# Patient Record
Sex: Female | Born: 1950 | Race: White | Hispanic: No | Smoking: Never smoker
Health system: Southern US, Community
[De-identification: ages and names within clinical notes are randomized; demographics above are authoritative.]

## PROBLEM LIST (undated history)

## (undated) DIAGNOSIS — F419 Anxiety disorder, unspecified: Secondary | ICD-10-CM

## (undated) DIAGNOSIS — J189 Pneumonia, unspecified organism: Secondary | ICD-10-CM

## (undated) DIAGNOSIS — M797 Fibromyalgia: Secondary | ICD-10-CM

## (undated) DIAGNOSIS — F909 Attention-deficit hyperactivity disorder, unspecified type: Secondary | ICD-10-CM

## (undated) DIAGNOSIS — R112 Nausea with vomiting, unspecified: Secondary | ICD-10-CM

## (undated) DIAGNOSIS — M75100 Unspecified rotator cuff tear or rupture of unspecified shoulder, not specified as traumatic: Secondary | ICD-10-CM

## (undated) DIAGNOSIS — R519 Headache, unspecified: Secondary | ICD-10-CM

## (undated) DIAGNOSIS — M1990 Unspecified osteoarthritis, unspecified site: Secondary | ICD-10-CM

## (undated) DIAGNOSIS — R51 Headache: Secondary | ICD-10-CM

## (undated) DIAGNOSIS — Z9889 Other specified postprocedural states: Secondary | ICD-10-CM

## (undated) DIAGNOSIS — M545 Low back pain, unspecified: Secondary | ICD-10-CM

## (undated) DIAGNOSIS — G8929 Other chronic pain: Secondary | ICD-10-CM

## (undated) HISTORY — PX: APPENDECTOMY: SHX54

## (undated) HISTORY — PX: CYST EXCISION: SHX5701

## (undated) HISTORY — PX: TONSILLECTOMY: SUR1361

## (undated) HISTORY — PX: DILATION AND CURETTAGE OF UTERUS: SHX78

## (undated) HISTORY — PX: BACK SURGERY: SHX140

## (undated) HISTORY — PX: CARPAL TUNNEL RELEASE: SHX101

## (undated) HISTORY — PX: VAGINAL HYSTERECTOMY: SUR661

## (undated) HISTORY — PX: CYSTECTOMY: SUR359

## (undated) HISTORY — PX: TUBAL LIGATION: SHX77

## (undated) HISTORY — PX: SHOULDER OPEN ROTATOR CUFF REPAIR: SHX2407

---

## 2012-09-20 ENCOUNTER — Encounter (HOSPITAL_COMMUNITY): Payer: Self-pay | Admitting: Pharmacy Technician

## 2012-09-24 NOTE — Pre-Procedure Instructions (Signed)
Laura Solis  09/24/2012   Your procedure is scheduled on:  10/02/12  Report to [facility] at 530 AM.  Call this number if you have problems the morning of surgery: [phone]   Remember:   Do not eat food or drink liquids after midnight.   Take these medicines the morning of surgery with A SIP OF WATER: xanax,adderall,hydrocodone   Do not wear jewelry, make-up or nail polish.  Do not wear lotions, powders, or perfumes. You may wear deodorant.  Do not shave 48 hours prior to surgery. Men may shave face and neck.  Do not bring valuables to the hospital.  Contacts, dentures or bridgework may not be worn into surgery.  Leave suitcase in the car. After surgery it may be brought to your room.  For patients admitted to the hospital, checkout time is 11:00 AM the day of  discharge.   Patients discharged the day of surgery will not be allowed to drive  home.  Name and phone number of your driver: family  Special Instructions: Shower using CHG 2 nights before surgery and the night before surgery.  If you shower the day of surgery use CHG.  Use special wash - you have one bottle of CHG for all showers.  You should use approximately 1/3 of the bottle for each shower.   Please read over the following fact sheets that you were given: Pain Booklet, Total Joint Packet, MRSA Information and Surgical Site Infection Prevention

## 2012-09-25 ENCOUNTER — Encounter (HOSPITAL_COMMUNITY)
Admission: RE | Admit: 2012-09-25 | Discharge: 2012-09-25 | Disposition: A | Discharge status: Home / Self Care | Payer: PRIVATE HEALTH INSURANCE | Source: Ambulatory Visit | Attending: Orthopedic Surgery | Admitting: Orthopedic Surgery

## 2012-09-25 ENCOUNTER — Encounter (HOSPITAL_COMMUNITY)
Admission: RE | Admit: 2012-09-25 | Discharge: 2012-09-25 | Disposition: A | Discharge status: Home / Self Care | Payer: PRIVATE HEALTH INSURANCE | Source: Ambulatory Visit | Attending: Orthopaedic Surgery | Admitting: Orthopaedic Surgery

## 2012-09-25 ENCOUNTER — Encounter (HOSPITAL_COMMUNITY): Payer: Self-pay

## 2012-09-25 HISTORY — DX: Anxiety disorder, unspecified: F41.9

## 2012-09-25 HISTORY — DX: Fibromyalgia: M79.7

## 2012-09-25 HISTORY — DX: Attention-deficit hyperactivity disorder, unspecified type: F90.9

## 2012-09-25 HISTORY — DX: Other specified postprocedural states: R11.2

## 2012-09-25 HISTORY — DX: Other specified postprocedural states: Z98.890

## 2012-09-25 LAB — URINALYSIS, ROUTINE W REFLEX MICROSCOPIC
Glucose, UA: NEGATIVE mg/dL
Leukocytes, UA: NEGATIVE
Nitrite: NEGATIVE
Protein, ur: NEGATIVE mg/dL
Urobilinogen, UA: 0.2 mg/dL (ref 0.0–1.0)

## 2012-09-25 LAB — ABO/RH: ABO/RH(D): A POS

## 2012-09-25 LAB — COMPREHENSIVE METABOLIC PANEL
ALT: 24 U/L (ref 0–35)
AST: 25 U/L (ref 0–37)
Albumin: 3.8 g/dL (ref 3.5–5.2)
CO2: 30 mEq/L (ref 19–32)
Calcium: 9.7 mg/dL (ref 8.4–10.5)
Chloride: 99 mEq/L (ref 96–112)
GFR calc non Af Amer: >90 mL/min (ref >90)
Sodium: 139 mEq/L (ref 135–145)

## 2012-09-25 LAB — SURGICAL PCR SCREEN: MRSA, PCR: POSITIVE — AB

## 2012-09-25 LAB — CBC
MCH: 32.4 pg (ref 26.0–34.0)
Platelets: 262 10*3/uL (ref 150–400)
RBC: 4.51 MIL/uL (ref 3.87–5.11)
WBC: 6.7 10*3/uL (ref 4.0–10.5)

## 2012-09-25 LAB — TYPE AND SCREEN: ABO/RH(D): A POS

## 2012-09-25 NOTE — Progress Notes (Signed)
Pt sees dr Michel Santee  [phone]

## 2012-09-27 NOTE — H&P (Signed)
CHIEF COMPLAINT:  Painful right knee.    HISTORY OF PRESENT ILLNESS:  Laura Solis is a very pleasant 62 year old white female who is seen today for evaluation of her right knee.  History is that she has been having persistent right knee pain, which appears to be worsening.  In February of 2011, she did have a lateral meniscectomy, a 3 compartment chondroplasty performed by Dr. Priscille Solis.  She had rather significant OA at the time as well as the tear, lateral meniscus.  He did chondroplasty of all 3 joints.  Over time, though, she has been having increasing pain and discomfort in the right knee to the point now where she cannot straighten the knee out fully and she is also having a significant arthritic aching-type pain as well as sharp stabbing pains.  She is now having problems with activities of daily living as well as with sleep.  Activity worsens her symptoms.  Rest has been somewhat helpful.  She is taking hydrocodone 10/325 up to 3 a day as well as Xanax and Cymbalta.  She does have a history of fibromyalgia but this pain is certainly more debilitating in comparison to her fibromyalgia.  She has been tried with corticosteroid injections as well as viscosupplementation and is presently having less effect.  Seen today for evaluation.     PAST MEDICAL HISTORY:  In general, health is fair.  She also has 2 children and was hospitalized for that as well as pneumonia.   PAST SURGICAL HISTORY:  Includes hysterectomy, bilateral carpal tunnel releases, bilateral rotator cuff tear repair, excision of a growth posterior and inferior to her right ear, pilonidal cystectomy, tubal ligation and appendectomy.       CURRENT MEDICATIONS:  Include that of: 1.  Hydrocodone 10/350 one p.o. b.i.d. to t.i.d.   2.  Xanax 0.5 mg p.r.n.  3.  Cymbalta. 4.  Adderall 20 mg b.i.d.  5.  Pramipexole dihydrochloride 0.125.     ALLERGIES:  NONE KNOWN.    FAMILY HISTORY:  Positive for a mother who remains alive at age 11, who has had  high blood pressure, stroke x1, and ovarian cancer.  Father is alive at age 52 and does have gout and hypertension.  Brothers, none.  Sisters, she has one, age 73, and is fairly healthy.     SOCIAL HISTORY:  She is a 62 year old white, divorced, disabled female.  Denies use of tobacco or alcohol.     REVIEW OF SYSTEMS:  A 14-point review of systems is positive for upper partial.  She has had pneumonia and bronchitis about 3-4 years ago.  None recent.  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  Leg cramps are also noted.  She complains of chronic diarrhea and has been told to her by her medical physician this on the basis of her medications that she takes.  She does have internal hemorrhoids.  Her last kidney infection was 2 years ago.  She does have fibromyalgia and does get a fair amount of headaches as well as hot spells, depression, nervous exhaustion, nervous tension.  She also has a borderline study for sleep apnea.     PHYSICAL EXAMINATION:  A 62 year old white female, well developed, well nourished, obese, very pleasant, cooperative, in moderate distress secondary to right knee pain.  She is 5 feet, 5 inches.  Weighs 209 pounds.  BMI is 34.8.  Vital signs:  Temperature 98.8, pulse 89, respirations 18, blood pressure 131/76.  Head:  Normocephalic.  Eyes:  Pupils equal,  round and reactive to light and accomodation.  Extraocular movements intact.  Ears, nose, throat:  Benign.  Neck:  Supple.  No thyromegaly.  No bruits.  Chest:  Good expansion.  Lungs:  Clear.  Cardiac:  Regular rhythm and rate.  Normal S1, S2.  Distant heart sounds.  No murmurs were noted.  Pulses:  Trace to 1+, bilateral and symmetric in the lower extremities.  Abdomen:  Obese, soft, nontender.  No masses palpable.  Normal bowel sounds present.  Genitorectal/breast:  Not indicated for an evaluation orthopedically.  CNS:  Oriented x3.  Cranial nerves II-XII grossly intact.  Musculoskeletal:  She has range of motion today from about 5  degrees shy of full extension to about 110 degrees.  She does have some opening with varus and valgus stressing.  She does have normal sensation.  Trace to 1+ effusion.  Calf is supple, nontender.     RADIOGRAPHS:  Three-view knee reveals patellofemoral OA.  There is sclerosis of the medial femoral condyle and medial tibial plateau with periarticular spurrings more medial than lateral.  She has patellar spurring also.     CLINICAL IMPRESSION:   1.  End-stage OA of the right knee.  2.  Possible sleep apnea, borderline on study.  3.  Fibromyalgia. 4.  Chronic use of hydrocodone.    RECOMMENDATIONS:   1.  At this time, I have reviewed a form from Laura Solis in regard to Laura Solis where he feels that she is medically stable for total knee replacement.   2.  We will proceed with scheduling her for a right total knee arthroplasty.  Procedure risks and benefits were reviewed in detail and she is understanding.  Models were used to show the procedure.  All questions were answered in detail.  She will proceed with total knee replacement in the near future.    Laura Drone Aleda Grana [facility] [phone]  10/01/2012 4:04 PM

## 2012-10-01 MED ORDER — SODIUM CHLORIDE 0.9 % IV SOLN: INTRAVENOUS | Status: DC

## 2012-10-01 MED ORDER — CHLORHEXIDINE GLUCONATE 4 % EX LIQD: 60.0000 mL | Freq: Every day | CUTANEOUS | Status: DC

## 2012-10-01 MED ORDER — CHLORHEXIDINE GLUCONATE 4 % EX LIQD
60.0000 mL | Freq: Once | CUTANEOUS | Status: DC
Start: 2012-10-01 — End: 2012-10-02

## 2012-10-01 MED ORDER — CEFAZOLIN SODIUM-DEXTROSE 2-3 GM-% IV SOLR
2.0000 g | INTRAVENOUS | Status: AC
  Administered 2012-10-02: 2 g via INTRAVENOUS
  Filled 2012-10-01: qty 50

## 2012-10-02 ENCOUNTER — Inpatient Hospital Stay (HOSPITAL_COMMUNITY): Payer: PRIVATE HEALTH INSURANCE | Admitting: Anesthesiology

## 2012-10-02 ENCOUNTER — Inpatient Hospital Stay (HOSPITAL_COMMUNITY)
Admission: RE | Admit: 2012-10-02 | Discharge: 2012-10-04 | DRG: 470 | Disposition: A | Discharge status: Skilled Nursing Facility | Payer: PRIVATE HEALTH INSURANCE | Source: Ambulatory Visit | Attending: Orthopaedic Surgery | Admitting: Orthopaedic Surgery

## 2012-10-02 ENCOUNTER — Encounter (HOSPITAL_COMMUNITY): Payer: Self-pay | Admitting: Anesthesiology

## 2012-10-02 ENCOUNTER — Encounter (HOSPITAL_COMMUNITY)
Admission: RE | Disposition: A | Discharge status: Skilled Nursing Facility | Payer: Self-pay | Source: Ambulatory Visit | Attending: Orthopaedic Surgery

## 2012-10-02 DIAGNOSIS — E66811 Obesity, class 1: Secondary | ICD-10-CM | POA: Diagnosis present

## 2012-10-02 DIAGNOSIS — Z8249 Family history of ischemic heart disease and other diseases of the circulatory system: Secondary | ICD-10-CM

## 2012-10-02 DIAGNOSIS — IMO0001 Reserved for inherently not codable concepts without codable children: Secondary | ICD-10-CM | POA: Diagnosis present

## 2012-10-02 DIAGNOSIS — Z823 Family history of stroke: Secondary | ICD-10-CM

## 2012-10-02 DIAGNOSIS — F909 Attention-deficit hyperactivity disorder, unspecified type: Secondary | ICD-10-CM | POA: Diagnosis present

## 2012-10-02 DIAGNOSIS — Z01812 Encounter for preprocedural laboratory examination: Secondary | ICD-10-CM

## 2012-10-02 DIAGNOSIS — Z79899 Other long term (current) drug therapy: Secondary | ICD-10-CM

## 2012-10-02 DIAGNOSIS — M1710 Unilateral primary osteoarthritis, unspecified knee: Principal | ICD-10-CM | POA: Diagnosis present

## 2012-10-02 DIAGNOSIS — E669 Obesity, unspecified: Secondary | ICD-10-CM | POA: Diagnosis present

## 2012-10-02 DIAGNOSIS — F411 Generalized anxiety disorder: Secondary | ICD-10-CM | POA: Diagnosis present

## 2012-10-02 DIAGNOSIS — E876 Hypokalemia: Secondary | ICD-10-CM | POA: Diagnosis not present

## 2012-10-02 DIAGNOSIS — Z7901 Long term (current) use of anticoagulants: Secondary | ICD-10-CM

## 2012-10-02 DIAGNOSIS — M797 Fibromyalgia: Secondary | ICD-10-CM | POA: Diagnosis present

## 2012-10-02 DIAGNOSIS — M1711 Unilateral primary osteoarthritis, right knee: Secondary | ICD-10-CM | POA: Diagnosis present

## 2012-10-02 HISTORY — DX: Pneumonia, unspecified organism: J18.9

## 2012-10-02 HISTORY — PX: TOTAL KNEE ARTHROPLASTY: SHX125

## 2012-10-02 SURGERY — ARTHROPLASTY, KNEE, TOTAL
Anesthesia: General | Site: Knee | Laterality: Right | Wound class: Clean

## 2012-10-02 MED ORDER — OXYCODONE HCL 5 MG PO TABS: 5.0000 mg | ORAL_TABLET | Freq: Once | ORAL | Status: DC | PRN

## 2012-10-02 MED ORDER — LACTATED RINGERS IV SOLN
INTRAVENOUS | Status: DC | PRN
  Administered 2012-10-02 (×3): via INTRAVENOUS

## 2012-10-02 MED ORDER — ONDANSETRON HCL 4 MG/2ML IJ SOLN: 4.0000 mg | Freq: Four times a day (QID) | INTRAMUSCULAR | Status: DC | PRN

## 2012-10-02 MED ORDER — CHLORHEXIDINE GLUCONATE CLOTH 2 % EX PADS
6.0000 | MEDICATED_PAD | Freq: Every day | CUTANEOUS | Status: DC
  Administered 2012-10-03 – 2012-10-04 (×2): 6 via TOPICAL

## 2012-10-02 MED ORDER — ALPRAZOLAM 0.25 MG PO TABS: 0.2500 mg | ORAL_TABLET | Freq: Two times a day (BID) | ORAL | Status: DC | PRN

## 2012-10-02 MED ORDER — HYDROMORPHONE HCL PF 1 MG/ML IJ SOLN
INTRAMUSCULAR | Status: AC
  Filled 2012-10-02: qty 1

## 2012-10-02 MED ORDER — METHOCARBAMOL 500 MG PO TABS
500.0000 mg | ORAL_TABLET | Freq: Four times a day (QID) | ORAL | Status: DC | PRN
  Administered 2012-10-03: 500 mg via ORAL
  Filled 2012-10-02: qty 1

## 2012-10-02 MED ORDER — MENTHOL 3 MG MT LOZG: 1.0000 | LOZENGE | OROMUCOSAL | Status: DC | PRN

## 2012-10-02 MED ORDER — SODIUM CHLORIDE 0.9 % IR SOLN
Status: DC | PRN
  Administered 2012-10-02: 3000 mL
  Administered 2012-10-02: 1000 mL

## 2012-10-02 MED ORDER — BUPIVACAINE-EPINEPHRINE 0.25% -1:200000 IJ SOLN
INTRAMUSCULAR | Status: DC | PRN
  Administered 2012-10-02: 30 mL

## 2012-10-02 MED ORDER — ACETAMINOPHEN 10 MG/ML IV SOLN
1000.0000 mg | Freq: Four times a day (QID) | INTRAVENOUS | Status: AC
  Administered 2012-10-02 – 2012-10-03 (×4): 1000 mg via INTRAVENOUS
  Filled 2012-10-02 (×4): qty 100

## 2012-10-02 MED ORDER — ONDANSETRON HCL 4 MG PO TABS: 4.0000 mg | ORAL_TABLET | Freq: Four times a day (QID) | ORAL | Status: DC | PRN

## 2012-10-02 MED ORDER — METHOCARBAMOL 100 MG/ML IJ SOLN
500.0000 mg | Freq: Four times a day (QID) | INTRAMUSCULAR | Status: DC | PRN
  Filled 2012-10-02: qty 5

## 2012-10-02 MED ORDER — DEXAMETHASONE SODIUM PHOSPHATE 4 MG/ML IJ SOLN
INTRAMUSCULAR | Status: DC | PRN
  Administered 2012-10-02: 8 mg

## 2012-10-02 MED ORDER — RIVAROXABAN 10 MG PO TABS
10.0000 mg | ORAL_TABLET | ORAL | Status: DC
  Administered 2012-10-02 – 2012-10-03 (×2): 10 mg via ORAL
  Filled 2012-10-02 (×5): qty 1

## 2012-10-02 MED ORDER — ONDANSETRON HCL 4 MG/2ML IJ SOLN
INTRAMUSCULAR | Status: DC | PRN
  Administered 2012-10-02: 4 mg via INTRAVENOUS

## 2012-10-02 MED ORDER — MIDAZOLAM HCL 5 MG/5ML IJ SOLN
INTRAMUSCULAR | Status: DC | PRN
  Administered 2012-10-02: 2 mg via INTRAVENOUS

## 2012-10-02 MED ORDER — THROMBIN 20000 UNITS EX KIT
PACK | CUTANEOUS | Status: AC
  Filled 2012-10-02: qty 1

## 2012-10-02 MED ORDER — ACETAMINOPHEN 10 MG/ML IV SOLN
1000.0000 mg | Freq: Once | INTRAVENOUS | Status: AC
  Administered 2012-10-02: 1000 mg via INTRAVENOUS
  Filled 2012-10-02: qty 100

## 2012-10-02 MED ORDER — HYDROMORPHONE HCL PF 1 MG/ML IJ SOLN
0.5000 mg | INTRAMUSCULAR | Status: DC | PRN
  Administered 2012-10-02: 0.5 mg via INTRAVENOUS
  Filled 2012-10-02: qty 1

## 2012-10-02 MED ORDER — ALUM & MAG HYDROXIDE-SIMETH 200-200-20 MG/5ML PO SUSP: 30.0000 mL | ORAL | Status: DC | PRN

## 2012-10-02 MED ORDER — KETOROLAC TROMETHAMINE 30 MG/ML IJ SOLN
INTRAMUSCULAR | Status: AC
  Filled 2012-10-02: qty 1

## 2012-10-02 MED ORDER — OXYCODONE HCL 5 MG PO TABS
5.0000 mg | ORAL_TABLET | ORAL | Status: DC | PRN
  Administered 2012-10-03 – 2012-10-04 (×3): 10 mg via ORAL
  Filled 2012-10-02 (×3): qty 2

## 2012-10-02 MED ORDER — PROMETHAZINE HCL 25 MG/ML IJ SOLN: 6.2500 mg | INTRAMUSCULAR | Status: DC | PRN

## 2012-10-02 MED ORDER — DOCUSATE SODIUM 100 MG PO CAPS
100.0000 mg | ORAL_CAPSULE | Freq: Two times a day (BID) | ORAL | Status: DC
  Administered 2012-10-02 – 2012-10-04 (×5): 100 mg via ORAL
  Filled 2012-10-02 (×6): qty 1

## 2012-10-02 MED ORDER — CEFAZOLIN SODIUM-DEXTROSE 2-3 GM-% IV SOLR
2.0000 g | Freq: Four times a day (QID) | INTRAVENOUS | Status: AC
  Administered 2012-10-02 (×2): 2 g via INTRAVENOUS
  Filled 2012-10-02 (×2): qty 50

## 2012-10-02 MED ORDER — MUPIROCIN 2 % EX OINT
1.0000 "application " | TOPICAL_OINTMENT | Freq: Two times a day (BID) | CUTANEOUS | Status: DC
  Administered 2012-10-02 – 2012-10-04 (×4): 1 via NASAL
  Filled 2012-10-02: qty 22

## 2012-10-02 MED ORDER — KETOROLAC TROMETHAMINE 15 MG/ML IJ SOLN
15.0000 mg | Freq: Four times a day (QID) | INTRAMUSCULAR | Status: AC
  Administered 2012-10-02 (×2): 15 mg via INTRAVENOUS
  Filled 2012-10-02 (×3): qty 1

## 2012-10-02 MED ORDER — OXYCODONE HCL 5 MG/5ML PO SOLN
5.0000 mg | Freq: Once | ORAL | Status: DC | PRN
Start: 2012-10-02 — End: 2012-10-02

## 2012-10-02 MED ORDER — FENTANYL CITRATE 0.05 MG/ML IJ SOLN
INTRAMUSCULAR | Status: DC | PRN
  Administered 2012-10-02 (×3): 50 ug via INTRAVENOUS
  Administered 2012-10-02: 100 ug via INTRAVENOUS

## 2012-10-02 MED ORDER — PHENOL 1.4 % MT LIQD: 1.0000 | OROMUCOSAL | Status: DC | PRN

## 2012-10-02 MED ORDER — ACETAMINOPHEN 10 MG/ML IV SOLN
INTRAVENOUS | Status: AC
  Filled 2012-10-02: qty 100

## 2012-10-02 MED ORDER — SODIUM CHLORIDE 0.9 % IV SOLN
75.0000 mL/h | INTRAVENOUS | Status: DC
  Administered 2012-10-02: 75 mL/h via INTRAVENOUS

## 2012-10-02 MED ORDER — METOCLOPRAMIDE HCL 5 MG/ML IJ SOLN: 5.0000 mg | Freq: Three times a day (TID) | INTRAMUSCULAR | Status: DC | PRN

## 2012-10-02 MED ORDER — PRAMIPEXOLE DIHYDROCHLORIDE 0.25 MG PO TABS
0.2500 mg | ORAL_TABLET | Freq: Every day | ORAL | Status: DC
  Administered 2012-10-03: 0.25 mg via ORAL
  Filled 2012-10-02 (×3): qty 1

## 2012-10-02 MED ORDER — FLEET ENEMA 7-19 GM/118ML RE ENEM: 1.0000 | ENEMA | Freq: Once | RECTAL | Status: AC | PRN

## 2012-10-02 MED ORDER — MORPHINE SULFATE 10 MG/ML IJ SOLN
INTRAMUSCULAR | Status: DC | PRN
  Administered 2012-10-02 (×2): 5 mg via INTRAVENOUS

## 2012-10-02 MED ORDER — HYDROMORPHONE HCL PF 1 MG/ML IJ SOLN
0.2500 mg | INTRAMUSCULAR | Status: DC | PRN
  Administered 2012-10-02: 0.25 mg via INTRAVENOUS
  Administered 2012-10-02: 0.5 mg via INTRAVENOUS
  Administered 2012-10-02: 0.25 mg via INTRAVENOUS

## 2012-10-02 MED ORDER — THROMBIN 20000 UNITS EX KIT
PACK | CUTANEOUS | Status: DC | PRN
  Administered 2012-10-02: 20000 [IU] via TOPICAL

## 2012-10-02 MED ORDER — MAGNESIUM HYDROXIDE 400 MG/5ML PO SUSP: 30.0000 mL | Freq: Every day | ORAL | Status: DC | PRN

## 2012-10-02 MED ORDER — METOCLOPRAMIDE HCL 10 MG PO TABS: 5.0000 mg | ORAL_TABLET | Freq: Three times a day (TID) | ORAL | Status: DC | PRN

## 2012-10-02 MED ORDER — BUPIVACAINE-EPINEPHRINE PF 0.5-1:200000 % IJ SOLN
INTRAMUSCULAR | Status: DC | PRN
  Administered 2012-10-02: 150 mg

## 2012-10-02 MED ORDER — DULOXETINE HCL 60 MG PO CPEP
60.0000 mg | ORAL_CAPSULE | Freq: Every day | ORAL | Status: DC
  Administered 2012-10-02 – 2012-10-03 (×2): 60 mg via ORAL
  Filled 2012-10-02 (×4): qty 1

## 2012-10-02 MED ORDER — LABETALOL HCL 5 MG/ML IV SOLN
INTRAVENOUS | Status: DC | PRN
  Administered 2012-10-02: 5 mg via INTRAVENOUS

## 2012-10-02 MED ORDER — AMPHETAMINE-DEXTROAMPHETAMINE 10 MG PO TABS
10.0000 mg | ORAL_TABLET | Freq: Two times a day (BID) | ORAL | Status: DC
  Administered 2012-10-03: 10 mg via ORAL
  Administered 2012-10-04: 20 mg via ORAL
  Filled 2012-10-02: qty 2
  Filled 2012-10-02: qty 1

## 2012-10-02 MED ORDER — ARTIFICIAL TEARS OP OINT
TOPICAL_OINTMENT | OPHTHALMIC | Status: DC | PRN
  Administered 2012-10-02: 1 via OPHTHALMIC

## 2012-10-02 MED ORDER — BISACODYL 10 MG RE SUPP: 10.0000 mg | Freq: Every day | RECTAL | Status: DC | PRN

## 2012-10-02 MED ORDER — PROPOFOL 10 MG/ML IV BOLUS
INTRAVENOUS | Status: DC | PRN
  Administered 2012-10-02: 200 mg via INTRAVENOUS

## 2012-10-02 SURGICAL SUPPLY — 57 items
BANDAGE ESMARK 6X9 LF (GAUZE/BANDAGES/DRESSINGS) ×1 IMPLANT
BLADE SAGITTAL 25.0X1.19X90 (BLADE) ×2 IMPLANT
BNDG ESMARK 6X9 LF (GAUZE/BANDAGES/DRESSINGS) ×2
BOWL SMART MIX CTS (DISPOSABLE) ×2 IMPLANT
CEMENT HV SMART SET (Cement) ×4 IMPLANT
CLOTH BEACON ORANGE TIMEOUT ST (SAFETY) ×2 IMPLANT
COVER BACK TABLE 24X17X13 BIG (DRAPES) ×2 IMPLANT
COVER SURGICAL LIGHT HANDLE (MISCELLANEOUS) ×2 IMPLANT
CUFF TOURNIQUET SINGLE 34IN LL (TOURNIQUET CUFF) ×2 IMPLANT
CUFF TOURNIQUET SINGLE 44IN (TOURNIQUET CUFF) IMPLANT
DRAPE EXTREMITY T 121X128X90 (DRAPE) ×2 IMPLANT
DRAPE PROXIMA HALF (DRAPES) ×2 IMPLANT
DRSG ADAPTIC 3X8 NADH LF (GAUZE/BANDAGES/DRESSINGS) ×2 IMPLANT
DRSG PAD ABDOMINAL 8X10 ST (GAUZE/BANDAGES/DRESSINGS) ×4 IMPLANT
DURAPREP 26ML APPLICATOR (WOUND CARE) ×2 IMPLANT
ELECT CAUTERY BLADE 6.4 (BLADE) ×2 IMPLANT
ELECT REM PT RETURN 9FT ADLT (ELECTROSURGICAL) ×2
ELECTRODE REM PT RTRN 9FT ADLT (ELECTROSURGICAL) ×1 IMPLANT
EVACUATOR 1/8 PVC DRAIN (DRAIN) IMPLANT
FACESHIELD LNG OPTICON STERILE (SAFETY) ×4 IMPLANT
FLOSEAL 10ML (HEMOSTASIS) IMPLANT
GLOVE BIOGEL PI IND STRL 8 (GLOVE) ×1 IMPLANT
GLOVE BIOGEL PI IND STRL 8.5 (GLOVE) ×1 IMPLANT
GLOVE BIOGEL PI INDICATOR 8 (GLOVE) ×1
GLOVE BIOGEL PI INDICATOR 8.5 (GLOVE) ×1
GLOVE ECLIPSE 8.0 STRL XLNG CF (GLOVE) ×4 IMPLANT
GLOVE SURG ORTHO 8.5 STRL (GLOVE) ×2 IMPLANT
GOWN PREVENTION PLUS XXLARGE (GOWN DISPOSABLE) ×2 IMPLANT
GOWN STRL NON-REIN LRG LVL3 (GOWN DISPOSABLE) ×4 IMPLANT
HANDPIECE INTERPULSE COAX TIP (DISPOSABLE) ×1
KIT BASIN OR (CUSTOM PROCEDURE TRAY) ×2 IMPLANT
KIT ROOM TURNOVER OR (KITS) ×2 IMPLANT
MANIFOLD NEPTUNE II (INSTRUMENTS) ×2 IMPLANT
NEEDLE 22X1 1/2 (OR ONLY) (NEEDLE) ×2 IMPLANT
NS IRRIG 1000ML POUR BTL (IV SOLUTION) ×2 IMPLANT
PACK TOTAL JOINT (CUSTOM PROCEDURE TRAY) ×2 IMPLANT
PAD ARMBOARD 7.5X6 YLW CONV (MISCELLANEOUS) ×4 IMPLANT
PAD CAST 4YDX4 CTTN HI CHSV (CAST SUPPLIES) ×1 IMPLANT
PADDING CAST COTTON 4X4 STRL (CAST SUPPLIES) ×1
PADDING CAST COTTON 6X4 STRL (CAST SUPPLIES) ×2 IMPLANT
SET HNDPC FAN SPRY TIP SCT (DISPOSABLE) ×1 IMPLANT
SPONGE GAUZE 4X4 12PLY (GAUZE/BANDAGES/DRESSINGS) ×2 IMPLANT
STAPLER VISISTAT 35W (STAPLE) ×2 IMPLANT
SUCTION FRAZIER TIP 10 FR DISP (SUCTIONS) ×2 IMPLANT
SUT BONE WAX W31G (SUTURE) ×2 IMPLANT
SUT ETHIBOND NAB CT1 #1 30IN (SUTURE) ×8 IMPLANT
SUT MNCRL AB 3-0 PS2 18 (SUTURE) ×2 IMPLANT
SUT VIC AB 0 CT1 27 (SUTURE) ×1
SUT VIC AB 0 CT1 27XBRD ANBCTR (SUTURE) ×1 IMPLANT
SUT VIC AB 1 CT1 27 (SUTURE) ×1
SUT VIC AB 1 CT1 27XBRD ANBCTR (SUTURE) ×1 IMPLANT
SYR CONTROL 10ML LL (SYRINGE) IMPLANT
TOWEL OR 17X24 6PK STRL BLUE (TOWEL DISPOSABLE) ×2 IMPLANT
TOWEL OR 17X26 10 PK STRL BLUE (TOWEL DISPOSABLE) ×2 IMPLANT
TRAY FOLEY CATH 14FR (SET/KITS/TRAYS/PACK) ×2 IMPLANT
WATER STERILE IRR 1000ML POUR (IV SOLUTION) ×4 IMPLANT
WRAP KNEE MAXI GEL POST OP (GAUZE/BANDAGES/DRESSINGS) ×2 IMPLANT

## 2012-10-02 NOTE — Evaluation (Signed)
Physical Therapy Evaluation Patient Details Name: Laura Solis MRN: [id number] DOB: 23-Sep-1950 Today's Date: 10/02/2012 Time: 4098-1191 PT Time Calculation (min): 47 min  PT Assessment / Plan / Recommendation Clinical Impression  Pt is a 62 y/o female s/p R TKA. Pt lives with her 18 y/o granddaughter and has no other consistent caregiver.  Pt planning to d/c to short term SNF.   Acute PT agrees with d/c plan.  Acute PT to follow pt to progress functional mobility.     PT Assessment  Patient needs continued PT services    Follow Up Recommendations  SNF;Supervision/Assistance - 24 hour    Does the patient have the potential to tolerate intense rehabilitation      Barriers to Discharge Decreased caregiver support No caregiver most of the day.   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 and unable to provide any more than supervision.      Equipment Recommendations  Rolling walker with 5" wheels    Recommendations for Other Services     Frequency 7X/week    Precautions / Restrictions Precautions Precautions: Knee Precaution Comments: Educated pt on positioning R knee to promote extension.   Restrictions Weight Bearing Restrictions: Yes RLE Weight Bearing: Partial weight bearing RLE Partial Weight Bearing Percentage or Pounds: 50%   Pertinent Vitals/Pain 2/10 pain in R knee. Pt medicated prior to session. Pt resting comfortably at end of session.        Mobility  Bed Mobility Bed Mobility: Supine to Sit;Sitting - Scoot to Edge of Bed Supine to Sit: 4: Min assist;HOB flat;With rails Sitting - Scoot to Edge of Bed: 4: Min assist;With rail Details for Bed Mobility Assistance: assist to manage RLE.   Transfers Transfers: Sit to Stand;Stand to [employer] Transfers Sit to Stand: 4: Min assist;From bed;With upper extremity assist Stand to Sit: 4: Min assist;To chair/3-in-1;With upper extremity assist Stand Pivot Transfers: 3: Mod assist Details for Transfer Assistance: VCs for  hand placement to stand and sit, Assist to steady pt to stand and manual facilitation to control descent to chair.  Vc for movment sequencing for stand pivot transfer.  Assist to steady pt secondary to R knee buckle in R stance phase.   Ambulation/Gait Ambulation/Gait Assistance: 2: Max assist Ambulation Distance (Feet): 2 Feet Assistive device: Rolling walker Ambulation/Gait Assistance Details: Pt required manual facilitation to stedy pt during L sing phase of gait secondary to R LE weakness.  VC for pt to increase wb in UEs did not improve the amount of buckle in the R knee.   Gait Pattern: Step-to pattern;Decreased stride length;Decreased stance time - right;Decreased weight shift to right Stairs: No Wheelchair Mobility Wheelchair Mobility: No    Exercises Total Joint Exercises Ankle Circles/Pumps: Both;5 reps Quad Sets: Right;5 reps;Seated;AROM   PT Diagnosis: Difficulty walking;Generalized weakness;Acute pain  PT Problem List: Decreased range of motion;Decreased strength;Decreased activity tolerance;Decreased mobility;Decreased knowledge of use of DME;Decreased knowledge of precautions;Pain PT Treatment Interventions: Gait training;DME instruction;Therapeutic activities;Functional mobility training;Therapeutic exercise;Patient/family education   PT Goals Acute Rehab PT Goals PT Goal Formulation: With patient Time For Goal Achievement: 10/16/12 Potential to Achieve Goals: Good Pt will go Supine/Side to Sit: Independently PT Goal: Supine/Side to Sit - Progress: Goal set today Pt will go Sit to Supine/Side: Independently PT Goal: Sit to Supine/Side - Progress: Goal set today Pt will Transfer Bed to Chair/Chair to Bed: with modified independence PT Transfer Goal: Bed to Chair/Chair to Bed - Progress: Goal set today Pt will Ambulate: >150 feet;with modified independence;with  least restrictive assistive device PT Goal: Ambulate - Progress: Goal set today Pt will Perform Home Exercise  Program: Independently PT Goal: Perform Home Exercise Program - Progress: Goal set today  Visit Information  Last PT Received On: 10/02/12    Subjective Data  Subjective: Agree to PT eval Patient Stated Goal: Walk without pain.    Prior Functioning  Home Living Lives With: Other (Comment) Available Help at Discharge: Skilled Nursing Facility Type of Home: Mobile home Prior Function Level of Independence: Independent Able to Take Stairs?: Yes Driving: Yes Vocation: On disability Communication Communication: No difficulties    Cognition  Cognition Overall Cognitive Status: Appears within functional limits for tasks assessed/performed Arousal/Alertness: Awake/alert Orientation Level: Oriented X4 / Intact    Extremity/Trunk Assessment Right Upper Extremity Assessment RUE ROM/Strength/Tone: Deficits;WFL for tasks assessed RUE ROM/Strength/Tone Deficits: Pt repoting bilateral should OA and history of bilatera RTC repairs as well as carpel tunnel surgery bilaterally.   Left Upper Extremity Assessment LUE ROM/Strength/Tone: [facility] for tasks assessed Right Lower Extremity Assessment RLE ROM/Strength/Tone: Unable to fully assess;Due to pain;Deficits RLE ROM/Strength/Tone Deficits: Unable to accurately assess strength secondary to nerve block in RLE.   Left Lower Extremity Assessment LLE ROM/Strength/Tone: Within functional levels Trunk Assessment Trunk Assessment: Normal   Balance Balance Balance Assessed: Yes Static Sitting Balance Static Sitting - Balance Support: No upper extremity supported;Feet supported Static Sitting - Level of Assistance: 5: Stand by assistance Static Sitting - Comment/# of Minutes: Pt c/o feeling hazy but able to continue.    End of Session PT - End of Session Equipment Utilized During Treatment: Gait belt Activity Tolerance: Patient tolerated treatment well Patient left: in chair CPM Right Knee CPM Right Knee: Off Additional Comments:  CPM off at  1620  GP     Laura Solis 10/02/2012, 5:15 PM Laura Solis L. Laura Solis DPT [phone]

## 2012-10-02 NOTE — Progress Notes (Signed)
UR COMPLETED  

## 2012-10-02 NOTE — Anesthesia Procedure Notes (Signed)
Anesthesia Regional Block:  Femoral nerve block  Pre-Anesthetic Checklist: ,, timeout performed, Correct Patient, Correct Site, Correct Laterality, Correct Procedure, Correct Position, site marked, Risks and benefits discussed,  Surgical consent,  Pre-op evaluation,  At surgeon's request and post-op pain management  Laterality: Right  Prep: chloraprep       Needles:  Injection technique: Single-shot  Needle Type: Echogenic Stimulator Needle     Needle Length: 5cm 5 cm Needle Gauge: 22 and 22 G    Additional Needles:  Procedures: ultrasound guided (picture in chart) and nerve stimulator Femoral nerve block  Nerve Stimulator or Paresthesia:  Response: quadraceps contraction, 0.45 mA,   Additional Responses:   Narrative:  Start time: 10/02/2012 6:59 AM End time: 10/02/2012 7:09 AM Injection made incrementally with aspirations every 5 mL.  Performed by: Personally  Anesthesiologist: Halford Decamp, MD  Additional Notes: Functioning IV was confirmed and monitors were applied.  A 50mm 22ga Arrow echogenic stimulator needle was used. Sterile prep and drape,hand hygiene and sterile gloves were used. Ultrasound guidance: relevant anatomy identified, needle position confirmed, local anesthetic spread visualized around nerve(s)., vascular puncture avoided.  Image printed for medical record. Negative aspiration and negative test dose prior to incremental administration of local anesthetic. The patient tolerated the procedure well.    Femoral nerve block

## 2012-10-02 NOTE — Transfer of Care (Signed)
Immediate Anesthesia Transfer of Care Note  Patient: Laura Solis  Procedure(s) Performed: Procedure(s) with comments: TOTAL KNEE ARTHROPLASTY (Right) - Right Total Knee Arthroplasty  Patient Location: PACU  Anesthesia Type:General  Level of Consciousness: awake  Airway & Oxygen Therapy: Patient Spontanous Breathing and Patient connected to nasal cannula oxygen  Post-op Assessment: Report given to PACU RN and Post -op Vital signs reviewed and stable  Post vital signs: Reviewed and stable  Complications: No apparent anesthesia complications

## 2012-10-02 NOTE — Anesthesia Postprocedure Evaluation (Signed)
Anesthesia Post Note  Patient: Laura Solis  Procedure(s) Performed: Procedure(s) (LRB): TOTAL KNEE ARTHROPLASTY (Right)  Anesthesia type: general  Patient location: PACU  Post pain: Pain level controlled  Post assessment: Patient's Cardiovascular Status Stable  Last Vitals:  Filed Vitals:   10/02/12 1124  BP:   Pulse:   Temp: 36.7 C  Resp:     Post vital signs: Reviewed and stable  Level of consciousness: sedated  Complications: No apparent anesthesia complications

## 2012-10-02 NOTE — Op Note (Signed)
PATIENT ID:      Laura Solis  MRN:     [id number] DOB/AGE:    July 26, 1951 / 62 y.o.       OPERATIVE REPORT    DATE OF PROCEDURE:  10/02/2012       PREOPERATIVE DIAGNOSIS:   Right Knee Osteoarthrosis                                                       There is no weight on file to calculate BMI.     POSTOPERATIVE DIAGNOSIS:   Right Knee Osteoarthrosis                                                                     There is no weight on file to calculate BMI.     PROCEDURE:  Procedure(s): TOTAL KNEE ARTHROPLASTY right     SURGEON:  [name], MD    ASSISTANT:   [name], PA-C   (Present and scrubbed throughout the case, critical for assistance with exposure, retraction, instrumentation, and closure.)          ANESTHESIA: regional and general     DRAINS: (right knee) Hemovact drain(s) in the open with  Suction Open :      TOURNIQUET TIME:  Total Tourniquet Time Documented: Thigh (Right) - 74 minutes Total: Thigh (Right) - 74 minutes     COMPLICATIONS:  None   CONDITION:  stable  PROCEDURE IN DETAIL: [id number]  [name], [name] 10/02/2012, 9:37 AM

## 2012-10-02 NOTE — Progress Notes (Signed)
Orthopedic Tech Progress Note Patient Details:  Laura Solis Dec 11, 1950 [id number] CPM applied with appropriate settings. OHF applied to bed.  CPM Right Knee CPM Right Knee: On Right Knee Flexion (Degrees): 60 Right Knee Extension (Degrees): 0   Asia R Thompson 10/02/2012, 11:12 AM

## 2012-10-02 NOTE — Anesthesia Preprocedure Evaluation (Addendum)
Anesthesia Evaluation  Patient identified by MRN, date of birth, ID band Patient awake    Reviewed: Allergy & Precautions, H&P , NPO status , Patient's Chart, lab work & pertinent test results  History of Anesthesia Complications (+) PONV  Airway Mallampati: II TM Distance: >3 FB Neck ROM: Full    Dental  (+) Teeth Intact and Dental Advisory Given   Pulmonary neg pulmonary ROS,    Pulmonary exam normal       Cardiovascular negative cardio ROS      Neuro/Psych  Headaches, PSYCHIATRIC DISORDERS Anxiety    GI/Hepatic negative GI ROS, Neg liver ROS,   Endo/Other  negative endocrine ROS  Renal/GU negative Renal ROS     Musculoskeletal  (+) Fibromyalgia -  Abdominal   Peds  Hematology negative hematology ROS (+)   Anesthesia Other Findings   Reproductive/Obstetrics                          Anesthesia Physical Anesthesia Plan  ASA: II  Anesthesia Plan: General   Post-op Pain Management:    Induction: Intravenous  Airway Management Planned: LMA  Additional Equipment:   Intra-op Plan:   Post-operative Plan: Extubation in OR  Informed Consent: I have reviewed the patients History and Physical, chart, labs and discussed the procedure including the risks, benefits and alternatives for the proposed anesthesia with the patient or authorized representative who has indicated his/her understanding and acceptance.   Dental advisory given  Plan Discussed with: CRNA, Anesthesiologist and Surgeon  Anesthesia Plan Comments:        Anesthesia Quick Evaluation

## 2012-10-02 NOTE — H&P (Signed)
The recent History & Physical has been reviewed. I have personally examined the patient today. There is no interval change to the documented History & Physical. The patient would like to proceed with the procedure.  [name] W 10/02/2012,  7:28 AM

## 2012-10-03 ENCOUNTER — Encounter (HOSPITAL_COMMUNITY): Payer: Self-pay | Admitting: Orthopaedic Surgery

## 2012-10-03 DIAGNOSIS — M797 Fibromyalgia: Secondary | ICD-10-CM | POA: Diagnosis present

## 2012-10-03 DIAGNOSIS — E669 Obesity, unspecified: Secondary | ICD-10-CM | POA: Diagnosis present

## 2012-10-03 DIAGNOSIS — M1711 Unilateral primary osteoarthritis, right knee: Secondary | ICD-10-CM | POA: Diagnosis present

## 2012-10-03 LAB — BASIC METABOLIC PANEL
Chloride: 100 mEq/L (ref 96–112)
Creatinine, Ser: 0.66 mg/dL (ref 0.50–1.10)
GFR calc Af Amer: >90 mL/min (ref >90)
Potassium: 3.9 mEq/L (ref 3.5–5.1)
Sodium: 139 mEq/L (ref 135–145)

## 2012-10-03 LAB — CBC
Platelets: 264 10*3/uL (ref 150–400)
RBC: 4.06 MIL/uL (ref 3.87–5.11)
RDW: 13 % (ref 11.5–15.5)
WBC: 18.5 10*3/uL — ABNORMAL HIGH (ref 4.0–10.5)

## 2012-10-03 NOTE — Progress Notes (Signed)
Patient ID: Laura Solis, female   DOB: [birth year], 62 y.o.   MRN: [id number] PATIENT ID: Laura Solis        MRN:  [id number]          DOB/AGE: [birth year] / 63 y.o.    [name], MD   [name], PA-C [address] [place], [place]  [address]                             [phone]   PROGRESS NOTE  Subjective:  negative for Chest Pain  negative for Shortness of Breath  negative for Nausea/Vomiting   negative for Calf Pain    Tolerating Diet: yes         Patient reports pain as mild.     Good night with minimal pain--femoral nerve block still in effect  Objective: Vital signs in last 24 hours:   Patient Vitals for the past 24 hrs:  BP Temp Temp src Pulse Resp SpO2  10/03/12 0727 - - - - 18 -  10/03/12 0507 154/78 mmHg 97.9 F (36.6 C) Oral 72 18 96 %  10/03/12 0147 125/57 mmHg 98 F (36.7 C) - 88 18 100 %  10/03/12 0025 - - - - 18 93 %  10/02/12 2125 121/48 mmHg 98.1 F (36.7 C) - 86 18 98 %  10/02/12 2000 - - - - 18 96 %  10/02/12 1600 - - - - 18 -  10/02/12 1200 110/57 mmHg 98 F (36.7 C) Oral 97 16 97 %  10/02/12 1124 - 98.1 F (36.7 C) - - - -  10/02/12 1115 - - - 91 9 98 %  10/02/12 1109 134/65 mmHg - - 89 7 96 %  10/02/12 1108 - - - 87 9 98 %  10/02/12 1107 - - - 91 14 98 %  10/02/12 1100 - - - 91 9 94 %  10/02/12 1054 142/84 mmHg - - 100 22 98 %  10/02/12 1045 - - - 85 13 98 %  10/02/12 1039 128/73 mmHg - - 85 10 97 %  10/02/12 1038 - - - 85 9 97 %  10/02/12 1030 - - - 86 11 99 %  10/02/12 1024 159/90 mmHg - - 99 19 99 %  10/02/12 1015 - - - 83 9 99 %  10/02/12 1010 - - - 85 11 99 %  10/02/12 1009 163/80 mmHg - - 87 9 98 %  10/02/12 1008 - 98.2 F (36.8 C) - - - -      Intake/Output from previous day:   03/04 0701 - 03/05 0700 In: 2150 [I.V.:2150] Out: 2600 [Urine:1850; Drains:650]   Intake/Output this shift:       Intake/Output     03/04 0701 - 03/05 0700 03/05 0701 - 03/06 0700   I.V. 2150    Total Intake 2150     Urine 1850      Drains 650    Blood 100    Total Output 2600     Net -450          Stool Occurrence 1 x       LABORATORY DATA:  Recent Labs  10/03/12 0455  WBC 18.5*  HGB 12.8  HCT 38.2  PLT 264    Recent Labs  10/03/12 0455  NA 139  K 3.9  CL 100  CO2 26  BUN 13  CREATININE 0.66  GLUCOSE 137*  CALCIUM  9.7   Lab Results  Component Value Date   INR 0.98 09/25/2012    Recent Radiographic Studies :   Chest 2 View  09/25/2012  *RADIOLOGY REPORT*  Clinical Data: Preop for right knee arthroplasty  CHEST - 2 VIEW  Comparison: None.  Findings: Cardiomediastinal silhouette is unremarkable.  No acute infiltrate or pleural effusion.  Metallic anchors are seen bilateral proximal humerus.  Mild degenerative changes thoracic spine.  No pulmonary edema.  IMPRESSION: No active disease.  Degenerative changes thoracic spine.   Original Report Authenticated By: Natasha Mead, M.D.      Examination:  General appearance: alert, cooperative and no distress  Wound Exam: clean, dry, intact   Drainage:  None: wound tissue dry  Motor Exam: EHL, FHL, Anterior Tibial and Posterior Tibial Intact  Sensory Exam: Superficial Peroneal, Deep Peroneal and Tibial normal  Vascular Exam: Normal  Assessment:    1 Day Post-Op  Procedure(s) (LRB): TOTAL KNEE ARTHROPLASTY (Right)  ADDITIONAL DIAGNOSIS:  Principal Problem:   Osteoarthritis of right knee  Hypokalemia-pre op 3.3-now normal   Plan: Physical Therapy as ordered Partial Weight Bearing @ 50% (PWB)  DVT Prophylaxis:  Xarelto  DISCHARGE PLAN: Skilled Nursing Facility/Rehab- family has made arrangem,ents for [place] in [place]  DISCHARGE NEEDS: HHPT and Walker    PT today, D/C hemovac in am     [facility], [name] 10/03/2012 7:51 AM

## 2012-10-03 NOTE — Op Note (Signed)
NAMEMarland Kitchen  SHARY, LAMOS NO.:  [id number]  MEDICAL RECORD NO.:  [id number]  LOCATION:  5N31C                        FACILITY:  [facility]  PHYSICIAN:  Claude Manges. [name], M.D.DATE OF BIRTH:  1951/04/08  DATE OF PROCEDURE:  10/02/2012 DATE OF DISCHARGE:                              OPERATIVE REPORT   PREOPERATIVE DIAGNOSIS:  End-stage osteoarthritis, right knee.  POSTOPERATIVE DIAGNOSIS:  End-stage osteoarthritis, right knee.  PROCEDURE:  Right total knee replacement.  SURGEON:  Claude Manges. Cleophas Dunker, MD  ASSISTANT:  Oris Drone. Petrarca, PA-C, who was present throughout the operative procedure to ensure its timely completion.  ANESTHESIA:  Femoral nerve block and general.  COMPLICATIONS:  None.  COMPONENTS:  DePuy LCS standard femoral component with a #3 rotating keeled tibial tray, a 10-mm polyethylene bridging bearing, a 3 PEG metal back rotating patella.  All components were secured with polymethyl methacrylate.  DESCRIPTION OF PROCEDURE:  Ms. Venning was met in the holding area, identified the right knee as appropriate operative site and then marked leg.  The anesthesia performed of right femoral nerve block.  Ms. Wandler was then transported to room #7 and placed under general anesthesia without difficulty.  The nursing staff inserted a Foley catheter.  The urine was clear.  Tourniquet was applied to the right thigh.  The leg was then prepped with Betadine scrub and then DuraPrep from the tourniquet to the tips of the toes, sterile draping was performed.  With the extremity still elevated, the Esmarch was exsanguinated with a proximal tourniquet at 350 mmHg.  A midline longitudinal incision was made centered about the patella. Via sharp dissection, incision was carried down to subcutaneous tissue. First layer of capsule was incised in midline and medial parapatellar incision was made with the Bovie.  A medial parapatellar incision through the deep capsule  was made with the Bovie.  The joint was entered.  There was a minimal clear-yellow joint effusion.  The patella was everted 180 degrees laterally.  The knee flexed to 90 degrees.  There was a moderate amount of beefy-red synovitis, large osteophytes along the medial and lateral femoral condyle, nearly complete absence of articular cartilage particularly in the lateral compartment.  There was an increased valgus with weightbearing, but I could correct the knee to neutral.  Osteophytes were removed.  I then measured a standard femoral component.  Retractors were then placed around the tibia.  First cut was made transversely in the proximal tibia with a 7-degree angle of declination using the external tibial guide.  At every bony cut, we checked our alignment with the alignment jig and thought we had anatomic position. Subsequent cuts were then made on the femur using the standard femoral jig.  Flexion and extension gaps were symmetrical at 10 mm.  MCL and LCL remained intact throughout the operative procedure.  Laminar spreaders were inserted in the medial and lateral compartment to remove medial and lateral menisci, ACL, and PCL.  Osteophytes were removed from the posterior femoral condyle using a 3/4-inch curved osteotome.  There was a Baker cyst identified in the posteromedial aspect of the knee.  I debrided as much as I could visualize.  There were no  loose bodies.  Distal femoral valgus cut was made with 4 degrees of valgus.  Finishing guide was then applied to obtain the tapering cut into center holes.  Retractors were then placed around the tibia, was advanced anteriorly. We measured a #3 rotating tibial tray.  The cutting jig was then pinned in place.  A center hole was made followed by the keeled cut from the tibial jig in place.  A 10-mm bridging bearing was inserted followed by the standard femoral component.  Through full range of motion, we had excellent alignment of the  components.  We had full extension and flexion well over 120 degrees.  There was no opening with the varus or valgus stress.  Patella was prepared by removing 10 mm of bone leaving 13 mm of patella thickness.  Three trial holes were then made.  The trial patella was inserted and through a full range of motion remained stable.  Trial components were removed.  The joint was copiously irrigated with saline solution.  The final components were impacted with polymethyl methacrylate.  Initially, inserted the #3 rotating tibial tray, followed by the 10-mm bridging bearing, and then this standard femoral component.  With the knee impacted, extraneous methacrylate was removed from its periphery. The patella was applied with methacrylate and a patellar clamp.  While waiting for the methacrylate to mature, we again irrigated the joint, injected the deep capsule with 0.25% Marcaine with epinephrine.  About 16 minutes methacrylate had matured.  Tourniquet was released at 74 minutes.  The joint was again irrigated with saline solution, any gross bleeders were Bovie coagulated.  We then sprayed the joint with thrombin and inserted a medium-size Hemovac.  The deep capsule was closed with interrupted #1 Ethibond superficial capsule with running 0 Vicryl, subcu with 3-0 Monocryl.  Skin closed with skin clips.  Sterile bulky dressing was applied followed by the patient's support stocking.  The patient tolerated the procedure well without complications.     Claude Manges. Cleophas Dunker, M.D.     PWW/MEDQ  D:  10/02/2012  T:  10/03/2012  Job:  [id number]

## 2012-10-03 NOTE — Progress Notes (Signed)
Physical Therapy Treatment Patient Details Name: Laura Solis MRN: [id number] DOB: [birth year] Today's Date: 10/03/2012 Time: 7829-5621 PT Time Calculation (min): 38 min  PT Assessment / Plan / Recommendation Comments on Treatment Session  Pt continues to have knee extensor weakness.  Unable to SLR without KI to maintain knee extension.  Gait much improved with KI will work with pt on maintaining PWB as she appears to be placing full wt on RLE>.      Follow Up Recommendations  SNF;Supervision/Assistance - 24 hour     Does the patient have the potential to tolerate intense rehabilitation     Barriers to Discharge        Equipment Recommendations  Rolling walker with 5" wheels    Recommendations for Other Services    Frequency 7X/week   Plan Discharge plan remains appropriate;Frequency remains appropriate    Precautions / Restrictions Precautions Precautions: Knee Precaution Comments: Pt able to recall education for positioning of knee Restrictions Weight Bearing Restrictions: Yes RLE Weight Bearing: Partial weight bearing RLE Partial Weight Bearing Percentage or Pounds: 50%   Pertinent Vitals/Pain No c/o pain at rest.  3/10 pain with activity.  Pt continues to c/o numbness in R knee.     Mobility  Bed Mobility Bed Mobility: Supine to Sit;Sit to Supine Supine to Sit: 6: Modified independent (Device/Increase time);HOB flat Sitting - Scoot to Edge of Bed: 6: Modified independent (Device/Increase time) Sit to Supine: 6: Modified independent (Device/Increase time);HOB flat Details for Bed Mobility Assistance: Pt able to manage RLE with KI on.   Transfers Transfers: Sit to Stand;Stand to [employer] Transfers Sit to Stand: 4: Min guard Stand to Sit: 4: Min guard Details for Transfer Assistance: Vcs for hand placement.   Ambulation/Gait Ambulation/Gait Assistance: 4: Min guard Ambulation Distance (Feet): 70 Feet (x2) Assistive device: Rolling walker Ambulation/Gait  Assistance Details: VCs to maintain safe distance from walker.  Gait Pattern: Step-to pattern;Decreased stride length Stairs: Yes Stairs Assistance: 4: Min assist Stairs Assistance Details (indicate cue type and reason): Assist to steady pt secondary to RLE weakness.   Stair Management Technique: One rail Right;Step to pattern;With cane Number of Stairs: 5 Wheelchair Mobility Wheelchair Mobility: No    Exercises Total Joint Exercises Ankle Circles/Pumps: Both;5 reps Quad Sets: Right;Seated;AROM;10 reps Short Arc Quad: Right;10 reps;Supine;AAROM Heel Slides: Right;10 reps;Supine Straight Leg Raises: Right;10 reps;AROM;AAROM (with KI on ) Goniometric ROM: 0-90 degrees   PT Diagnosis:    PT Problem List:   PT Treatment Interventions:     PT Goals Acute Rehab PT Goals PT Goal Formulation: With patient Time For Goal Achievement: 10/16/12 Potential to Achieve Goals: Good Pt will go Supine/Side to Sit: Independently PT Goal: Supine/Side to Sit - Progress: Progressing toward goal Pt will go Sit to Supine/Side: Independently PT Goal: Sit to Supine/Side - Progress: Progressing toward goal Pt will Transfer Bed to Chair/Chair to Bed: with modified independence PT Transfer Goal: Bed to Chair/Chair to Bed - Progress: Progressing toward goal Pt will Ambulate: >150 feet;with modified independence;with least restrictive assistive device PT Goal: Ambulate - Progress: Progressing toward goal Pt will Perform Home Exercise Program: Independently PT Goal: Perform Home Exercise Program - Progress: Progressing toward goal  Visit Information  Last PT Received On: 10/03/12 Assistance Needed: +1    Subjective Data  Subjective: The brace helps a lot.  Patient Stated Goal: Walk without pain.    Cognition  Cognition Overall Cognitive Status: Appears within functional limits for tasks assessed/performed Arousal/Alertness: Awake/alert Orientation Level: Oriented  X4 / Intact Behavior During  Session: [facility] for tasks performed    Balance  Balance Balance Assessed: No  End of Session PT - End of Session Equipment Utilized During Treatment: Gait belt Activity Tolerance: Patient tolerated treatment well Patient left: in chair;with call bell/phone within reach Nurse Communication: Mobility status   GP     GREEN,PETER 10/03/2012, 3:41 PM Peter L. Green DPT [phone]

## 2012-10-03 NOTE — Progress Notes (Signed)
Physical Therapy Treatment Patient Details Name: Laura Solis MRN: [id number] DOB: 1951/03/02 Today's Date: 10/03/2012 Time: 1191-4782 PT Time Calculation (min): 26 min  PT Assessment / Plan / Recommendation Comments on Treatment Session  Pt continues to have difficulty maintaining knee extension with PWB on RLE.  Pt unable to actively extend R knee with bed mobilty.  PT will try pt with a KI this afternoon to progress independence with mobilty.      Follow Up Recommendations  SNF;Supervision/Assistance - 24 hour     Does the patient have the potential to tolerate intense rehabilitation     Barriers to Discharge        Equipment Recommendations  Rolling walker with 5" wheels    Recommendations for Other Services    Frequency 7X/week   Plan Discharge plan remains appropriate;Frequency remains appropriate    Precautions / Restrictions Precautions Precautions: Knee Precaution Comments: Pt able to recall education for positioning of knee Restrictions Weight Bearing Restrictions: Yes RLE Weight Bearing: Partial weight bearing RLE Partial Weight Bearing Percentage or Pounds: 50%   Pertinent Vitals/Pain Pt reporting no pain in R knee.      Mobility  Bed Mobility Bed Mobility: Supine to Sit;Sitting - Scoot to Edge of Bed Supine to Sit: 5: Supervision Sitting - Scoot to Edge of Bed: 5: Supervision Details for Bed Mobility Assistance: Pt able to manage RLE with use of UEs.   Transfers Transfers: Sit to Stand;Stand to [employer] Transfers Sit to Stand: 4: Min guard;From chair/3-in-1;With upper extremity assist Stand to Sit: 4: Min guard;To chair/3-in-1;With upper extremity assist Stand Pivot Transfers: 4: Min assist Details for Transfer Assistance: VCs for hand placement and safe technique.  Assist to steady pt secondary to R knee buckling.   Ambulation/Gait Ambulation/Gait Assistance: 3: Mod assist;4: Min guard Ambulation Distance (Feet): 40 Feet Assistive device: Rolling  walker Ambulation/Gait Assistance Details: Mod assist initially secondary to significant buckling in R knee.  Pt reports that she has more control when taking a smaller step.  Ambulated next 35 feet with pt taking smaller steps and gait was much improved.   Knee buckling still present but pt demonstrating greater control.   Gait Pattern: Step-to pattern;Decreased stride length;Decreased stance time - right;Decreased weight shift to right Stairs: No Wheelchair Mobility Wheelchair Mobility: No    Exercises Total Joint Exercises Ankle Circles/Pumps: Both;5 reps Quad Sets: Right;5 reps;Seated;AROM Heel Slides: Right;10 reps;Supine   PT Diagnosis:    PT Problem List:   PT Treatment Interventions:     PT Goals Acute Rehab PT Goals PT Goal Formulation: With patient Time For Goal Achievement: 10/16/12 Potential to Achieve Goals: Good Pt will go Supine/Side to Sit: Independently PT Goal: Supine/Side to Sit - Progress: Progressing toward goal Pt will go Sit to Supine/Side: Independently PT Goal: Sit to Supine/Side - Progress: Progressing toward goal Pt will Transfer Bed to Chair/Chair to Bed: with modified independence PT Transfer Goal: Bed to Chair/Chair to Bed - Progress: Progressing toward goal Pt will Ambulate: >150 feet;with modified independence;with least restrictive assistive device PT Goal: Ambulate - Progress: Progressing toward goal Pt will Perform Home Exercise Program: Independently PT Goal: Perform Home Exercise Program - Progress: Progressing toward goal  Visit Information  Last PT Received On: 10/03/12 Assistance Needed: +1    Subjective Data  Subjective: I have more control of that leg today.   Patient Stated Goal: Walk without pain.    Cognition  Cognition Overall Cognitive Status: Appears within functional limits for tasks assessed/performed  Arousal/Alertness: Awake/alert Orientation Level: Oriented X4 / Intact    Balance  Balance Balance Assessed: No  End of  Session PT - End of Session Equipment Utilized During Treatment: Gait belt Activity Tolerance: Patient tolerated treatment well Patient left: in chair;with call bell/phone within reach Nurse Communication: Mobility status CPM Right Knee CPM Right Knee: Off   GP     GREEN,PETER 10/03/2012, 10:33 AM Theron Arista L. Green DPT [phone]

## 2012-10-03 NOTE — Progress Notes (Signed)
CARE MANAGEMENT NOTE 10/03/2012  Patient:  Laura Solis, Laura Solis   Account Number:  [id number]  Date Initiated:  10/03/2012  Documentation initiated by:  Vance Peper  Subjective/Objective Assessment:   62 yr old female s/p right total knee arthroplasty.     Action/Plan:   Patient preoperatively registered with [facility] SNF in [place] for shortterm rehab.Social worker is aware.   Anticipated DC Date:  10/05/2012   Anticipated DC Plan:  SKILLED NURSING FACILITY  In-house referral  Clinical Social Worker      DC Planning Services  CM consult      Choice offered to / List presented to:             Status of service:  Completed, signed off Medicare Important Message given?   (If response is "NO", the following Medicare IM given date fields will be blank) Date Medicare IM given:   Date Additional Medicare IM given:    Discharge Disposition:  SKILLED NURSING FACILITY  Per UR Regulation:    If discussed at Long Length of Stay Meetings, dates discussed:    Comments:

## 2012-10-03 NOTE — Evaluation (Signed)
Occupational Therapy Evaluation Patient Details Name: DELILIAH SPRANGER MRN: [id number] DOB: 04-21-1951 Today's Date: 10/03/2012 Time: 1478-2956 OT Time Calculation (min): 27 min  OT Assessment / Plan / Recommendation Clinical Impression  Pleasant 62 yr old female admitted for right TKA.  Pt currently at a min assist to min guard level for selfcare tasks. 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  Feel she will need short term SNF rehab to reach modified independent level.    OT Assessment  Patient needs continued OT Services    Follow Up Recommendations  SNF    Barriers to Discharge Decreased caregiver support    Equipment Recommendations  3 in 1 bedside comode       Frequency  Min 2X/week    Precautions / Restrictions Precautions Precautions: Knee Precaution Comments: Pt able to recall education for positioning of knee Restrictions Weight Bearing Restrictions: Yes RLE Weight Bearing: Partial weight bearing RLE Partial Weight Bearing Percentage or Pounds: 50%   Pertinent Vitals/Pain Pain 1/10 in the right knee during session    ADL  Eating/Feeding: Simulated;Independent Where Assessed - Eating/Feeding: Chair Grooming: Performed;Min guard Where Assessed - Grooming: Supported standing Upper Body Bathing: Simulated;Set up Where Assessed - Upper Body Bathing: Unsupported sitting Lower Body Bathing: Simulated;Min guard Where Assessed - Lower Body Bathing: Supported sit to stand Upper Body Dressing: Supervision/safety Where Assessed - Upper Body Dressing: Unsupported sitting Lower Body Dressing: Simulated;Minimal assistance Where Assessed - Lower Body Dressing: Supported sit to stand Toilet Transfer: Performed;Min guard Statistician Method:  (ambulate with RW) Acupuncturist: Comfort height toilet;Grab bars Toileting - Architect and Hygiene: Performed;Min guard Where Assessed  - Engineer, mining and Hygiene: Sit to stand from 3-in-1 or toilet Tub/Shower Transfer Method: Not assessed Equipment Used: Knee Immobilizer Transfers/Ambulation Related to ADLs: Pt able to ambulate with min guard assist using the RW. ADL Comments: Pt with good flexibility in the knee already.  Able to donn her sock on the right foot without much difficulty.    OT Diagnosis: Generalized weakness;Acute pain  OT Problem List: Decreased strength;Decreased activity tolerance;Impaired balance (sitting and/or standing);Decreased knowledge of use of DME or AE;Pain OT Treatment Interventions: Self-care/ADL training;DME and/or AE instruction;Therapeutic activities;Patient/family education;Balance training   OT Goals Acute Rehab OT Goals OT Goal Formulation: With patient Time For Goal Achievement: 10/10/12 Potential to Achieve Goals: Good ADL Goals Pt Will Perform Grooming: with modified independence;Standing at sink;Supported ADL Goal: Grooming - Progress: Goal set today Pt Will Perform Lower Body Bathing: with modified independence;Sit to stand from bed ADL Goal: Lower Body Bathing - Progress: Goal set today Pt Will Perform Lower Body Dressing: with modified independence;Sit to stand from bed ADL Goal: Lower Body Dressing - Progress: Goal set today Pt Will Transfer to Toilet: with modified independence;with DME;3-in-1 ADL Goal: Toilet Transfer - Progress: Goal set today Pt Will Perform Toileting - Clothing Manipulation: with modified independence ADL Goal: Toileting - Clothing Manipulation - Progress: Goal set today Pt Will Perform Toileting - Hygiene: with modified independence;Sit to stand from 3-in-1/toilet ADL Goal: Toileting - Hygiene - Progress: Goal set today  Visit Information  Last OT Received On: 10/03/12 Assistance Needed: +1    Subjective Data  Subjective: I haven't tried wearing the knee brace yet. Patient Stated Goal: Did not state during this session.    Prior Functioning     Home Living Lives With: Other (Comment) Available Help at Discharge: Skilled  Nursing Facility Type of Home: Mobile home Prior Function Level of Independence: Independent Able to Take Stairs?: Yes Driving: Yes Vocation: On disability Communication Communication: No difficulties         Vision/Perception Vision - History Baseline Vision: Wears glasses all the time Patient Visual Report: No change from baseline Vision - Assessment Eye Alignment: Within Functional Limits Vision Assessment: Vision not tested Perception Perception: Within Functional Limits Praxis Praxis: Intact   Cognition  Cognition Overall Cognitive Status: Appears within functional limits for tasks assessed/performed Arousal/Alertness: Awake/alert Orientation Level: Oriented X4 / Intact Behavior During Session: [facility] for tasks performed    Extremity/Trunk Assessment Right Upper Extremity Assessment RUE ROM/Strength/Tone: Deficits RUE ROM/Strength/Tone Deficits: Pt with limited shoudler external rotation accompanied with shoulder flexion secondary to history of bilateral rotator cuff repair. RUE Sensation: WFL - Light Touch RUE Coordination: WFL - gross/fine motor Left Upper Extremity Assessment LUE ROM/Strength/Tone: Deficits LUE ROM/Strength/Tone Deficits: Pt with limited shoudler external rotation accompanied with shoulder flexion secondary to history of bilateral rotator cuff repair. LUE Sensation: WFL - Light Touch LUE Coordination: WFL - gross/fine motor Trunk Assessment Trunk Assessment: Normal     Mobility Bed Mobility Bed Mobility: Supine to Sit Supine to Sit: HOB flat;6: Modified independent (Device/Increase time) Sitting - Scoot to Edge of Bed: 6: Modified independent (Device/Increase time) Sit to Supine: HOB flat;6: Modified independent (Device/Increase time) Details for Bed Mobility Assistance: Pt able to manage RLE with KI on.   Transfers Transfers: Sit to  Stand Sit to Stand: 4: Min guard;With upper extremity assist;From toilet Stand to Sit: 4: Min guard;Without upper extremity assist;To toilet Details for Transfer Assistance: Vcs for hand placement.       Exercise Total Joint Exercises Ankle Circles/Pumps: Both;5 reps Quad Sets: Right;Seated;AROM;10 reps Short Arc Quad: Right;10 reps;Supine;AAROM Heel Slides: Right;10 reps;Supine Straight Leg Raises: Right;10 reps;AROM;AAROM (with KI on ) Goniometric ROM: 0-90 degrees   Balance Balance Balance Assessed: Yes Dynamic Standing Balance Dynamic Standing - Balance Support: Right upper extremity supported;Left upper extremity supported Dynamic Standing - Level of Assistance: 5: Stand by assistance   End of Session OT - End of Session Equipment Utilized During Treatment: Right knee immobilizer Activity Tolerance: Patient tolerated treatment well Patient left: in chair;with call bell/phone within reach Nurse Communication: Mobility status     MCGUIRE,JAMES OTR/L Pager number [phone] 10/03/2012, 4:50 PM

## 2012-10-03 NOTE — Clinical Social Work Psychosocial (Signed)
Clinical Social Work Department  BRIEF PSYCHOSOCIAL ASSESSMENT  Patient: Laura Solis  Account Number: [id number] Admit date: 10/02/12 Clinical Social Worker Sabino Niemann, MSW Date/Time: 10/03/2012 1:00 PM Referred by: Physician Date Referred: 10/03/2012 Referred for   SNF Placement   Other Referral:  Interview type: Patient  Other interview type: PSYCHOSOCIAL DATA  Living Status: Alone Admitted from facility:  Level of care:  Primary support name: Smith,Melissa Primary support relationship to patient: daughter Degree of support available:  Fair  CURRENT CONCERNS  Current Concerns   Post-Acute Placement   Other Concerns:  SOCIAL WORK ASSESSMENT / PLAN  CSW met with pt re: PT recommendation for SNF.   Pt lives alone in [place]  CSW explained placement process and answered questions.   Pt reported that she pre-registered with [facility] in [place]   CSW completed FL2 and sent clinicals to [facility]    Assessment/plan status: Information/Referral to [facility]  Other assessment/ plan:  Information/referral to community resources:  SNF   PTAR   PATIENT'S/FAMILY'S RESPONSE TO PLAN OF CARE:  Pt  reports she is agreeable to [facility] SN[facility] in order to increase strength and independence with mobility prior to return home  Pt verbalized understanding of placement process and appreciation for CSW assist.   Sabino Niemann, MSW [phone]

## 2012-10-03 NOTE — Progress Notes (Signed)
Orthopedic Tech Progress Note Patient Details:  Laura Solis 09/29/1950 [id number]  Ortho Devices Type of Ortho Device: Knee Immobilizer Ortho Device/Splint Interventions: Ordered   Shawnie Pons 10/03/2012, 10:25 AM

## 2012-10-03 NOTE — Clinical Social Work Placement (Signed)
Clinical Social Work Department  CLINICAL SOCIAL WORK PLACEMENT NOTE  10/03/2012  Patient: Laura Solis Account Number: [id number]  Admit date: 10/01/2012  Clinical Social Worker: Sabino Niemann MSW Date/time: 10/03/2012 11:30 AM  Clinical Social Work is seeking post-discharge placement for this patient at the following level of care: SKILLED NURSING (*CSW will update this form in Epic as items are completed)  10/03/2012 Patient/family provided with [facility] Department of Clinical Social Work's list of facilities offering this level of care within the geographic area requested by the patient (or if unable, by the patient's family).  10/03/2012 Patient/family informed of their freedom to choose among providers that offer the needed level of care, that participate in Medicare, Medicaid or managed care program needed by the patient, have an available bed and are willing to accept the patient.  10/03/2012 Patient/family informed of [facility]' ownership interest in [facility], as well as of the fact that they are under no obligation to receive care at this facility.  PASARR submitted to EDS on 10/03/2012  PASARR number received from EDS on 10/03/2012  FL2 transmitted to all facilities in geographic area requested by pt/family on 10/03/2012  FL2 transmitted to all facilities within larger geographic area on  Patient informed that his/her managed care company has contracts with or will negotiate with certain facilities, including the following:  Patient/family informed of bed offers received:  Patient chooses bed at [facility] in [place]  Physician recommends and patient chooses bed at  Patient to be transferred to on  Patient to be transferred to facility by  The following physician request were entered in Epic:  Additional Comments:  Sabino Niemann, MSW,  [phone]

## 2012-10-04 LAB — CBC
HCT: 31.6 % — ABNORMAL LOW (ref 36.0–46.0)
Hemoglobin: 10.5 g/dL — ABNORMAL LOW (ref 12.0–15.0)
RBC: 3.36 MIL/uL — ABNORMAL LOW (ref 3.87–5.11)
RDW: 13.4 % (ref 11.5–15.5)
WBC: 10.7 10*3/uL — ABNORMAL HIGH (ref 4.0–10.5)

## 2012-10-04 LAB — BASIC METABOLIC PANEL
BUN: 11 mg/dL (ref 6–23)
CO2: 26 mEq/L (ref 19–32)
Chloride: 103 mEq/L (ref 96–112)
GFR calc Af Amer: >90 mL/min (ref >90)
Potassium: 4.4 mEq/L (ref 3.5–5.1)

## 2012-10-04 MED ORDER — METHOCARBAMOL 500 MG PO TABS: 500.0000 mg | ORAL_TABLET | Freq: Four times a day (QID) | ORAL | Status: DC | PRN

## 2012-10-04 MED ORDER — OXYCODONE HCL 5 MG PO TABS: 5.0000 mg | ORAL_TABLET | ORAL | Status: DC | PRN

## 2012-10-04 MED ORDER — RIVAROXABAN 10 MG PO TABS: 10.0000 mg | ORAL_TABLET | ORAL | Status: DC

## 2012-10-04 MED ORDER — MUPIROCIN 2 % EX OINT: 1.0000 "application " | TOPICAL_OINTMENT | Freq: Two times a day (BID) | CUTANEOUS | Status: DC

## 2012-10-04 NOTE — Progress Notes (Signed)
Physical Therapy Treatment Patient Details Name: Laura Solis MRN: [id number] DOB: 02/22/1951 Today's Date: 10/04/2012 Time: 7829-5621 PT Time Calculation (min): 28 min  PT Assessment / Plan / Recommendation Comments on Treatment Session  Pt s/p R TKA progressing well today able to complete SLR and walk without KI without any buckling. Pt encouraged to continue gait with staff as well as HEP. Heel roll end of session. Pt successfully maintiaining PWB throughout.     Follow Up Recommendations        Does the patient have the potential to tolerate intense rehabilitation     Barriers to Discharge        Equipment Recommendations       Recommendations for Other Services    Frequency     Plan Discharge plan remains appropriate;Frequency remains appropriate    Precautions / Restrictions Precautions Precautions: Knee Restrictions Weight Bearing Restrictions: Yes RLE Weight Bearing: Partial weight bearing RLE Partial Weight Bearing Percentage or Pounds: 50   Pertinent Vitals/Pain 3/10 pain Right knee with activity    Mobility  Bed Mobility Bed Mobility: Supine to Sit;Sitting - Scoot to Edge of Bed Supine to Sit: 6: Modified independent (Device/Increase time);HOB flat Sitting - Scoot to Edge of Bed: 6: Modified independent (Device/Increase time) Transfers Sit to Stand: 5: Supervision;From bed Stand to Sit: 5: Supervision;To chair/3-in-1 Details for Transfer Assistance: cueing for hand placement only Ambulation/Gait Ambulation/Gait Assistance: 5: Supervision Ambulation Distance (Feet): 210 Feet Assistive device: Rolling walker Ambulation/Gait Assistance Details: cueing for sequence to maintain PWB and to look up  Gait Pattern: Step-through pattern;Decreased stance time - right;Decreased stride length Gait velocity: decreased Stairs: Yes Stairs Assistance: 4: Min assist Stairs Assistance Details (indicate cue type and reason): cueing for sequence. Attempted with RW backward  with pt performing well and keeping weight off RLE. With attempt at sideways with one rail pt unable to successfully maintain PWB and discussed all 3 options for stair ambulation  Stair Management Technique: One rail Right;Sideways;Backwards;With walker Number of Stairs: 2    Exercises Total Joint Exercises Heel Slides: AAROM;Right;20 reps;Seated Straight Leg Raises: AROM;Right;20 reps;Seated Long Arc Quad: AROM;Right;20 reps;Seated   PT Diagnosis:    PT Problem List:   PT Treatment Interventions:     PT Goals Acute Rehab PT Goals PT Goal: Supine/Side to Sit - Progress: Progressing toward goal PT Goal: Ambulate - Progress: Progressing toward goal PT Goal: Perform Home Exercise Program - Progress: Progressing toward goal  Visit Information  Last PT Received On: 10/04/12 Assistance Needed: +1    Subjective Data  Subjective: I feel like it is much better today   Cognition  Cognition Overall Cognitive Status: Appears within functional limits for tasks assessed/performed Arousal/Alertness: Awake/alert Orientation Level: Appears intact for tasks assessed Behavior During Session: [facility] for tasks performed    Balance     End of Session PT - End of Session Equipment Utilized During Treatment: Gait belt Activity Tolerance: Patient tolerated treatment well Patient left: in chair;with call bell/phone within reach (with heel roll) CPM Right Knee CPM Right Knee: Off   GP     Toney Sang Beth 10/04/2012, 11:56 AM Delaney Meigs, PT [phone]

## 2012-10-04 NOTE — Discharge Summary (Signed)
[name], MD   [name], PA-C [address] [place], [place]  [address]                             [phone]  PATIENT ID: Laura Solis        MRN:  [id number]          DOB/AGE: Oct 08, 1950 / 62 y.o.    DISCHARGE SUMMARY  ADMISSION DATE:    10/02/2012 DISCHARGE DATE:   10/04/2012   ADMISSION DIAGNOSIS: Right Knee Osteoarthrosis    DISCHARGE DIAGNOSIS:  Right Knee Osteoarthrosis    ADDITIONAL DIAGNOSIS: Principal Problem:   Osteoarthritis of right knee Active Problems:   Fibromyalgia syndrome   Obesity (BMI 30.0-34.9)  Past Medical History  Diagnosis Date  . PONV (postoperative nausea and vomiting)   . Anxiety   . ADHD (attention deficit hyperactivity disorder)   . Headache   . Fibromyalgia   . Arthritis   . Pneumonia     PROCEDURE: Procedure(s): TOTAL KNEE ARTHROPLASTY Righton 10/02/2012  CONSULTS: none     HISTORY: Christmas is a very pleasant 62 year old white female who is seen today for evaluation of her right knee. History is that she has been having persistent right knee pain, which appears to be worsening. In February of 2011, she did have a lateral meniscectomy, a 3 compartment chondroplasty performed by Dr. Priscille Kluver. She had rather significant OA at the time as well as the tear, lateral meniscus. He did chondroplasty of all 3 joints. Over time, though, she has been having increasing pain and discomfort in the right knee to the point now where she cannot straighten the knee out fully and she is also having a significant arthritic aching-type pain as well as sharp stabbing pains. She is now having problems with activities of daily living as well as with sleep. Activity worsens her symptoms. Rest has been somewhat helpful. She is taking hydrocodone 10/325 up to 3 a day as well as Xanax and Cymbalta. She does have a history of fibromyalgia but this pain is certainly more debilitating in comparison to her fibromyalgia. She has been tried with corticosteroid  injections as well as viscosupplementation and is presently having less effect   HOSPITAL COURSE:  JALEYA PEBLEY is a 62 y.o. admitted on 10/02/2012 and found to have a diagnosis of Right Knee Osteoarthrosis.  After appropriate laboratory studies were obtained  they were taken to the operating room on 10/02/2012 and underwent  Procedure(s): TOTAL KNEE ARTHROPLASTY Right.   They were given perioperative antibiotics:  Anti-infectives   Start     Dose/Rate Route Frequency Ordered Stop   10/02/12 1330  ceFAZolin (ANCEF) IVPB 2 g/50 mL premix     2 g 100 mL/hr over 30 Minutes Intravenous Every 6 hours 10/02/12 1203 10/02/12 2000   10/02/12 0600  ceFAZolin (ANCEF) IVPB 2 g/50 mL premix     2 g 100 mL/hr over 30 Minutes Intravenous On call to O.R. 10/01/12 1419 10/02/12 0740    .  Tolerated the procedure well.  Placed with a foley intraoperatively.  Given Ofirmev at induction and for 24 hours.  Toradol was given post op. CPM 0-60 degrees.  POD #1, allowed out of bed to a chair.  PT for ambulation and exercise program.  Foley D/C'd in morning.  IV saline locked.  O2 discontionued.  POD #2, continued PT and ambulation.   Hemovac pulled. Has done remarkably with her  PT.  Pain well controlled . The remainder of the hospital course was dedicated to ambulation and strengthening.   The patient was discharged on 2 Days Post-Op in  Stable condition.  Blood products given:none  DIAGNOSTIC STUDIES: Recent vital signs: Patient Vitals for the past 24 hrs:  BP Temp Temp src Pulse Resp SpO2 Height Weight  10/04/12 0800 - - - - 15 100 % - -  10/04/12 0603 120/58 mmHg 98.3 F (36.8 C) Oral 80 20 100 % - -  10/04/12 0400 - - - - 16 - - -  10/04/12 0000 - - - - 18 100 % - -  10/03/12 2134 - - - - - - 5\' 6"  (1.676 m) 92.987 kg (205 lb)  10/03/12 2116 114/54 mmHg 98.5 F (36.9 C) - 98 18 100 % - -  10/03/12 2000 - - - - 18 99 % - -  10/03/12 1600 - - - - 18 - - -       Recent laboratory studies:  Recent  Labs  10/03/12 0455 10/04/12 0405  WBC 18.5* 10.7*  HGB 12.8 10.5*  HCT 38.2 31.6*  PLT 264 165    Recent Labs  10/03/12 0455 10/04/12 0405  NA 139 138  K 3.9 4.4  CL 100 103  CO2 26 26  BUN 13 11  CREATININE 0.66 0.55  GLUCOSE 137* 93  CALCIUM 9.7 8.5   Lab Results  Component Value Date   INR 0.98 09/25/2012     Recent Radiographic Studies :   Chest 2 View  09/25/2012  *RADIOLOGY REPORT*  Clinical Data: Preop for right knee arthroplasty  CHEST - 2 VIEW  Comparison: None.  Findings: Cardiomediastinal silhouette is unremarkable.  No acute infiltrate or pleural effusion.  Metallic anchors are seen bilateral proximal humerus.  Mild degenerative changes thoracic spine.  No pulmonary edema.  IMPRESSION: No active disease.  Degenerative changes thoracic spine.   Original Report Authenticated By: Natasha Mead, M.D.     DISCHARGE INSTRUCTIONS: Discharge Orders   Future Orders Complete By Expires     CPM  As directed     Comments:      Continuous passive motion machine (CPM):      Use the CPM from 0 to 60 for 6-8 hours per day.      You may increase by 5-10 per day.  You may break it up into 2 or 3 sessions per day.      Use CPM for 3-4 weeks or until you are told to stop.    Call MD / Call 911  As directed     Comments:      If you experience chest pain or shortness of breath, CALL 911 and be transported to the hospital emergency room.  If you develope a fever above 101 F, pus (white drainage) or increased drainage or redness at the wound, or calf pain, call your surgeon's office.    Change dressing  As directed     Comments:      Change dressing on Sunday, then change the dressing daily with sterile 4 x 4 inch gauze dressing and apply TED hose.  You may clean the incision with alcohol prior to redressing.    Constipation Prevention  As directed     Comments:      Drink plenty of fluids.  Prune juice and/or coffee may be helpful.  You may use a stool softener, such as Colace  (over the counter) 100 mg twice  a day.  Use MiraLax (over the counter) for constipation as needed but this may take several days to work.  Mag Citrate --OR-- Milk of Magnesia may also be used but follow directions on the label.    Diet general  As directed     Do not put a pillow under the knee. Place it under the heel.  As directed     Driving restrictions  As directed     Comments:      No driving for 6 weeks    Increase activity slowly as tolerated  As directed     Lifting restrictions  As directed     Comments:      No lifting for 6 weeks    Partial weight bearing  As directed     Comments:      50 % WEIGHT BEARING AS TAUGHT IN PHYSICAL THERAPY    Patient may shower  As directed     Comments:      You may shower over the brown dressing.  Once the dressing is removed you may shower without a dressing once there is no drainage.  Do not wash over the wound.  If drainage remains, cover wound with plastic wrap and then shower.    TED hose  As directed     Comments:      Use stockings (TED hose) for 2 weeks on operative leg(s).  You may remove them at night for sleeping.       DISCHARGE MEDICATIONS:     Medication List    STOP taking these medications       HYDROcodone-acetaminophen 10-325 MG per tablet  Commonly known as:  NORCO     meloxicam 15 MG tablet  Commonly known as:  MOBIC      TAKE these medications       ALPRAZolam 0.5 MG tablet  Commonly known as:  XANAX  Take 0.25-0.5 mg by mouth 2 (two) times daily as needed for sleep or anxiety.     amphetamine-dextroamphetamine 10 MG tablet  Commonly known as:  ADDERALL  Take 10 mg by mouth 2 (two) times daily.     DULoxetine 60 MG capsule  Commonly known as:  CYMBALTA  Take 60 mg by mouth at bedtime.     methocarbamol 500 MG tablet  Commonly known as:  ROBAXIN  Take 1 tablet (500 mg total) by mouth every 6 (six) hours as needed.     mupirocin ointment 2 %  Commonly known as:  BACTROBAN  Apply 1 application  topically 2 (two) times daily.     oxyCODONE 5 MG immediate release tablet  Commonly known as:  Oxy IR/ROXICODONE  Take 1-2 tablets (5-10 mg total) by mouth every 4 (four) hours as needed for pain.     pramipexole 0.125 MG tablet  Commonly known as:  MIRAPEX  Take 0.25 mg by mouth daily at 12 noon.     rivaroxaban 10 MG Tabs tablet  Commonly known as:  XARELTO  Take 1 tablet (10 mg total) by mouth daily.        FOLLOW UP VISIT:       Follow-up Information   Follow up with Valeria Batman, MD On 10/17/2012.   Contact information:   [address] [place] [address] [phone]       DISPOSITION:   Duwaine Maxin, [place]  CONDITION:  Stable   Dimitri Shakespeare 10/04/2012, 3:27 PM

## 2012-10-04 NOTE — Progress Notes (Signed)
Patient ID: Laura Solis, female   DOB: 1951/01/14, 62 y.o.   MRN: [id number] PATIENT ID: Laura Solis        MRN:  [id number]          DOB/AGE: 09/10/1950 / 62 y.o.    Laura Campbell, MD   [name], PA-C [address] [place], [place]  [address]                             [phone]   PROGRESS NOTE  Subjective:  negative for Chest Pain  negative for Shortness of Breath  negative for Nausea/Vomiting   negative for Calf Pain    Tolerating Diet: yes         Patient reports pain as 4 on 0-10 scale.     Doing well, anxious for D/C to [facility] rehab  Objective: Vital signs in last 24 hours:   Patient Vitals for the past 24 hrs:  BP Temp Temp src Pulse Resp SpO2 Height Weight  10/04/12 0800 - - - - 15 100 % - -  10/04/12 0603 120/58 mmHg 98.3 F (36.8 C) Oral 80 20 100 % - -  10/04/12 0400 - - - - 16 - - -  10/04/12 0000 - - - - 18 100 % - -  10/03/12 2134 - - - - - - 5\' 6"  (1.676 m) 92.987 kg (205 lb)  10/03/12 2116 114/54 mmHg 98.5 F (36.9 C) - 98 18 100 % - -  10/03/12 2000 - - - - 18 99 % - -  10/03/12 1600 - - - - 18 - - -      Intake/Output from previous day:   03/05 0701 - 03/06 0700 In: 1320 [P.O.:1320] Out: 50 [Drains:50]   Intake/Output this shift:   03/06 0701 - 03/06 1900 In: -  Out: 25 [Drains:25]   Intake/Output     03/05 0701 - 03/06 0700 03/06 0701 - 03/07 0700   P.O. 1320    I.V. (mL/kg)     Total Intake(mL/kg) 1320 (14.2)    Urine (mL/kg/hr)     Drains 50 (0) 25 (0)   Blood     Total Output 50 25   Net +1270 -25        Urine Occurrence 2 x    Stool Occurrence 1 x       LABORATORY DATA:  Recent Labs  10/03/12 0455 10/04/12 0405  WBC 18.5* 10.7*  HGB 12.8 10.5*  HCT 38.2 31.6*  PLT 264 165    Recent Labs  10/03/12 0455 10/04/12 0405  NA 139 138  K 3.9 4.4  CL 100 103  CO2 26 26  BUN 13 11  CREATININE 0.66 0.55  GLUCOSE 137* 93  CALCIUM 9.7 8.5   Lab Results  Component Value Date   INR 0.98 09/25/2012     Recent Radiographic Studies :   Chest 2 View  09/25/2012  *RADIOLOGY REPORT*  Clinical Data: Preop for right knee arthroplasty  CHEST - 2 VIEW  Comparison: None.  Findings: Cardiomediastinal silhouette is unremarkable.  No acute infiltrate or pleural effusion.  Metallic anchors are seen bilateral proximal humerus.  Mild degenerative changes thoracic spine.  No pulmonary edema.  IMPRESSION: No active disease.  Degenerative changes thoracic spine.   Original Report Authenticated By: Natasha Mead, M.D.      Examination:  General appearance: alert, cooperative and no distress  Wound Exam: clean, dry,  intact   Drainage:  None: wound tissue dry  Motor Exam: EHL, FHL, Anterior Tibial and Posterior Tibial Intact  Sensory Exam: Superficial Peroneal, Deep Peroneal and Tibial normal  Vascular Exam: Normal  Assessment:    2 Days Post-Op  Procedure(s) (LRB): TOTAL KNEE ARTHROPLASTY (Right)  ADDITIONAL DIAGNOSIS:  Principal Problem:   Osteoarthritis of right knee Active Problems:   Fibromyalgia syndrome   Obesity (BMI 30.0-34.9)  Hypokalemia-resolved   Plan: Physical Therapy as ordered Partial Weight Bearing @ 50% (PWB)  DVT Prophylaxis:  Xarelto  DISCHARGE PLAN: Skilled Nursing Facility/Rehab  DISCHARGE NEEDS: has equipment at home  Plan to D/C to rehab facility in [place] this afternoon-will F/U in [place] in 2 weeks      [name], [name] 10/04/2012 3:22 PM

## 2012-10-04 NOTE — Progress Notes (Signed)
Pt discharged to SNF with her daughter as transport. She has been provided with discharge instructions and follow up information.

## 2014-08-01 HISTORY — PX: EYE SURGERY: SHX253

## 2016-11-02 ENCOUNTER — Ambulatory Visit (INDEPENDENT_AMBULATORY_CARE_PROVIDER_SITE_OTHER): Payer: Medicare Other | Admitting: Orthopaedic Surgery

## 2016-11-02 ENCOUNTER — Encounter (INDEPENDENT_AMBULATORY_CARE_PROVIDER_SITE_OTHER): Payer: Self-pay | Admitting: Orthopaedic Surgery

## 2016-11-02 ENCOUNTER — Ambulatory Visit (INDEPENDENT_AMBULATORY_CARE_PROVIDER_SITE_OTHER): Payer: Medicare Other

## 2016-11-02 VITALS — BP 152/73 | HR 85 | Resp 14 | Ht 65.5 in | Wt 210.0 lb

## 2016-11-02 DIAGNOSIS — M25551 Pain in right hip: Secondary | ICD-10-CM

## 2016-11-02 DIAGNOSIS — M545 Low back pain: Secondary | ICD-10-CM

## 2016-11-02 DIAGNOSIS — M79604 Pain in right leg: Secondary | ICD-10-CM

## 2016-11-02 NOTE — Progress Notes (Signed)
Office Visit Note   Patient: Laura Solis           Date of Birth: 06/02/1951           MRN: [id number] Visit Date: 11/02/2016              Requested by: Joaquin Courts, DO [address] [place], [place] [address] PCP: Joaquin Courts, DO   Assessment & Plan: Visit Diagnoses: Chronic low back pain with prior surgical recommendation per Dr. Krista Blue in [place]. Clinical history is consistent with neurologic claudication. Prior studies not available for review   Plan: MRI scan lumbar spine   Follow-Up Instructions: No Follow-up on file.   Orders:  No orders of the defined types were placed in this encounter.  No orders of the defined types were placed in this encounter.     Procedures: No procedures performed   Clinical Data: No additional findings.   Subjective: No chief complaint on file.   Laura Solis is a 66 year old female that presents with chronic right hip pain and radiates to knee then foot. She relates she has fibromyalgia, numbness and tingling in both feet mostly at night. Denies fever, bowel issues or groin pain. Her mother also has the same fibromyalgia where she could not walk. Pt is not diabetic Pt had a Right total knee replacement in 2014  Laura Solis is experiencing pain that seems to originate in her low back radiating to her right hip and as far distally as the right proximal tibia laterally. She's having more back than leg pain. Oftentimes she has a sensation of her right leg "giving out. She has been evaluated in the past and has had a series of injections for a" period of years" in [place]. Her physician had suggested surgery but she was hesitant to consider that surgical option. Presently she is not taking any medicines or any active treatment for her back pain.  Review of Systems   Objective: Vital Signs: There were no vitals taken for this visit.  Physical Exam  Ortho Exam straight leg raise is negative  bilaterally. Reflexes appear to be symmetrical. No swelling distally. Sensory exam intact. Mild percussible tenderness of lower lumbar spine. This range of motion of both hips.  Specialty Comments:  No specialty comments available.  Imaging: No results found.   PMFS History: Patient Active Problem List   Diagnosis Date Noted  . Osteoarthritis of right knee 10/03/2012  . Fibromyalgia syndrome 10/03/2012  . Obesity (BMI 30.0-34.9) 10/03/2012   Past Medical History:  Diagnosis Date  . ADHD (attention deficit hyperactivity disorder)   . Anxiety   . Arthritis   . Fibromyalgia   . Headache   . Pneumonia   . PONV (postoperative nausea and vomiting)     No family history on file.  Past Surgical History:  Procedure Laterality Date  . ABDOMINAL HYSTERECTOMY    . CARPAL TUNNEL RELEASE     bil  . CYST EXCISION  behine ear,coxxyx  . DILATION AND CURETTAGE OF UTERUS    . ROTATOR CUFF REPAIR    . TOTAL KNEE ARTHROPLASTY Right 10/02/2012   Dr Cleophas Dunker  . TOTAL KNEE ARTHROPLASTY Right 10/02/2012   Procedure: TOTAL KNEE ARTHROPLASTY;  Surgeon: Valeria Batman, MD;  [facility];  Service: Orthopedics;  Laterality: Right;  Right Total Knee Arthroplasty   Social History   Occupational History  . Not on file.   Social History Main Topics  . Smoking  status: Never Smoker  . Smokeless tobacco: Never Used  . Alcohol use No  . Drug use: No  . Sexual activity: Not on file

## 2016-11-09 ENCOUNTER — Ambulatory Visit (INDEPENDENT_AMBULATORY_CARE_PROVIDER_SITE_OTHER): Payer: Medicare Other | Admitting: Orthopaedic Surgery

## 2016-11-09 ENCOUNTER — Encounter (INDEPENDENT_AMBULATORY_CARE_PROVIDER_SITE_OTHER): Payer: Self-pay | Admitting: Orthopaedic Surgery

## 2016-11-09 DIAGNOSIS — M5441 Lumbago with sciatica, right side: Secondary | ICD-10-CM

## 2016-11-09 DIAGNOSIS — G8929 Other chronic pain: Secondary | ICD-10-CM

## 2016-11-09 NOTE — Progress Notes (Signed)
   Office Visit Note   Patient: Laura Solis           Date of Birth: [birth year]           MRN: [id number] Visit Date: 11/09/2016              Requested by: Laura Courts, DO [address] [place], [place] [address] PCP: Laura Courts, DO   Assessment & Plan: Visit Diagnoses:  1. Chronic bilateral low back pain with right-sided sciatica   MRI with severe facet arthritis L3-4 and L4-5  Plan: refer to Dr Ophelia Charter for surgical evaluation.Long discussion over 30 minutes regarding pathology, treatment options including possible surgery Laura Solis would like to the surgical option.  Follow-Up Instructions: Return refer to Dr Ophelia Charter.   Orders:  No orders of the defined types were placed in this encounter.  No orders of the defined types were placed in this encounter.     Procedures: No procedures performed   Clinical Data: No additional findings. 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 Bilateral foraminal stenosis at L3 left greater than right. No neural impingement or spinal stenosis at L4-5. Auto fusion at L5-S1  Subjective: Chief Complaint  Patient presents with  . Right Hip - Results  Chronic history of LBP with occasional right LE and thigh discomfort. Has been evaluated by MD in [place] with prior injections. Pain always returns to point of compromise. Lives by herself and tired of chronic pain. Denies bowel or bladder problems  HPI  Review of Systems   Objective: Vital Signs: There were no vitals taken for this visit.  Physical Exam  Ortho Exam straight leg raise negative bilaterally. Reflexes are symmetrical. Motor exam intact. Painless range of motion of both hips and both knees. Prior pelvic films without evidence of obvious arthritis of either hip. Mild percussible tenderness of lower lumbar spine. Cross leg test negative.  Specialty Comments:    No specialty comments available.  Imaging: No results found.   PMFS History: Patient Active Problem List   Diagnosis Date Noted  . Osteoarthritis of right knee 10/03/2012  . Fibromyalgia syndrome 10/03/2012  . Obesity (BMI 30.0-34.9) 10/03/2012   Past Medical History:  Diagnosis Date  . ADHD (attention deficit hyperactivity disorder)   . Anxiety   . Arthritis   . Fibromyalgia   . Headache(784.0)   . Pneumonia   . PONV (postoperative nausea and vomiting)     History reviewed. No pertinent family history.  Past Surgical History:  Procedure Laterality Date  . ABDOMINAL HYSTERECTOMY    . CARPAL TUNNEL RELEASE     bil  . CYST EXCISION  behine ear,coxxyx  . DILATION AND CURETTAGE OF UTERUS    . ROTATOR CUFF REPAIR    . TOTAL KNEE ARTHROPLASTY Right 10/02/2012   Dr Cleophas Dunker  . TOTAL KNEE ARTHROPLASTY Right 10/02/2012   Procedure: TOTAL KNEE ARTHROPLASTY;  Surgeon: Valeria Batman, MD;  Location: [facility] OR;  Service: Orthopedics;  Laterality: Right;  Right Total Knee Arthroplasty   Social History   Occupational History  . Not on file.   Social History Main Topics  . Smoking status: Never Smoker  . Smokeless tobacco: Never Used  . Alcohol use No  . Drug use: No  . Sexual activity: Not on file

## 2016-11-10 ENCOUNTER — Encounter (INDEPENDENT_AMBULATORY_CARE_PROVIDER_SITE_OTHER): Payer: Self-pay | Admitting: Orthopaedic Surgery

## 2016-11-10 ENCOUNTER — Ambulatory Visit (INDEPENDENT_AMBULATORY_CARE_PROVIDER_SITE_OTHER): Payer: Medicare Other | Admitting: Orthopaedic Surgery

## 2016-11-10 ENCOUNTER — Ambulatory Visit (INDEPENDENT_AMBULATORY_CARE_PROVIDER_SITE_OTHER): Payer: Medicare Other

## 2016-11-10 VITALS — Ht 65.5 in | Wt 210.0 lb

## 2016-11-10 DIAGNOSIS — M5441 Lumbago with sciatica, right side: Secondary | ICD-10-CM

## 2016-11-11 NOTE — Progress Notes (Signed)
Office Visit Note   Patient: Laura Solis           Date of Birth: [birth year]           MRN: [id number] Visit Date: 11/10/2016              Requested by: Joaquin Courts, DO [address] [place], [place] [address] PCP: Joaquin Courts, DO   Assessment & Plan: Visit Diagnoses:  1. Acute bilateral low back pain with right-sided sciatica   2.     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 L4-5 level is unstable. 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 3. Autofusion L5-S1  Plan: Patient is autofusion at L5-S1. Plan would be to level decompression and fusion at L3-4 and L4-5 with T left procedure and pedicle instrumentation. We discussed use of Cell Saver to help with intraoperative blood loss. Risks of pseudoarthrosis, malposition of pedicle screws, reoperation, potential for backout of the cages, problems with the fixation was softer bone that can occur with aging. Risks of dural tear and repair was discussed. Risks of infection was discussed. She has had a MRSA infection in the past. Appropriate antibiotics would be needed as well as a preoperative PCR. Postoperative brace use of walker was discussed. She understands this would take a few months for this to heal solidly. She would need to have someone arranged to be at home with her if she left the hospital after 2 days. Questions were elicited and answered she understands and requests we proceed.  Follow-Up Instructions: No Follow-up on file.   Orders:  Orders Placed This Encounter  Procedures  . XR Lumbar Spine 2-3 Views   No orders of the defined types were placed in this encounter.     Procedures: No procedures performed   Clinical Data: No additional findings.   Subjective: Chief Complaint  Patient presents with  . Lower Back - Pain    HPI patient's had greater than 1 year history of back pain leg pain with  leg weakness worse on the right than left with numbness in her thighs with standing and attempts at ambulating. She can walk only 100 feet has to stop sitdown 445 minutes and then repeat. She does well if she leans over grocery cart and can go to the whole store. Without the grocery cart she cannot make it. She's had an MRI scan that shows two-level anterolisthesis at L3-4 and L4-5 with severe foraminal stenosis at L3-4 level and 6 mm shifting at L4-5. This scan was done on 11/08/2016 at [facility] in [place]. She denies bowel or bladder symptoms. No fever or chills. No associated neck pain no numbness or tingling in her hands she's had previous carpal tunnel releases. She is concerned with her leg weakness with attempted ambulation she will fall when she was by herself. She gets complete relief with sitting or supine position.  Review of Systems 14 review of systems include previous right total knee arthroplasty 2014 by Dr. Cleophas Dunker doing well. Previous hysterectomy, bilateral carpal tunnel releases. Bilateral rotator cuff repairs. Pilonidal cyst removal, tubal ligation, appendectomy in the distant past. Excision of a growth posterior to the right ear. She pneumonia with bronchitis 6 years ago. Problems with internal hemorrhoids. Remote kidney infection 4 years ago. History of headaches mild depression and borderline sleep apnea.   Objective: Vital Signs: Ht 5' 5.5" (1.664 m)   Wt 210 lb (95.3 kg)  BMI 34.41 kg/m   Physical Exam  Constitutional: She is oriented to person, place, and time. She appears well-developed.  HENT:  Head: Normocephalic.  Right Ear: External ear normal.  Left Ear: External ear normal.  Eyes: Pupils are equal, round, and reactive to light.  Neck: No tracheal deviation present. No thyromegaly present.  Cardiovascular: Normal rate.   Pulmonary/Chest: Effort normal.  Abdominal: Soft.  Musculoskeletal:  Well-healed right total knee arthroplasty without effusion.  Flexion full extension good quad strength. She has sciatic notch tenderness more on the right than left negative straight leg raising 90. Anterior tib EHL is intact.  Neurological: She is alert and oriented to person, place, and time.  Skin: Skin is warm and dry.  Psychiatric: She has a normal mood and affect. Her behavior is normal.    Ortho Exam intact knee and ankle jerk. No pain with hip range of motion. Contralateral negative straight leg raising. Tenderness with palpation and percussion over the lumbosacral junction tenderness L4 and L5 spinous processes.  Specialty Comments:  No specialty comments available.  Imaging: MRI scan 11/08/2016 read by Dr. Francene Boyers shows severe facet arthritis L3-4 L4-5 with spondylolisthesis at both levels. Bilateral foraminal stenosis at L3-4, left greater than right. However in either orbit both L3 nerve roots could be affected. No neural impingement or spinal stenosis at L4-5. There is autofusion at L5-S1. Small disc protrusion T12-L1 without compression. Tiny disc at L1-2 on the left and L2-3 on the right without impingement at either level. The L3-4 level is 2.5 mm of anterolisthesis and there is 6 mm anterolisthesis of L4-5. Scan was done at [facility] in [place].   PMFS History: Patient Active Problem List   Diagnosis Date Noted  . Osteoarthritis of right knee 10/03/2012  . Fibromyalgia syndrome 10/03/2012  . Obesity (BMI 30.0-34.9) 10/03/2012   Past Medical History:  Diagnosis Date  . ADHD (attention deficit hyperactivity disorder)   . Anxiety   . Arthritis   . Fibromyalgia   . Headache(784.0)   . Pneumonia   . PONV (postoperative nausea and vomiting)     No family history on file.  Past Surgical History:  Procedure Laterality Date  . ABDOMINAL HYSTERECTOMY    . CARPAL TUNNEL RELEASE     bil  . CYST EXCISION  behine ear,coxxyx  . DILATION AND CURETTAGE OF UTERUS    . ROTATOR CUFF REPAIR    . TOTAL KNEE ARTHROPLASTY Right  10/02/2012   Dr Cleophas Dunker  . TOTAL KNEE ARTHROPLASTY Right 10/02/2012   Procedure: TOTAL KNEE ARTHROPLASTY;  Surgeon: Valeria Batman, MD;  Location: [facility] OR;  Service: Orthopedics;  Laterality: Right;  Right Total Knee Arthroplasty   Social History   Occupational History  . Not on file.   Social History Main Topics  . Smoking status: Never Smoker  . Smokeless tobacco: Never Used  . Alcohol use No  . Drug use: No  . Sexual activity: Not on file

## 2016-11-14 DIAGNOSIS — M1990 Unspecified osteoarthritis, unspecified site: Secondary | ICD-10-CM | POA: Insufficient documentation

## 2016-11-14 DIAGNOSIS — F419 Anxiety disorder, unspecified: Secondary | ICD-10-CM | POA: Insufficient documentation

## 2016-11-14 DIAGNOSIS — F418 Other specified anxiety disorders: Secondary | ICD-10-CM | POA: Insufficient documentation

## 2016-11-14 DIAGNOSIS — G2581 Restless legs syndrome: Secondary | ICD-10-CM | POA: Insufficient documentation

## 2016-11-14 DIAGNOSIS — F909 Attention-deficit hyperactivity disorder, unspecified type: Secondary | ICD-10-CM | POA: Insufficient documentation

## 2016-12-09 ENCOUNTER — Other Ambulatory Visit (INDEPENDENT_AMBULATORY_CARE_PROVIDER_SITE_OTHER): Payer: Self-pay | Admitting: Orthopaedic Surgery

## 2016-12-09 DIAGNOSIS — M4316 Spondylolisthesis, lumbar region: Secondary | ICD-10-CM

## 2017-01-02 ENCOUNTER — Ambulatory Visit (HOSPITAL_COMMUNITY)
Admission: RE | Admit: 2017-01-02 | Discharge: 2017-01-02 | Disposition: A | Discharge status: Home / Self Care | Payer: Medicare Other | Source: Ambulatory Visit | Attending: Orthopaedic Surgery | Admitting: Orthopaedic Surgery

## 2017-01-02 ENCOUNTER — Encounter (HOSPITAL_COMMUNITY)
Admission: RE | Admit: 2017-01-02 | Discharge: 2017-01-02 | Disposition: A | Discharge status: Home / Self Care | Payer: Medicare Other | Source: Ambulatory Visit | Attending: Orthopaedic Surgery | Admitting: Orthopaedic Surgery

## 2017-01-02 ENCOUNTER — Encounter (HOSPITAL_COMMUNITY): Payer: Self-pay

## 2017-01-02 DIAGNOSIS — M48061 Spinal stenosis, lumbar region without neurogenic claudication: Secondary | ICD-10-CM

## 2017-01-02 DIAGNOSIS — Z0183 Encounter for blood typing: Secondary | ICD-10-CM | POA: Diagnosis not present

## 2017-01-02 DIAGNOSIS — Z01812 Encounter for preprocedural laboratory examination: Secondary | ICD-10-CM | POA: Insufficient documentation

## 2017-01-02 DIAGNOSIS — Z01818 Encounter for other preprocedural examination: Secondary | ICD-10-CM | POA: Insufficient documentation

## 2017-01-02 DIAGNOSIS — M4316 Spondylolisthesis, lumbar region: Secondary | ICD-10-CM | POA: Insufficient documentation

## 2017-01-02 LAB — COMPREHENSIVE METABOLIC PANEL
ALK PHOS: 102 U/L (ref 38–126)
ALT: 27 U/L (ref 14–54)
ANION GAP: 9 (ref 5–15)
AST: 29 U/L (ref 15–41)
Albumin: 3.5 g/dL (ref 3.5–5.0)
BILIRUBIN TOTAL: 0.4 mg/dL (ref 0.3–1.2)
BUN: 10 mg/dL (ref 6–20)
CALCIUM: 9.2 mg/dL (ref 8.9–10.3)
CO2: 26 mmol/L (ref 22–32)
Chloride: 105 mmol/L (ref 101–111)
Creatinine, Ser: 0.64 mg/dL (ref 0.44–1.00)
GLUCOSE: 133 mg/dL — AB (ref 65–99)
Potassium: 3.2 mmol/L — ABNORMAL LOW (ref 3.5–5.1)
Sodium: 140 mmol/L (ref 135–145)
TOTAL PROTEIN: 6.7 g/dL (ref 6.5–8.1)

## 2017-01-02 LAB — SURGICAL PCR SCREEN
MRSA, PCR: NEGATIVE
STAPHYLOCOCCUS AUREUS: POSITIVE — AB

## 2017-01-02 LAB — URINALYSIS, ROUTINE W REFLEX MICROSCOPIC
Bilirubin Urine: NEGATIVE
GLUCOSE, UA: 150 mg/dL — AB
Hgb urine dipstick: NEGATIVE
KETONES UR: NEGATIVE mg/dL
LEUKOCYTES UA: NEGATIVE
NITRITE: NEGATIVE
PROTEIN: NEGATIVE mg/dL
Specific Gravity, Urine: 1.024 (ref 1.005–1.030)
pH: 6 (ref 5.0–8.0)

## 2017-01-02 LAB — TYPE AND SCREEN
ABO/RH(D): A POS
Antibody Screen: NEGATIVE

## 2017-01-02 LAB — CBC
HEMATOCRIT: 41.8 % (ref 36.0–46.0)
HEMOGLOBIN: 13.4 g/dL (ref 12.0–15.0)
MCH: 31.1 pg (ref 26.0–34.0)
MCHC: 32.1 g/dL (ref 30.0–36.0)
MCV: 97 fL (ref 78.0–100.0)
Platelets: 238 10*3/uL (ref 150–400)
RBC: 4.31 MIL/uL (ref 3.87–5.11)
RDW: 13.5 % (ref 11.5–15.5)
WBC: 6.2 10*3/uL (ref 4.0–10.5)

## 2017-01-02 NOTE — Pre-Procedure Instructions (Addendum)
Laura Solis  01/02/2017      [id number] [place] [address] [place] [id number] Phone: [phone] Fax: [phone]    Your procedure is scheduled on 01/09/17  [facility] Admitting at 1030 A.M.  Call this number if you have problems the morning of surgery:  [phone]   Remember:  Do not eat food or drink liquids after midnight.  Take these medicines the morning of surgery with A SIP OF WATER     Xanax if needed  STOP all herbel meds, nsaids (aleve,naproxen,advil,ibuprofen)  prior to surgery meloxicam, naproxen, ibuprofen pm, all vitamins/supplements, aspirin   Do not wear jewelry, make-up or nail polish.  Do not wear lotions, powders, or perfumes, or deoderant.  Do not shave 48 hours prior to surgery.  Men may shave face and neck.  Do not bring valuables to the hospital.  [facility] is not responsible for any belongings or valuables.  Contacts, dentures or bridgework may not be worn into surgery.  Leave your suitcase in the car.  After surgery it may be brought to your room.  For patients admitted to the hospital, discharge time will be determined by your treatment team.  Patients discharged the day of surgery will not be allowed to drive home.   Special instructions:   Special Instructions: [facility] - Preparing for Surgery  Before surgery, you can play an important role.  Because skin is not sterile, your skin needs to be as free of germs as possible.  You can reduce the number of germs on you skin by washing with CHG (chlorahexidine gluconate) soap before surgery.  CHG is an antiseptic cleaner which kills germs and bonds with the skin to continue killing germs even after washing.  Please DO NOT use if you have an allergy to CHG or antibacterial soaps.  If your skin becomes reddened/irritated stop using the CHG and inform your nurse when you arrive at Short Stay.  Do not shave (including legs and  underarms) for at least 48 hours prior to the first CHG shower.  You may shave your face.  Please follow these instructions carefully:   1.  Shower with CHG Soap the night before surgery and the morning of Surgery.  2.  If you choose to wash your hair, wash your hair first as usual with your normal shampoo.  3.  After you shampoo, rinse your hair and body thoroughly to remove the Shampoo.  4.  Use CHG as you would any other liquid soap.  You can apply chg directly  to the skin and wash gently with scrungie or a clean washcloth.  5.  Apply the CHG Soap to your body ONLY FROM THE NECK DOWN.  Do not use on open wounds or open sores.  Avoid contact with your eyes ears, mouth and genitals (private parts).  Wash genitals (private parts)       with your normal soap.  6.  Wash thoroughly, paying special attention to the area where your surgery will be performed.  7.  Thoroughly rinse your body with warm water from the neck down.  8.  DO NOT shower/wash with your normal soap after using and rinsing off the CHG Soap.  9.  Pat yourself dry with a clean towel.            10.  Wear clean pajamas.            11.  Place clean sheets on your bed the night of your first shower and do not sleep with pets.  Day of Surgery  Do not apply any lotions/deodorants the morning of surgery.  Please wear clean clothes to the hospital/surgery center.  Please read over the fact sheets that you were given.

## 2017-01-06 MED ORDER — VANCOMYCIN HCL 10 G IV SOLR
1500.0000 mg | INTRAVENOUS | Status: AC
  Administered 2017-01-09: 1500 mg via INTRAVENOUS
  Filled 2017-01-06 (×2): qty 1500

## 2017-01-09 ENCOUNTER — Inpatient Hospital Stay (HOSPITAL_COMMUNITY): Payer: Medicare Other | Admitting: Emergency Medicine

## 2017-01-09 ENCOUNTER — Inpatient Hospital Stay (HOSPITAL_COMMUNITY): Payer: Medicare Other

## 2017-01-09 ENCOUNTER — Inpatient Hospital Stay (HOSPITAL_COMMUNITY)
Admission: RE | Disposition: A | Discharge status: Home Health | Payer: Self-pay | Source: Ambulatory Visit | Attending: Orthopaedic Surgery

## 2017-01-09 ENCOUNTER — Inpatient Hospital Stay (HOSPITAL_COMMUNITY): Payer: Medicare Other | Admitting: Anesthesiology

## 2017-01-09 ENCOUNTER — Encounter (HOSPITAL_COMMUNITY): Payer: Self-pay | Admitting: General Practice

## 2017-01-09 ENCOUNTER — Inpatient Hospital Stay (HOSPITAL_COMMUNITY)
Admission: RE | Admit: 2017-01-09 | Discharge: 2017-01-16 | DRG: 455 | Disposition: A | Discharge status: Home Health | Payer: Medicare Other | Source: Ambulatory Visit | Attending: Orthopaedic Surgery | Admitting: Orthopaedic Surgery

## 2017-01-09 DIAGNOSIS — F909 Attention-deficit hyperactivity disorder, unspecified type: Secondary | ICD-10-CM | POA: Diagnosis present

## 2017-01-09 DIAGNOSIS — Z419 Encounter for procedure for purposes other than remedying health state, unspecified: Secondary | ICD-10-CM

## 2017-01-09 DIAGNOSIS — Z96651 Presence of right artificial knee joint: Secondary | ICD-10-CM | POA: Diagnosis present

## 2017-01-09 DIAGNOSIS — Z888 Allergy status to other drugs, medicaments and biological substances status: Secondary | ICD-10-CM | POA: Diagnosis not present

## 2017-01-09 DIAGNOSIS — Z981 Arthrodesis status: Secondary | ICD-10-CM

## 2017-01-09 DIAGNOSIS — M797 Fibromyalgia: Secondary | ICD-10-CM | POA: Diagnosis present

## 2017-01-09 DIAGNOSIS — M48061 Spinal stenosis, lumbar region without neurogenic claudication: Secondary | ICD-10-CM | POA: Diagnosis present

## 2017-01-09 DIAGNOSIS — F419 Anxiety disorder, unspecified: Secondary | ICD-10-CM | POA: Diagnosis present

## 2017-01-09 DIAGNOSIS — M2578 Osteophyte, vertebrae: Secondary | ICD-10-CM | POA: Diagnosis present

## 2017-01-09 DIAGNOSIS — G8918 Other acute postprocedural pain: Secondary | ICD-10-CM

## 2017-01-09 DIAGNOSIS — Y839 Surgical procedure, unspecified as the cause of abnormal reaction of the patient, or of later complication, without mention of misadventure at the time of the procedure: Secondary | ICD-10-CM | POA: Diagnosis not present

## 2017-01-09 DIAGNOSIS — N9989 Other postprocedural complications and disorders of genitourinary system: Secondary | ICD-10-CM | POA: Diagnosis not present

## 2017-01-09 DIAGNOSIS — Z90710 Acquired absence of both cervix and uterus: Secondary | ICD-10-CM | POA: Diagnosis not present

## 2017-01-09 DIAGNOSIS — M48062 Spinal stenosis, lumbar region with neurogenic claudication: Secondary | ICD-10-CM | POA: Diagnosis not present

## 2017-01-09 DIAGNOSIS — R338 Other retention of urine: Secondary | ICD-10-CM

## 2017-01-09 DIAGNOSIS — M4316 Spondylolisthesis, lumbar region: Secondary | ICD-10-CM

## 2017-01-09 DIAGNOSIS — M545 Low back pain: Secondary | ICD-10-CM | POA: Diagnosis present

## 2017-01-09 HISTORY — DX: Headache, unspecified: R51.9

## 2017-01-09 HISTORY — DX: Other chronic pain: G89.29

## 2017-01-09 HISTORY — DX: Unspecified osteoarthritis, unspecified site: M19.90

## 2017-01-09 HISTORY — DX: Low back pain: M54.5

## 2017-01-09 HISTORY — DX: Low back pain, unspecified: M54.50

## 2017-01-09 HISTORY — PX: TRANSFORAMINAL LUMBAR INTERBODY FUSION (TLIF) WITH PEDICLE SCREW FIXATION 2 LEVEL: SHX6142

## 2017-01-09 HISTORY — DX: Headache: R51

## 2017-01-09 SURGERY — POSTERIOR LUMBAR FUSION 2 LEVEL
Anesthesia: General | Site: Spine Lumbar | Laterality: Left

## 2017-01-09 MED ORDER — METHOCARBAMOL 500 MG PO TABS
500.0000 mg | ORAL_TABLET | Freq: Four times a day (QID) | ORAL | Status: DC | PRN
  Administered 2017-01-10 – 2017-01-16 (×13): 500 mg via ORAL
  Filled 2017-01-09 (×15): qty 1

## 2017-01-09 MED ORDER — PRAMIPEXOLE DIHYDROCHLORIDE 0.125 MG PO TABS
0.2500 mg | ORAL_TABLET | Freq: Every day | ORAL | Status: DC
  Administered 2017-01-09 – 2017-01-15 (×7): 0.25 mg via ORAL
  Filled 2017-01-09 (×7): qty 2

## 2017-01-09 MED ORDER — PROPOFOL 10 MG/ML IV BOLUS
INTRAVENOUS | Status: AC
  Filled 2017-01-09: qty 20

## 2017-01-09 MED ORDER — HYDROMORPHONE HCL 1 MG/ML IJ SOLN: 0.5000 mg | INTRAMUSCULAR | Status: DC | PRN

## 2017-01-09 MED ORDER — KETOROLAC TROMETHAMINE 30 MG/ML IJ SOLN
INTRAMUSCULAR | Status: AC
  Administered 2017-01-09: 30 mg via INTRAVENOUS
  Filled 2017-01-09: qty 1

## 2017-01-09 MED ORDER — FENTANYL CITRATE (PF) 250 MCG/5ML IJ SOLN
INTRAMUSCULAR | Status: AC
  Filled 2017-01-09: qty 5

## 2017-01-09 MED ORDER — POLYETHYLENE GLYCOL 3350 17 G PO PACK
17.0000 g | PACK | Freq: Every day | ORAL | Status: DC | PRN
  Administered 2017-01-12: 17 g via ORAL
  Filled 2017-01-09: qty 1

## 2017-01-09 MED ORDER — SODIUM CHLORIDE 0.9% FLUSH
3.0000 mL | Freq: Two times a day (BID) | INTRAVENOUS | Status: DC
  Administered 2017-01-09 – 2017-01-15 (×7): 3 mL via INTRAVENOUS

## 2017-01-09 MED ORDER — PHENYLEPHRINE HCL 10 MG/ML IJ SOLN
INTRAMUSCULAR | Status: DC | PRN
  Administered 2017-01-09 (×8): 80 ug via INTRAVENOUS

## 2017-01-09 MED ORDER — HYDROMORPHONE HCL 1 MG/ML IJ SOLN
0.2500 mg | INTRAMUSCULAR | Status: DC | PRN
  Administered 2017-01-09: 0.25 mg via INTRAVENOUS

## 2017-01-09 MED ORDER — ACETAMINOPHEN 325 MG PO TABS
650.0000 mg | ORAL_TABLET | ORAL | Status: DC | PRN
  Administered 2017-01-13 – 2017-01-14 (×3): 650 mg via ORAL
  Filled 2017-01-09 (×5): qty 2

## 2017-01-09 MED ORDER — ONDANSETRON HCL 4 MG/2ML IJ SOLN
INTRAMUSCULAR | Status: AC
  Filled 2017-01-09: qty 2

## 2017-01-09 MED ORDER — ONDANSETRON HCL 4 MG PO TABS: 4.0000 mg | ORAL_TABLET | Freq: Four times a day (QID) | ORAL | Status: DC | PRN

## 2017-01-09 MED ORDER — ACETAMINOPHEN 650 MG RE SUPP: 650.0000 mg | RECTAL | Status: DC | PRN

## 2017-01-09 MED ORDER — CHLORHEXIDINE GLUCONATE 4 % EX LIQD: 60.0000 mL | Freq: Once | CUTANEOUS | Status: DC

## 2017-01-09 MED ORDER — ONDANSETRON HCL 4 MG/2ML IJ SOLN
INTRAMUSCULAR | Status: DC | PRN
  Administered 2017-01-09: 4 mg via INTRAVENOUS

## 2017-01-09 MED ORDER — PHENOL 1.4 % MT LIQD: 1.0000 | OROMUCOSAL | Status: DC | PRN

## 2017-01-09 MED ORDER — LIDOCAINE HCL (CARDIAC) 20 MG/ML IV SOLN
INTRAVENOUS | Status: DC | PRN
  Administered 2017-01-09: 60 mg via INTRAVENOUS

## 2017-01-09 MED ORDER — HEMOSTATIC AGENTS (NO CHARGE) OPTIME
TOPICAL | Status: DC | PRN
  Administered 2017-01-09: 1 via TOPICAL

## 2017-01-09 MED ORDER — SUGAMMADEX SODIUM 200 MG/2ML IV SOLN
INTRAVENOUS | Status: DC | PRN
  Administered 2017-01-09: 200 mg via INTRAVENOUS

## 2017-01-09 MED ORDER — HYDROMORPHONE HCL 1 MG/ML IJ SOLN
INTRAMUSCULAR | Status: AC
  Administered 2017-01-09: 0.25 mg via INTRAVENOUS
  Filled 2017-01-09: qty 0.5

## 2017-01-09 MED ORDER — KETOROLAC TROMETHAMINE 30 MG/ML IJ SOLN
30.0000 mg | Freq: Once | INTRAMUSCULAR | Status: AC
  Administered 2017-01-09: 30 mg via INTRAVENOUS

## 2017-01-09 MED ORDER — MIDAZOLAM HCL 5 MG/5ML IJ SOLN
INTRAMUSCULAR | Status: DC | PRN
  Administered 2017-01-09 (×2): 2 mg via INTRAVENOUS

## 2017-01-09 MED ORDER — PROPOFOL 10 MG/ML IV BOLUS
INTRAVENOUS | Status: DC | PRN
  Administered 2017-01-09: 130 mg via INTRAVENOUS

## 2017-01-09 MED ORDER — LACTATED RINGERS IV SOLN
INTRAVENOUS | Status: DC
  Administered 2017-01-09: 11:00:00 via INTRAVENOUS

## 2017-01-09 MED ORDER — MIDAZOLAM HCL 2 MG/2ML IJ SOLN
INTRAMUSCULAR | Status: AC
  Filled 2017-01-09: qty 2

## 2017-01-09 MED ORDER — FLUOXETINE HCL 20 MG PO CAPS
20.0000 mg | ORAL_CAPSULE | Freq: Every day | ORAL | Status: DC
  Administered 2017-01-09 – 2017-01-15 (×7): 20 mg via ORAL
  Filled 2017-01-09 (×7): qty 1

## 2017-01-09 MED ORDER — THROMBIN 20000 UNITS EX SOLR
CUTANEOUS | Status: AC
  Filled 2017-01-09: qty 20000

## 2017-01-09 MED ORDER — SODIUM CHLORIDE 0.9% FLUSH
3.0000 mL | INTRAVENOUS | Status: DC | PRN
  Administered 2017-01-14: 3 mL via INTRAVENOUS
  Filled 2017-01-09: qty 3

## 2017-01-09 MED ORDER — FENTANYL CITRATE (PF) 100 MCG/2ML IJ SOLN
INTRAMUSCULAR | Status: DC | PRN
  Administered 2017-01-09 (×2): 50 ug via INTRAVENOUS
  Administered 2017-01-09: 100 ug via INTRAVENOUS
  Administered 2017-01-09 (×6): 50 ug via INTRAVENOUS

## 2017-01-09 MED ORDER — DOCUSATE SODIUM 100 MG PO CAPS
100.0000 mg | ORAL_CAPSULE | Freq: Two times a day (BID) | ORAL | Status: DC
  Administered 2017-01-09 – 2017-01-16 (×14): 100 mg via ORAL
  Filled 2017-01-09 (×15): qty 1

## 2017-01-09 MED ORDER — ROCURONIUM BROMIDE 100 MG/10ML IV SOLN
INTRAVENOUS | Status: DC | PRN
  Administered 2017-01-09: 10 mg via INTRAVENOUS
  Administered 2017-01-09: 50 mg via INTRAVENOUS
  Administered 2017-01-09: 20 mg via INTRAVENOUS

## 2017-01-09 MED ORDER — OXYCODONE HCL 5 MG PO TABS
5.0000 mg | ORAL_TABLET | ORAL | Status: DC | PRN
  Administered 2017-01-09 – 2017-01-12 (×8): 10 mg via ORAL
  Filled 2017-01-09 (×8): qty 2

## 2017-01-09 MED ORDER — SCOPOLAMINE 1 MG/3DAYS TD PT72
MEDICATED_PATCH | TRANSDERMAL | Status: AC
  Filled 2017-01-09: qty 1

## 2017-01-09 MED ORDER — METHOCARBAMOL 1000 MG/10ML IJ SOLN
500.0000 mg | Freq: Four times a day (QID) | INTRAVENOUS | Status: DC | PRN
  Filled 2017-01-09: qty 5

## 2017-01-09 MED ORDER — SODIUM CHLORIDE 0.9 % IV SOLN: INTRAVENOUS | Status: DC

## 2017-01-09 MED ORDER — CEFAZOLIN SODIUM-DEXTROSE 1-4 GM/50ML-% IV SOLN
1.0000 g | Freq: Three times a day (TID) | INTRAVENOUS | Status: AC
  Administered 2017-01-09 – 2017-01-10 (×2): 1 g via INTRAVENOUS
  Filled 2017-01-09 (×2): qty 50

## 2017-01-09 MED ORDER — MENTHOL 3 MG MT LOZG: 1.0000 | LOZENGE | OROMUCOSAL | Status: DC | PRN

## 2017-01-09 MED ORDER — LACTATED RINGERS IV SOLN
INTRAVENOUS | Status: DC | PRN
  Administered 2017-01-09 (×4): via INTRAVENOUS

## 2017-01-09 MED ORDER — SODIUM CHLORIDE 0.9 % IV SOLN: 250.0000 mL | INTRAVENOUS | Status: DC

## 2017-01-09 MED ORDER — ALPRAZOLAM 0.5 MG PO TABS
0.5000 mg | ORAL_TABLET | Freq: Every day | ORAL | Status: DC
  Administered 2017-01-09 – 2017-01-15 (×7): 0.5 mg via ORAL
  Filled 2017-01-09 (×7): qty 1

## 2017-01-09 MED ORDER — BUPIVACAINE-EPINEPHRINE (PF) 0.25% -1:200000 IJ SOLN
INTRAMUSCULAR | Status: AC
  Filled 2017-01-09: qty 30

## 2017-01-09 MED ORDER — BUPIVACAINE HCL (PF) 0.25 % IJ SOLN
INTRAMUSCULAR | Status: DC | PRN
  Administered 2017-01-09: 30 mL

## 2017-01-09 MED ORDER — ACETAMINOPHEN 10 MG/ML IV SOLN
1000.0000 mg | Freq: Once | INTRAVENOUS | Status: AC
  Administered 2017-01-09: 1000 mg via INTRAVENOUS

## 2017-01-09 MED ORDER — PHENYLEPHRINE HCL 10 MG/ML IJ SOLN
INTRAVENOUS | Status: DC | PRN
  Administered 2017-01-09: 50 ug/min via INTRAVENOUS

## 2017-01-09 MED ORDER — ACETAMINOPHEN 10 MG/ML IV SOLN
INTRAVENOUS | Status: AC
  Administered 2017-01-09: 1000 mg via INTRAVENOUS
  Filled 2017-01-09: qty 100

## 2017-01-09 MED ORDER — ONDANSETRON HCL 4 MG/2ML IJ SOLN: 4.0000 mg | Freq: Four times a day (QID) | INTRAMUSCULAR | Status: DC | PRN

## 2017-01-09 MED ORDER — SCOPOLAMINE 1 MG/3DAYS TD PT72
1.0000 | MEDICATED_PATCH | TRANSDERMAL | Status: DC
  Administered 2017-01-09: 1.5 mg via TRANSDERMAL
  Filled 2017-01-09: qty 1

## 2017-01-09 MED ORDER — AMPHETAMINE-DEXTROAMPHETAMINE 10 MG PO TABS
10.0000 mg | ORAL_TABLET | Freq: Two times a day (BID) | ORAL | Status: DC
  Administered 2017-01-10 – 2017-01-16 (×7): 10 mg via ORAL
  Filled 2017-01-09 (×11): qty 1

## 2017-01-09 MED ORDER — 0.9 % SODIUM CHLORIDE (POUR BTL) OPTIME
TOPICAL | Status: DC | PRN
  Administered 2017-01-09: 1000 mL

## 2017-01-09 SURGICAL SUPPLY — 82 items
BANDAGE ACE 6X5 VEL STRL LF (GAUZE/BANDAGES/DRESSINGS) IMPLANT
BLADE CLIPPER SURG (BLADE) IMPLANT
BLADE SURG 10 STRL SS (BLADE) ×3 IMPLANT
BUR RND DIAMOND ELITE 4.0 (BURR) ×2 IMPLANT
BUR RND DIAMOND ELITE 4.0MM (BURR) ×1
BUR ROUND FLUTED 4 SOFT TCH (BURR) IMPLANT
BUR ROUND FLUTED 4MM SOFT TCH (BURR)
CAP SPINAL LOCKING TI (Cap) ×18 IMPLANT
CONNECTOR EXPEDIUM SFX SZ A6 (Orthopedic Implant) ×3 IMPLANT
CORDS BIPOLAR (ELECTRODE) IMPLANT
COVER BACK TABLE 80X110 HD (DRAPES) ×3 IMPLANT
COVER MAYO STAND STRL (DRAPES) ×3 IMPLANT
COVER SURGICAL LIGHT HANDLE (MISCELLANEOUS) ×3 IMPLANT
DERMABOND ADVANCED (GAUZE/BANDAGES/DRESSINGS) ×2
DERMABOND ADVANCED .7 DNX12 (GAUZE/BANDAGES/DRESSINGS) ×1 IMPLANT
DRAPE C-ARM 42X72 X-RAY (DRAPES) ×3 IMPLANT
DRAPE HALF SHEET 40X57 (DRAPES) ×6 IMPLANT
DRAPE INCISE IOBAN 66X45 STRL (DRAPES) ×3 IMPLANT
DRAPE MICROSCOPE LEICA (MISCELLANEOUS) ×3 IMPLANT
DRAPE ORTHO SPLIT 77X108 STRL (DRAPES)
DRAPE SURG 17X23 STRL (DRAPES) ×12 IMPLANT
DRAPE SURG ORHT 6 SPLT 77X108 (DRAPES) IMPLANT
DRSG EMULSION OIL 3X3 NADH (GAUZE/BANDAGES/DRESSINGS) IMPLANT
DRSG MEPILEX BORDER 4X4 (GAUZE/BANDAGES/DRESSINGS) IMPLANT
DRSG MEPILEX BORDER 4X8 (GAUZE/BANDAGES/DRESSINGS) ×3 IMPLANT
DRSG PAD ABDOMINAL 8X10 ST (GAUZE/BANDAGES/DRESSINGS) IMPLANT
DURAPREP 26ML APPLICATOR (WOUND CARE) ×3 IMPLANT
ELECT BLADE 4.0 EZ CLEAN MEGAD (MISCELLANEOUS) ×3
ELECT CAUTERY BLADE 6.4 (BLADE) ×3 IMPLANT
ELECT REM PT RETURN 9FT ADLT (ELECTROSURGICAL) ×3
ELECTRODE BLDE 4.0 EZ CLN MEGD (MISCELLANEOUS) ×1 IMPLANT
ELECTRODE REM PT RTRN 9FT ADLT (ELECTROSURGICAL) ×1 IMPLANT
EVACUATOR 1/8 PVC DRAIN (DRAIN) IMPLANT
GLOVE BIOGEL PI IND STRL 8 (GLOVE) ×2 IMPLANT
GLOVE BIOGEL PI INDICATOR 8 (GLOVE) ×4
GLOVE ORTHO TXT STRL SZ7.5 (GLOVE) ×6 IMPLANT
GOWN STRL REUS W/ TWL LRG LVL3 (GOWN DISPOSABLE) ×1 IMPLANT
GOWN STRL REUS W/ TWL XL LVL3 (GOWN DISPOSABLE) ×1 IMPLANT
GOWN STRL REUS W/TWL 2XL LVL3 (GOWN DISPOSABLE) ×3 IMPLANT
GOWN STRL REUS W/TWL LRG LVL3 (GOWN DISPOSABLE) ×2
GOWN STRL REUS W/TWL XL LVL3 (GOWN DISPOSABLE) ×2
HEMOSTAT SURGICEL 2X14 (HEMOSTASIS) IMPLANT
KIT BASIN OR (CUSTOM PROCEDURE TRAY) ×3 IMPLANT
KIT ROOM TURNOVER OR (KITS) ×3 IMPLANT
MANIFOLD NEPTUNE II (INSTRUMENTS) ×3 IMPLANT
MATRIX TAP 5MM ×3 IMPLANT
NDL SUT .5 MAYO 1.404X.05X (NEEDLE) IMPLANT
NEEDLE HYPO 25GX1X1/2 BEV (NEEDLE) ×3 IMPLANT
NEEDLE MAYO TAPER (NEEDLE)
NEEDLE SPNL 18GX3.5 QUINCKE PK (NEEDLE) ×6 IMPLANT
NS IRRIG 1000ML POUR BTL (IV SOLUTION) ×3 IMPLANT
PACK LAMINECTOMY ORTHO (CUSTOM PROCEDURE TRAY) ×3 IMPLANT
PAD ARMBOARD 7.5X6 YLW CONV (MISCELLANEOUS) ×6 IMPLANT
PATTIES SURGICAL .5 X.5 (GAUZE/BANDAGES/DRESSINGS) ×3 IMPLANT
PATTIES SURGICAL .5 X1 (DISPOSABLE) ×3 IMPLANT
PATTIES SURGICAL .75X.75 (GAUZE/BANDAGES/DRESSINGS) ×3 IMPLANT
ROD DEGEN MATRIX 60MM LUMBAR (Rod) ×6 IMPLANT
SCREW MATRIX MIS 6.0X40MM (Screw) ×18 IMPLANT
SPACER CONCORDE PRO 9X13X27MM (Spacer) ×3 IMPLANT
SPACER T-PAL PRO TI 10X8X28MM (Spacer) ×3 IMPLANT
SPACER TPAL 10MMX28MMX8MM (Spacer) IMPLANT
SPOGE SURGIFLO 8M (HEMOSTASIS) ×2
SPONGE LAP 18X18 X RAY DECT (DISPOSABLE) ×3 IMPLANT
SPONGE LAP 4X18 X RAY DECT (DISPOSABLE) ×3 IMPLANT
SPONGE SURGIFLO 8M (HEMOSTASIS) ×1 IMPLANT
SPONGE SURGIFOAM ABS GEL 100 (HEMOSTASIS) IMPLANT
STAPLER VISISTAT 35W (STAPLE) IMPLANT
SUT BONE WAX W31G (SUTURE) ×3 IMPLANT
SUT VIC AB 1 CTX 27 (SUTURE) ×6 IMPLANT
SUT VIC AB 2-0 CT1 27 (SUTURE) ×4
SUT VIC AB 2-0 CT1 TAPERPNT 27 (SUTURE) ×2 IMPLANT
SUT VIC AB 3-0 CT1 27 (SUTURE) ×4
SUT VIC AB 3-0 CT1 TAPERPNT 27 (SUTURE) ×2 IMPLANT
SUT VICRYL 0 TIES 12 18 (SUTURE) IMPLANT
SUT VICRYL 4-0 PS2 18IN ABS (SUTURE) ×3 IMPLANT
SUT VICRYL AB 2 0 TIES (SUTURE) IMPLANT
SYR CONTROL 10ML LL (SYRINGE) ×3 IMPLANT
TOWEL OR 17X24 6PK STRL BLUE (TOWEL DISPOSABLE) ×3 IMPLANT
TOWEL OR 17X26 10 PK STRL BLUE (TOWEL DISPOSABLE) ×3 IMPLANT
TRAY FOLEY W/METER SILVER 16FR (SET/KITS/TRAYS/PACK) ×3 IMPLANT
WATER STERILE IRR 1000ML POUR (IV SOLUTION) ×3 IMPLANT
YANKAUER SUCT BULB TIP NO VENT (SUCTIONS) ×3 IMPLANT

## 2017-01-09 NOTE — Op Note (Signed)
Preop diagnosis: L3-4 L4-5 spinal stenosis with L4-5 degenerative and anterolisthesis with lateral recess and foraminal stenosis.  Postop diagnosis same  Procedure : L3-4, L4-5  TLIF  fusion with interbody cages, local bone, pedicle instrumentation with cross-link, bilateral intertransverse process fusion.  microscope assisted lumbar decompression 2 level.  Surgeon: Annell GreeningMark Jelani Vreeland M.D.  Assistant: Zonia KiefJames Owens PA-C medically necessary and present for entire essential procedure with decompression cage positioning pedicle instrumentation.  Anesthesia general oral tracheal plus the Marcaine local.  Implants:Depuy Synthes matrix system with 6 x 40 screws at L3-L4 and L5 bilaterally. T PAl   Proti  928 mm cage at L4-5. Bullet Proti 13 x 27 lordotic cage at L3-4. 60 mm rods 2. Cross-link SFX A-6 size .   Procedure after induction general anesthesia after informed consent standard prepping draping was performed with patient on spine frame after Foley catheter been placed. Yellow foam pads are placed underneath the ulnar nerve rolled underneath the shoulders careful positioning. Prepping with DuraPrep preoperative vancomycin was given due to patient's multiple allergies. There squared with towels sterile skin marker at the midline crosshatching and Betadine Steri-Drape laminectomy sheets. 2 spinal needles were placed checked under the draped C-arm lateral adjusted and then after timeout procedure incision was made. Midline dissection subperiosteal dissection with Coker clamps placed on the air for planned the decompression and then the directly marking the bone. She was performed with the complete laminectomy at L4 and L3. There was the hypermobility of facets consistent with the instability on flexion extension x-rays at L4-5 with more severe stenosis at the L3-4 level. Thick chunks of ligament was removed patient had epidural steroid injections in the past. Facets removed on the left side L4-5 and L3-4 once the  disc was identified and the draped C-arm confirmed the appropriate level. Discectomy was performed and L4-5 level had the exposure once decompression was finished and movable of the disc. The ring curettes angled curettes straight curettes straight down might pituitary grasper used to upper. The endplate. Sequential progression from May 7 21829 trials showed a 9 gave a tight fit. Patient's on bone had been meticulously cleaned off of soft tissue cut into small pieces great not sized pieces and this was some meticulous sleep packed in the disc space with the Grieco protecting the dura. Some surgical foot been used for hemostasis as well as the small patties and at this point there was minimal blood loss approximately 100 mL. TheT-Pal Proti 9 x 28 mm banana cage was impacted checked under fluoroscopy just as it was almost even with the endplate and then advancing kicked over in good position. Identical procedure was repeated at the L3-4 level. We are using operative microscope careful protection of the dura and as the cage was being pushed and it caught one of the strings of the patties that was in the lateral gutter and pulled portion of the padding and with the cage as it was being impacted checking intermittently with fluoroscopy. T distracted be used to remove it in order make sure we had all padding is out and these were checked and correct. Cage was attempted to be replaced  after 7 trial was gently placed in confirm the path and is a cages being advance it violated the superior endplate of L4 and the angled. This is confirmed with the C-arm. Cage was removed additional bone was placed. Due to the angulation of the cage there is some difficulty removing it was removed with careful protection of the dura and  the then additional bone graft well which was available from the prior large facets that she had present that a been removed and extra bone was packed into place for the implant was positioned and then the  bullet 13 x 27 cage which was the 3 mm larger was inserted and care was taken to make sure ran up against these the endplate of L3. This was a countersunk checked under C-arm and then pedicle screws were placed with standard the positioning. The punch was used with all checking under C-arm followed by joystick which was handed advanced checking with pedicle feeler checking under C-arm with the the placed down at 40 mm. Tapping against feeling the small pedicle feeler feel along the medial aspect and inferior aspect of the pedicle to make sure there is no violation and then after tapping decortication of the the transverse process and then placement of the 6 x 40 mm screws. This was repeated on all sides. On the right side L4 and L5 cage looks slightly lateral in the pedicle but it was not medial and not interfering the pedicle. There was good stability and all screws had been palpated at the tip of more inside of bone at the tip of the screw. Due to the superior endplate violation that occurred at L4 cross-link was added tightened down securely and clicked. Prior to placing the cross-link the T left side was tightened first on the left side compressing gently making sure not to over compress due to the endplate failure at L4. Opposite right side was then tightened down securely final spot pictures taken with the pickups placed on the right side of the patient. Copious irrigation the gutters were checked to make sure there were no bone chips and then the remaining bone Chester PEG placed at on the transverse processes right and left for lateral gutter fusion for additional stability. Patient tolerated procedure well. Standard layer closure with #1 Vicryl in the deep fascia 2-0 Vicryl subtendinous tissue 3-0 Vicryl subarticular closure Rayette Mogg infiltration Dermabond and the skin and then a flex dressing. Patient just arrived in her care room and the neurologic exam will be completed a sinus patient is more awake.

## 2017-01-09 NOTE — Anesthesia Preprocedure Evaluation (Addendum)
Anesthesia Evaluation  Patient identified by MRN, date of birth, ID band Patient awake    Reviewed: Allergy & Precautions, H&P , NPO status , Patient's Chart, lab work & pertinent test results  History of Anesthesia Complications (+) PONV  Airway Mallampati: III  TM Distance: >3 FB Neck ROM: Full    Dental no notable dental hx. (+) Teeth Intact, Dental Advisory Given   Pulmonary neg pulmonary ROS,    Pulmonary exam normal breath sounds clear to auscultation       Cardiovascular negative cardio ROS   Rhythm:Regular Rate:Normal     Neuro/Psych Anxiety negative neurological ROS  negative psych ROS   GI/Hepatic negative GI ROS, Neg liver ROS,   Endo/Other  negative endocrine ROS  Renal/GU negative Renal ROS  negative genitourinary   Musculoskeletal  (+) Arthritis , Osteoarthritis,  Fibromyalgia -  Abdominal   Peds  Hematology negative hematology ROS (+)   Anesthesia Other Findings   Reproductive/Obstetrics negative OB ROS                            Anesthesia Physical Anesthesia Plan  ASA: II  Anesthesia Plan: General   Post-op Pain Management:    Induction: Intravenous  PONV Risk Score and Plan: 4 or greater and Ondansetron, Dexamethasone, Propofol, Midazolam, Scopolamine patch - Pre-op and Diphenhydramine  Airway Management Planned: Oral ETT  Additional Equipment:   Intra-op Plan:   Post-operative Plan: Extubation in OR  Informed Consent: I have reviewed the patients History and Physical, chart, labs and discussed the procedure including the risks, benefits and alternatives for the proposed anesthesia with the patient or authorized representative who has indicated his/her understanding and acceptance.   Dental advisory given  Plan Discussed with: CRNA  Anesthesia Plan Comments:        Anesthesia Quick Evaluation

## 2017-01-09 NOTE — Transfer of Care (Signed)
Immediate Anesthesia Transfer of Care Note  Patient: Laura Solis  Procedure(s) Performed: Procedure(s): Left L3-4, L4-5 Transforaminal Lumbar Interbody Fusion, Pedicle Instrumentation (Left)  Patient Location: PACU  Anesthesia Type:General  Level of Consciousness: awake  Airway & Oxygen Therapy: Patient Spontanous Breathing  Post-op Assessment: Report given to RN and Post -op Vital signs reviewed and stable  Post vital signs: Reviewed and stable  Last Vitals:  Vitals:   01/09/17 1036  BP: 127/66  Pulse: 85  Resp: 18  Temp: 36.8 C    Last Pain:  Vitals:   01/09/17 1042  TempSrc:   PainSc: 7       Patients Stated Pain Goal: 3 (01/09/17 1042)  Complications: No apparent anesthesia complications

## 2017-01-09 NOTE — Anesthesia Postprocedure Evaluation (Signed)
Anesthesia Post Note  Patient: Ulice BoldJoan M Idleman  Procedure(s) Performed: Procedure(s) (LRB): Left L3-4, L4-5 Transforaminal Lumbar Interbody Fusion, Pedicle Instrumentation (Left)     Patient location during evaluation: PACU Anesthesia Type: General Level of consciousness: awake, awake and alert, oriented and sedated Pain management: pain level controlled Vital Signs Assessment: post-procedure vital signs reviewed and stable Respiratory status: spontaneous breathing, nonlabored ventilation, respiratory function stable and patient connected to nasal cannula oxygen Cardiovascular status: blood pressure returned to baseline Anesthetic complications: no    Last Vitals:  Vitals:   01/09/17 1930 01/09/17 1932  BP:    Pulse: 92 94  Resp: 17 20  Temp:      Last Pain:  Vitals:   01/09/17 1042  TempSrc:   PainSc: 7                  Adhvik Canady COKER

## 2017-01-09 NOTE — Brief Op Note (Signed)
01/09/2017  6:46 PM  PATIENT:  Laura Solis  66 y.o. female  PRE-OPERATIVE DIAGNOSIS:  L3-4, L4-5 Degenerative Spondylolisthesis, Foraminal Stenosis  POST-OPERATIVE DIAGNOSIS:  Lumbar three-four, Lumbar four-five degenerative spondylolisthesis, foraminal stenosis   PROCEDURE:  Procedure(s): Left L3-4, L4-5 Transforaminal Lumbar Interbody Fusion, Pedicle Instrumentation (Left) cages , lateral fusion. With crosslink  SURGEON:  Surgeon(s) and Role:    * Eldred MangesYates, Mark C, MD - Primary  PHYSICIAN ASSISTANT: Zonia KiefJames Owens PA-C  ASSISTANTS: none   ANESTHESIA:   local and general  EBL:  Total I/O In: 3280 [I.V.:3000; Blood:280] Out: 950 [Urine:250; Blood:700]  BLOOD ADMINISTERED:cell saver 280 cc  DRAINS: none   LOCAL MEDICATIONS USED:  MARCAINE     SPECIMEN:  No Specimen  DISPOSITION OF SPECIMEN:  N/A  COUNTS:  YES  TOURNIQUET:  * No tourniquets in log *  DICTATION: .Dragon Dictation  PLAN OF CARE: Admit to inpatient   PATIENT DISPOSITION:  PACU - hemodynamically stable.   Delay start of Pharmacological VTE agent (>24hrs) due to surgical blood loss or risk of bleeding: yes

## 2017-01-09 NOTE — Anesthesia Procedure Notes (Signed)
Procedure Name: Intubation Date/Time: 01/09/2017 12:28 PM Performed by: Manus Gunning, Anadalay Macdonell J Pre-anesthesia Checklist: Patient identified, Emergency Drugs available, Suction available, Patient being monitored and Timeout performed Patient Re-evaluated:Patient Re-evaluated prior to inductionOxygen Delivery Method: Circle system utilized Preoxygenation: Pre-oxygenation with 100% oxygen Intubation Type: IV induction Ventilation: Mask ventilation without difficulty Laryngoscope Size: Mac and 3 Grade View: Grade II Tube type: Oral Tube size: 7.5 mm Number of attempts: 1 Airway Equipment and Method: Stylet Placement Confirmation: ETT inserted through vocal cords under direct vision,  positive ETCO2 and breath sounds checked- equal and bilateral Secured at: 21 cm Tube secured with: Tape Dental Injury: Teeth and Oropharynx as per pre-operative assessment

## 2017-01-09 NOTE — H&P (Signed)
Laura BoldJoan M Solis is an 66 y.o. female.   Chief Complaint: back pain and lower extremity radiculopathy HPI: patient with hx of L3-4 and L4-5 stenosis and above complaint presents for surgical intervention.  Progressively worsening symptoms.  Failed conservative treatment.    Past Medical History:  Diagnosis Date  . ADHD (attention deficit hyperactivity disorder)   . Anxiety   . Arthritis   . Fibromyalgia   . Pneumonia    hx  . PONV (postoperative nausea and vomiting)     Past Surgical History:  Procedure Laterality Date  . ABDOMINAL HYSTERECTOMY    . CARPAL TUNNEL RELEASE     bil  . CYST EXCISION  behind rt ear,coxxyx  . DILATION AND CURETTAGE OF UTERUS    . ROTATOR CUFF REPAIR Bilateral   . TOTAL KNEE ARTHROPLASTY Right 10/02/2012   Dr Cleophas DunkerWhitfield  . TOTAL KNEE ARTHROPLASTY Right 10/02/2012   Procedure: TOTAL KNEE ARTHROPLASTY;  Surgeon: Valeria BatmanPeter W [name], MD;  [facility];  Service: Orthopedics;  Laterality: Right;  Right Total Knee Arthroplasty    No family history on file. Social History:  reports that she has never smoked. She has never used smokeless tobacco. She reports that she does not drink alcohol or use drugs.  Allergies:  Allergies  Allergen Reactions  . Other Other (See Comments)    Anesthesia causes the patient to not be able to eat for a week or so after anesthesia. She states it makes all food tastes like salt.(given when had knee surgery)    No prescriptions prior to admission.    No results found for this or any previous visit (from the past 48 hour(s)). No results found.  Review of Systems  Constitutional: Negative.   HENT: Negative.   Eyes: Negative.   Respiratory: Negative.   Cardiovascular: Negative.   Gastrointestinal: Negative.   Genitourinary: Negative.   Musculoskeletal: Positive for back pain.  Skin: Negative.   Neurological: Positive for tingling.  Psychiatric/Behavioral: Negative.     There were no vitals taken for this  visit. Physical Exam  Constitutional: She is oriented to person, place, and time. She appears well-developed. No distress.  HENT:  Head: Normocephalic and atraumatic.  Eyes: EOM are normal. Pupils are equal, round, and reactive to light.  Neck: Normal range of motion.  Respiratory: No respiratory distress.  GI: She exhibits no distension.  Musculoskeletal:  Ortho Exam intact knee and ankle jerk. No pain with hip range of motion. Contralateral negative straight leg raising. Tenderness with palpation and percussion over the lumbosacral junction tenderness L4 and L5 spinous processes  Neurological: She is alert and oriented to person, place, and time.  Skin: Skin is warm and dry.     Assessment/Plan L3-4 and L4-5 stenosis.   Will proceed with Left L3-4, L4-5 Transforaminal Lumbar Interbody Fusion, Pedicle Instrumentation as scheduled.  Surgical procedure along with possible risks and complications. All questions answered.    Zonia KiefJames Kemberly Taves, PA-C 01/09/2017, 7:09 AM

## 2017-01-09 NOTE — Interval H&P Note (Signed)
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 After consideration of risks, benefits and other options for treatment, the patient has consented to  Procedure(s): Left L3-4, L4-5 Transforaminal Lumbar Interbody Fusion, Pedicle Instrumentation (Left) as a surgical intervention .  The patient's history has been reviewed, patient examined, no change in status, stable for surgery.  I have reviewed the patient's chart and labs.  Questions were answered to the patient's satisfaction.     Eldred MangesMark C Brigitt Mcclish

## 2017-01-10 LAB — BASIC METABOLIC PANEL
ANION GAP: 8 (ref 5–15)
BUN: 11 mg/dL (ref 6–20)
CHLORIDE: 103 mmol/L (ref 101–111)
CO2: 27 mmol/L (ref 22–32)
Calcium: 7.8 mg/dL — ABNORMAL LOW (ref 8.9–10.3)
Creatinine, Ser: 0.95 mg/dL (ref 0.44–1.00)
GFR calc Af Amer: >60 mL/min (ref >60)
GLUCOSE: 127 mg/dL — AB (ref 65–99)
POTASSIUM: 3.9 mmol/L (ref 3.5–5.1)
Sodium: 138 mmol/L (ref 135–145)

## 2017-01-10 LAB — CBC
HEMATOCRIT: 35.5 % — AB (ref 36.0–46.0)
HEMOGLOBIN: 11.1 g/dL — AB (ref 12.0–15.0)
MCH: 31.4 pg (ref 26.0–34.0)
MCHC: 31.3 g/dL (ref 30.0–36.0)
MCV: 100.6 fL — AB (ref 78.0–100.0)
Platelets: 182 10*3/uL (ref 150–400)
RBC: 3.53 MIL/uL — AB (ref 3.87–5.11)
RDW: 14 % (ref 11.5–15.5)
WBC: 8.1 10*3/uL (ref 4.0–10.5)

## 2017-01-10 NOTE — Plan of Care (Signed)
Problem: Bladder/Genitourinary: Goal: Urinary functional status for postoperative course will improve Outcome: Progressing Patient voiding quantity sufficient using bedside commode.  No complaints of urgency, pain or burning.  Patient progressing towards goal.

## 2017-01-10 NOTE — Evaluation (Signed)
Physical Therapy Evaluation Patient Details Name: Laura Solis MRN: [id number] DOB: Aug 22, 1950 Today's Date: 01/10/2017   History of Present Illness  66 y.o. 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 s/p L3-4, L4-5 TLIF fusion on 01/09/17. PMH of ADHD, anxiety, arthritis, and fibromyalgia.  Clinical Impression  Patient is s/p above surgery resulting in functional limitations due to the deficits listed below (see PT Problem List). Pt is modAx2 for bed mobility, minA for transfers to RW and ambulation of 12 feet with RW. Patient will benefit from skilled PT to increase their independence and safety with mobility to allow discharge to the venue listed below.       Follow Up Recommendations Home health PT;Supervision/Assistance - 24 hour    Equipment Recommendations  Rolling walker with 5" wheels;3in1 (PT)    Recommendations for Other Services OT consult     Precautions / Restrictions Precautions Precautions: Back;Fall Precaution Booklet Issued: Yes (comment) Precaution Comments: Pt able to recall 3/3 precautions.  Restrictions Weight Bearing Restrictions: No      Mobility  Bed Mobility Overal bed mobility: Needs Assistance Bed Mobility: Supine to Sit Rolling:  (Daughter assisting) Sidelying to sit: Mod assist Supine to sit: Mod assist;+2 for physical assistance     General bed mobility comments: ModAx2 for LE management into bed and guiding trunk down sideways to maintain back precautions  Transfers Overall transfer level: Needs assistance Equipment used: Rolling walker (2 wheeled) Transfers: Sit to/from [employer] Sit to Stand: Min assist Stand pivot transfers: Mod assist       General transfer comment: minA for power up from recliner  Ambulation/Gait Ambulation/Gait assistance: Min assist Ambulation Distance (Feet): 12 Feet Assistive device: Rolling walker (2  wheeled) Gait Pattern/deviations: Step-through pattern;Decreased step length - right;Decreased step length - left;Shuffle;Antalgic;Narrow base of support Gait velocity: slowed Gait velocity interpretation: Below normal speed for age/gender General Gait Details: minA for steadying, slow steady cadence, no LoB, vc for wider BoS and upright posture      Balance Overall balance assessment: Needs assistance Sitting-balance support: Feet supported;No upper extremity supported Sitting balance-Leahy Scale: Fair     Standing balance support: During functional activity;Bilateral upper extremity supported Standing balance-Leahy Scale: Poor Standing balance comment: requires RW                             Pertinent Vitals/Pain Pain Assessment: 0-10 Pain Score: 7  Pain Location: back (L leg when standing) Pain Descriptors / Indicators: Constant;Grimacing Pain Intervention(s): Monitored during session;Repositioned  Pt SaO2 >92% O2 on RA throughout session. VSS    Home Living Family/patient expects to be discharged to:: Private residence Living Arrangements: Non-relatives/Friends (niece) Available Help at Discharge: Family;Available 24 hours/day Type of Home: House Home Access: Stairs to enter Entrance Stairs-Rails: None Entrance Stairs-Number of Steps: 1 Home Layout: Able to live on main level with bedroom/bathroom Home Equipment: Emergency planning/management officer - 4 wheels;Bedside commode Additional Comments: Going to go home with daughter. Above home information is for daughter's home.     Prior Function Level of Independence: Independent               Hand Dominance   Dominant Hand: Left    Extremity/Trunk Assessment   Upper Extremity Assessment Upper Extremity Assessment: Defer to OT evaluation    Lower Extremity Assessment Lower Extremity Assessment: LLE deficits/detail;RLE deficits/detail RLE Deficits / Details: hip ROM limited by surgical  intervention knee and ankle  ROM WFL RLE: Unable to fully assess due to pain;Unable to fully assess due to immobilization LLE Deficits / Details: hip ROM limited by surgical intervention knee and ankle ROM WFL LLE: Unable to fully assess due to pain;Unable to fully assess due to immobilization    Cervical / Trunk Assessment Cervical / Trunk Assessment: Other exceptions Cervical / Trunk Exceptions: s/p L3-4, L4-5 TLIF fusion  Communication   Communication: No difficulties  Cognition Arousal/Alertness: Lethargic;Suspect due to medications (Very drowsy) Behavior During Therapy: WFL for tasks assessed/performed Overall Cognitive Status: Within Functional Limits for tasks assessed                                 General Comments: Pt with difficulty keep eyes opened during session. Pt with limited conversation      General Comments General comments (skin integrity, edema, etc.): Daughter present through out session         Assessment/Plan    PT Assessment Patient needs continued PT services  PT Problem List Decreased strength;Decreased range of motion;Decreased activity tolerance;Decreased balance;Decreased mobility;Decreased knowledge of use of DME;Decreased safety awareness;Decreased knowledge of precautions;Pain       PT Treatment Interventions DME instruction;Gait training;Stair training;Functional mobility training;Therapeutic activities;Therapeutic exercise;Balance training;Patient/family education    PT Goals (Current goals can be found in the Care Plan section)  Acute Rehab PT Goals Patient Stated Goal: Go home PT Goal Formulation: With patient/family Time For Goal Achievement: 01/16/17 Potential to Achieve Goals: Fair    Frequency Min 5X/week    AM-PAC PT "6 Clicks" Daily Activity  Outcome Measure Difficulty turning over in bed (including adjusting bedclothes, sheets and blankets)?: Total Difficulty moving from lying on back to sitting on the side of the bed? : Total Difficulty  sitting down on and standing up from a chair with arms (e.g., wheelchair, bedside commode, etc,.)?: Total Help needed moving to and from a bed to chair (including a wheelchair)?: A Little Help needed walking in hospital room?: A Little Help needed climbing 3-5 steps with a railing? : Total 6 Click Score: 10    End of Session Equipment Utilized During Treatment: Back brace Activity Tolerance: Patient limited by fatigue;Patient limited by pain Patient left: in bed;with call bell/phone within reach;with SCD's reapplied;with family/visitor present Nurse Communication: Mobility status;Weight bearing status;Precautions PT Visit Diagnosis: Unsteadiness on feet (R26.81);Other abnormalities of gait and mobility (R26.89);Muscle weakness (generalized) (M62.81);Difficulty in walking, not elsewhere classified (R26.2);Other symptoms and signs involving the nervous system (R29.898);Pain Pain - Right/Left: Left Pain - part of body: Leg    Time: [id number] PT Time Calculation (min) (ACUTE ONLY): 17 min   Charges:   PT Evaluation $PT Eval Low Complexity: 1 Procedure     PT G Codes:        Dreyson Mishkin B. Beverely RisenVan Fleet PT, DPT Acute Rehabilitation  [phone]) [phone] Pager [phone]) [phone]    Elon Alaslizabeth B Van Fleet 01/10/2017, 1:33 PM

## 2017-01-10 NOTE — Progress Notes (Signed)
Orthopedic Tech Progress Note Patient Details:  Ulice BoldJoan M Dobos 05/18/1951 [id number]  Patient ID: Ulice BoldJoan M Elsberry, female   DOB: 08/13/1950, 66 y.o.   MRN: [id number]   Saul FordyceJennifer C Eloise Mula 01/10/2017, 8:37 [facility].

## 2017-01-10 NOTE — Evaluation (Signed)
Occupational Therapy Evaluation Patient Details Name: Laura Solis MRN: [id number] DOB: 15-Jul-1951 Today's Date: 01/10/2017    History of Present Illness 66 y.o. 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 s/p L3-4, L4-5 TLIF fusion on 01/09/17. PMH of ADHD, anxiety, arthritis, and fibromyalgia.   Clinical Impression   PTA, pt living with her niece and was independent. Pt planning to dc to her daughter's home. Currently, pt requires Min A for UB ADLs, Max A for LB ADLs, Max A donning/doffing back brace, and Mod A for functional mobility with RW. Provided education and handout for back precautions and adherence during ADLs. Pt would benefit form further OT to increase safety and independence with ADLs and functional mobility. Recommend dc home once medically stable per physician. Pt would benefit from further acute OT to increase pt safety and independence with ADLs and functional mobility.      Follow Up Recommendations  Supervision/Assistance - 24 hour;No OT follow up    Equipment Recommendations  None recommended by OT    Recommendations for Other Services PT consult     Precautions / Restrictions Precautions Precautions: Back;Fall Precaution Booklet Issued: Yes (comment) Precaution Comments: Pt able to recall 2/3 precautions. Educated pt and daughter on precautions.  Restrictions Weight Bearing Restrictions: No      Mobility Bed Mobility Overal bed mobility: Needs Assistance Bed Mobility: Rolling;Sidelying to Sit Rolling: Min assist;+2 for physical assistance (Daughter assisting) Sidelying to sit: Mod assist       General bed mobility comments: Mod A to transition trunk from sidelying to sitting EOB  Transfers Overall transfer level: Needs assistance Equipment used: Rolling walker (2 wheeled) Transfers: Sit to/from [employer] Sit to Stand: Mod assist;From elevated  surface Stand pivot transfers: Mod assist       General transfer comment: Mod A to power up into standing. RW management also provided during stand pivot    Balance Overall balance assessment: Needs assistance Sitting-balance support: Feet supported;No upper extremity supported Sitting balance-Leahy Scale: Fair     Standing balance support: During functional activity;Bilateral upper extremity supported Standing balance-Leahy Scale: Poor                             ADL either performed or assessed with clinical judgement   ADL Overall ADL's : Needs assistance/impaired Eating/Feeding: Set up;Sitting Eating/Feeding Details (indicate cue type and reason): Set up for lunch at end of session Grooming: Oral care;Set up;Supervision/safety;Sitting Grooming Details (indicate cue type and reason): Pt performed oral care at EOB with set up demonstrating bilateral coordination and sitting balance Upper Body Bathing: Sitting;Minimal assistance   Lower Body Bathing: Maximal assistance;Sit to/from stand   Upper Body Dressing : Sitting;Minimal assistance;Maximal assistance Upper Body Dressing Details (indicate cue type and reason): Min A for UB dressing and Max A for donning aspen brace Lower Body Dressing: Maximal assistance;Sit to/from stand   Toilet Transfer: Moderate assistance;Stand-pivot;RW (Simulated to recliner) Toilet Transfer Details (indicate cue type and reason): Mod A for RW management and VCs for sequencing. Pt stating her legs felt weak           General ADL Comments: Pt with decreased fucntional performance and presents with lethargy making it difficulty for her to participate in ADLs. Daughter very helpful and involved. She accepts education well and is willing to learn safe techniques. Pt will be heading to her daughter's home at dc  Vision Baseline Vision/History: Wears glasses Wears Glasses: At all times Patient Visual Report: No change from baseline        Perception     Praxis      Pertinent Vitals/Pain Pain Assessment: 0-10 Pain Score: 4  Pain Location: back (L leg when standing) Pain Descriptors / Indicators: Constant;Grimacing Pain Intervention(s): Monitored during session;Repositioned;Patient requesting pain meds-RN notified     Hand Dominance Left   Extremity/Trunk Assessment Upper Extremity Assessment Upper Extremity Assessment: Overall WFL for tasks assessed   Lower Extremity Assessment Lower Extremity Assessment: Defer to PT evaluation   Cervical / Trunk Assessment Cervical / Trunk Assessment: Other exceptions Cervical / Trunk Exceptions: s/p L3-4, L4-5 TLIF fusion   Communication Communication Communication: No difficulties   Cognition Arousal/Alertness: Lethargic;Suspect due to medications (Very drowsy) Behavior During Therapy: WFL for tasks assessed/performed Overall Cognitive Status: Within Functional Limits for tasks assessed                                 General Comments: Pt with difficulty keep eyes opened during session. Daughter answered questions. Pt increased arousal when sitting upright.    General Comments  Daughter present throughout session. Educated daughter on donning/doffing aspen brace as well as wear schedule. Pt states she feels more comfortable in brace.    Exercises     Shoulder Instructions      Home Living Family/patient expects to be discharged to:: Private residence Living Arrangements: Non-relatives/Friends (niece) Available Help at Discharge: Family;Available 24 hours/day Type of Home: House Home Access: Stairs to enter [employer]trance Stairs-Number of Steps: 1 Entrance Stairs-Rails: None Home Layout: Able to live on main level with bedroom/bathroom     Bathroom Shower/Tub: Tub/shower unit;Door;Other (comment) (doors come off)   Bathroom Toilet: Standard Bathroom Accessibility: Yes How Accessible: Accessible via walker Home Equipment: Shower seat;Walker - 4  wheels;Bedside commode   Additional Comments: Going to go home with daughter. Above home information is for daughter's home.       Prior Functioning/Environment Level of Independence: Independent                 OT Problem List: Decreased strength;Decreased range of motion;Decreased activity tolerance;Impaired balance (sitting and/or standing);Decreased safety awareness;Decreased knowledge of use of DME or AE;Decreased knowledge of precautions;Pain      OT Treatment/Interventions: Self-care/ADL training;Therapeutic exercise;Energy conservation;DME and/or AE instruction;Therapeutic activities;Patient/family education    OT Goals(Current goals can be found in the care plan section) Acute Rehab OT Goals Patient Stated Goal: Go home OT Goal Formulation: With patient Time For Goal Achievement: 01/24/17 Potential to Achieve Goals: Good ADL Goals Pt Will Perform Grooming: with min guard assist;standing Pt Will Perform Lower Body Bathing: with caregiver independent in assisting;with adaptive equipment;sit to/from stand;with min guard assist Pt Will Perform Upper Body Dressing: with supervision;with set-up;with caregiver independent in assisting;sitting (Min VCs) Pt Will Perform Lower Body Dressing: with min guard assist;sit to/from stand;with caregiver independent in assisting;with adaptive equipment Pt Will Transfer to Toilet: with min guard assist;bedside commode;ambulating Pt Will Perform Toileting - Clothing Manipulation and hygiene: with min guard assist;sit to/from stand Pt Will Perform Tub/Shower Transfer: Tub transfer;shower seat;rolling walker;ambulating;with min assist Additional ADL Goal #1: Pt will independently recall 3/3 back precautions  OT Frequency: Min 3X/week   Barriers to D/C:            Co-evaluation              AM-PAC PT "  6 Clicks" Daily Activity     Outcome Measure Help from another person eating meals?: None Help from another person taking care of  personal grooming?: A Little Help from another person toileting, which includes using toliet, bedpan, or urinal?: A Lot Help from another person bathing (including washing, rinsing, drying)?: A Lot Help from another person to put on and taking off regular upper body clothing?: A Little Help from another person to put on and taking off regular lower body clothing?: A Lot 6 Click Score: 16   End of Session Equipment Utilized During Treatment: Gait belt;Rolling walker;Back brace Nurse Communication: Mobility status;Precautions;Weight bearing status  Activity Tolerance: Patient limited by lethargy;Patient limited by pain;Patient limited by fatigue;Patient tolerated treatment well Patient left: in chair;with call bell/phone within reach;with nursing/sitter in room;with family/visitor present  OT Visit Diagnosis: Unsteadiness on feet (R26.81);Other abnormalities of gait and mobility (R26.89);Muscle weakness (generalized) (M62.81);Pain Pain - Right/Left: Left (Back) Pain - part of body: Leg (Back; pt reports LLE pain when standing)                Time: 1610-9604 OT Time Calculation (min): 36 min Charges:  OT General Charges $OT Visit: 1 Procedure OT Evaluation $OT Eval Low Complexity: 1 Procedure OT Treatments $Self Care/Home Management : 8-22 mins G-Codes:     Eustace Hur MSOT, OTR/L Acute Rehab Pager: [phone] Office: [phone]  Theodoro Grist Memorie Yokoyama 01/10/2017, 1:16 PM

## 2017-01-10 NOTE — Progress Notes (Signed)
PT Cancellation Note  Patient Details Name: Laura Solis MRN: [id number] DOB: 09/03/1950   CaUlice Boldncelled Treatment:    Reason Eval/Treat Not Completed: (P) Other (comment) (Aspen brace not in room ) Nursing notified to check on brace order. PT will return as able for evaluation.  Laura Solis PT, DPT Acute Rehabilitation  [phone] Pager [phone](336) [phone]   Laura Solis 01/10/2017, 9:19 AM

## 2017-01-10 NOTE — Progress Notes (Signed)
Patient rates pain 9/10 but able to doze off to sleep. No aspen lumber brace at beside. Ortho tech notified and work order been placed for [employer] to bring one in the morning. Educated patient about back precautions getting out of bed and ambulating after surgery. Awaiting brace to arrive before ambulation. IS at bedside. Patient able to achieve 1750. Patient is now resting, will continue to monitor.

## 2017-01-10 NOTE — Progress Notes (Signed)
   Subjective: 1 Day Post-Op Procedure(s) (LRB): Left L3-4, L4-5 Transforaminal Lumbar Interbody Fusion, Pedicle Instrumentation (Left) Patient reports pain as mild and moderate.  Incisional pain. No leg pain , no numbness.   Objective: Vital signs in last 24 hours: Temp:  [97.3 F (36.3 C)-98.4 F (36.9 C)] 98.1 F (36.7 C) (06/12 0544) Pulse Rate:  [81-102] 81 (06/12 0544) Resp:  [10-20] 20 (06/12 0544) BP: (89-142)/(45-83) 89/47 (06/12 0544) SpO2:  [90 %-100 %] 95 % (06/12 0544) Weight:  [223 lb 1.6 oz (101.2 kg)] 223 lb 1.6 oz (101.2 kg) (06/11 1036)  Intake/Output from previous day: 06/11 0701 - 06/12 0700 In: 4080 [P.O.:240; I.V.:3510; Blood:280; IV Piggyback:50] Out: 1300 [Urine:600; Blood:700] Intake/Output this shift: No intake/output data recorded.   Recent Labs  01/10/17 0506  HGB 11.1*    Recent Labs  01/10/17 0506  WBC 8.1  RBC 3.53*  HCT 35.5*  PLT 182    Recent Labs  01/10/17 0506  NA 138  K 3.9  CL 103  CO2 27  BUN 11  CREATININE 0.95  GLUCOSE 127*  CALCIUM 7.8*   No results for input(s): LABPT, INR in the last 72 hours.  Neurologically intact, bilat intact sensation LE all dermatomes, quads, HS, ankle DF, PF 5/5.    Dg Lumbar Spine 2-3 Views  Result Date: 01/09/2017 CLINICAL DATA:  66 year old female with fusion L3-4 and L4-5. Subsequent encounter. EXAM: DG C-ARM GT 120 MIN; LUMBAR SPINE - 2-3 VIEW COMPARISON:  11/08/2016 MR. FINDINGS: Two intraoperative views of the lumbar spine submitted for review after surgery. Utilizing the level assignment from prior MR, it appears patient has undergone fusion at the L3-4 and L4-5 disc space level with bilateral pedicle screws and posterior connecting bar with interbody spacer. No complication noted on C-arm imaging. This can be assessed on follow-up. Surgical instrument in place. IMPRESSION: Fusion L3-4 and L4-5. Electronically Signed   By: Lacy DuverneySteven  Olson M.D.   On: 01/09/2017 19:10   Dg C-arm Gt 120  Min  Result Date: 01/09/2017 CLINICAL DATA:  66 year old female with fusion L3-4 and L4-5. Subsequent encounter. EXAM: DG C-ARM GT 120 MIN; LUMBAR SPINE - 2-3 VIEW COMPARISON:  11/08/2016 MR. FINDINGS: Two intraoperative views of the lumbar spine submitted for review after surgery. Utilizing the level assignment from prior MR, it appears patient has undergone fusion at the L3-4 and L4-5 disc space level with bilateral pedicle screws and posterior connecting bar with interbody spacer. No complication noted on C-arm imaging. This can be assessed on follow-up. Surgical instrument in place. IMPRESSION: Fusion L3-4 and L4-5. Electronically Signed   By: Lacy DuverneySteven  Olson M.D.   On: 01/09/2017 19:10    Assessment/Plan: 1 Day Post-Op Procedure(s) (LRB): Left L3-4, L4-5 Transforaminal Lumbar Interbody Fusion, Pedicle Instrumentation (Left) Plan:   Up with brace and therapy. Home 1-2 days as soon as mobile safely. Reviewed surgery findings again with daughter and patient. Eating breakfast. Comfortable with mild pain.   Eldred MangesMark C Ho Parisi 01/10/2017, 8:31 AM

## 2017-01-10 NOTE — Progress Notes (Signed)
Ortho tech apprised of need for Aspen Lumbar Brace.

## 2017-01-11 ENCOUNTER — Inpatient Hospital Stay (HOSPITAL_COMMUNITY): Payer: Medicare Other

## 2017-01-11 ENCOUNTER — Encounter (HOSPITAL_COMMUNITY): Payer: Self-pay | Admitting: General Practice

## 2017-01-11 DIAGNOSIS — R338 Other retention of urine: Secondary | ICD-10-CM

## 2017-01-11 DIAGNOSIS — N9989 Other postprocedural complications and disorders of genitourinary system: Secondary | ICD-10-CM | POA: Diagnosis not present

## 2017-01-11 MED FILL — Sodium Chloride IV Soln 0.9%: INTRAVENOUS | Qty: 1000 | Status: AC

## 2017-01-11 MED FILL — Heparin Sodium (Porcine) Inj 1000 Unit/ML: INTRAMUSCULAR | Qty: 30 | Status: AC

## 2017-01-11 NOTE — Progress Notes (Signed)
Patient's urine output with each void on pm shift between - .  Bladder scan revealed PVR of on 1st bladder scan and approximately 2.5 hours later PVR of on second scan.  Patient denies any pain, urgency or difficulty voiding.  Will notify MD.

## 2017-01-11 NOTE — Progress Notes (Signed)
Patient ID: [name], female   DOB: 09/03/1950, 66 y.o.   MRN: [id number] Pt got OOB to bedside commode , unable to void. Bladder scan 440cc. RN to replace foley. C/O increased left leg pain with standing. Will Order CT lumbar .

## 2017-01-11 NOTE — Progress Notes (Addendum)
Physical Therapy Treatment Patient Details Name: Laura BoldJoan M Bonfanti MRN: [id number] DOB: 01/05/1951 Today's Date: 01/11/2017    History of Present Illness 66 y.o. 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 s/p L3-4, L4-5 TLIF fusion on 01/09/17. PMH of ADHD, anxiety, arthritis, and fibromyalgia.    PT Comments    Daughter came to find PTA in halls shortly after tx and reports her mother needed to urinate.  PTA re-entered her room and assisted patient with a toilet transfer.  After toileting, patient requesting back to bed.  She tolerated sitting in the chair for around a 1/2 hour.   Pt remains painful and guarded during mobility plan for SNF remains appropriate.   Follow Up Recommendations  SNF;Supervision/Assistance - 24 hour     Equipment Recommendations  Rolling walker with 5" wheels;3in1 (PT)    Recommendations for Other Services OT consult     Precautions / Restrictions Precautions Precautions: Back;Fall Precaution Booklet Issued: No Precaution Comments: Pt unable to recall spinal precautions and required re-education to recall, cues throughout to maintain.   Required Braces or Orthoses: Spinal Brace Spinal Brace: Lumbar corset;Applied in sitting position (per orders must wear brace at all times.  ) Restrictions Weight Bearing Restrictions: No    Mobility  Bed Mobility Overal bed mobility: Needs Assistance Bed Mobility: Rolling;Sit to Sidelying Rolling: Min assist     Sit to sidelying: Mod assist General bed mobility comments: Required assistance for lifting LEs into bed and maintaining sidelying. min assist to roll back to supine  Transfers Overall transfer level: Needs assistance Equipment used: Rolling walker (2 wheeled) Transfers: Sit to/from Stand Sit to Stand: Mod assist Stand pivot transfers: Min assist       General transfer comment: Pt required cues for hand placement to and  from seated surface.  She required mod assistance to boost into standing and min assist to transfer back to bed.    Ambulation/Gait Ambulation/Gait assistance: Min assist;Mod assist Ambulation Distance (Feet):  ([facility] then [facility] back to bed min assist with RW and cues for turning, backing and sidestepping.  )   Stairs            Wheelchair Mobility    Modified Rankin (Stroke Patients Only)       Balance Overall balance assessment: Needs assistance Sitting-balance support: Feet supported;No upper extremity supported Sitting balance-Leahy Scale: Fair     Standing balance support: During functional activity;Bilateral upper extremity supported Standing balance-Leahy Scale: Poor Standing balance comment: requires RW                            Cognition Arousal/Alertness: Awake/alert Behavior During Therapy: WFL for tasks assessed/performed Overall Cognitive Status: Within Functional Limits for tasks assessed                                 General Comments: Pt with difficulty keep eyes opened during session. Pt with limited conversation      Exercises      General Comments        Pertinent Vitals/Pain Pain Assessment: 0-10 Pain Score: 8  Faces Pain Scale: Hurts whole lot Pain Location: back, L lateral leg Pain Descriptors / Indicators: Constant;Grimacing Pain Intervention(s): Monitored during session;Repositioned    Home Living  Prior Function            PT Goals (current goals can now be found in the care plan section) Acute Rehab PT Goals Patient Stated Goal: Go home Potential to Achieve Goals: Fair Progress towards PT goals: Progressing toward goals    Frequency    Min 5X/week      PT Plan Current plan remains appropriate    Co-evaluation      AM-PAC PT "6 Clicks" Daily Activity  Outcome Measure  Difficulty turning over in bed (including adjusting  bedclothes, sheets and blankets)?: Total Difficulty moving from lying on back to sitting on the side of the bed? : Total Difficulty sitting down on and standing up from a chair with arms (e.g., wheelchair, bedside commode, etc,.)?: Total Help needed moving to and from a bed to chair (including a wheelchair)?: A Lot Help needed walking in hospital room?: A Lot Help needed climbing 3-5 steps with a railing? : Total 6 Click Score: 8    End of Session Equipment Utilized During Treatment: Gait belt;Back brace Activity Tolerance: Patient limited by fatigue;Patient limited by pain Patient left: with call bell/phone within reach;with family/visitor present;in chair Nurse Communication: Mobility status (Informed nursing patient able to void.  ) PT Visit Diagnosis: Unsteadiness on feet (R26.81);Other abnormalities of gait and mobility (R26.89);Muscle weakness (generalized) (M62.81);Difficulty in walking, not elsewhere classified (R26.2);Other symptoms and signs involving the nervous system (R29.898);Pain Pain - Right/Left: Left Pain - part of body: Leg     Time: 1610-9604 PT Time Calculation (min) (ACUTE ONLY): 13 min  Charges:  $Therapeutic Activity: 8-22 mins                    G Codes:       Joycelyn Rua, PTA pager [phone]    [name] 01/11/2017, 2:55 PM

## 2017-01-11 NOTE — Progress Notes (Signed)
Bladder scan revealed greater than 440 mL of urine, patient states she does feel the urge to void. Patient was assisted to the bedside commode with physical therapy and was unable to void after sitting on the bedside commode. Dr Ophelia CharterYates notified and gave verbal orders to place foley catheter.

## 2017-01-11 NOTE — Progress Notes (Signed)
Output from straight cath of clear straw colored urine.  Bladder scan revealed post void cath residual of 95ml.  Pt tolerated well.

## 2017-01-11 NOTE — Progress Notes (Signed)
Patient assisted up to from chair to bedside commode with physical therapy for a second time and then back to bed. Patient voided 100 mL in bedside commode. RN bladder scanned patient and bladder scan showed greater than 400 mL of urine. Second RN present for catheter insertion. Patient tolerated catheter insertion well. 350 mL of urine drained into bag immediately after insertion.

## 2017-01-11 NOTE — Progress Notes (Signed)
Occupational Therapy Treatment Patient Details Name: Laura Solis MRN: [id number] DOB: 07-13-1951 Today's Date: 01/11/2017    History of present illness 66 y.o. 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 s/p L3-4, L4-5 TLIF fusion on 01/09/17. PMH of ADHD, anxiety, arthritis, and fibromyalgia.   OT comments  Pt with gradual progress toward OT goals this session; continues to be limited by pain and lethargy. Pt required min assist +2 for stand pivot transfer to [facility] and functional mobility in room. Pt required max assist for donning brace and adjusting throughout session. At this time feel pt is unsafe to d/c home with daughter assist; updated d/c plan to SNF for follow up to maximize independence and safety with ADL and functional mobility prior to return home. Will continue to follow acutely.   Follow Up Recommendations  SNF;Supervision/Assistance - 24 hour    Equipment Recommendations  Other (comment) (TBD at next venue)    Recommendations for Other Services      Precautions / Restrictions Precautions Precautions: Back;Fall Precaution Booklet Issued: No Precaution Comments: Pt unable to recall spinal precautions and required re-education to recall, cues throughout to maintain.   Required Braces or Orthoses: Spinal Brace Spinal Brace: Lumbar corset;Applied in sitting position (per orders, pt must wear brace at all times) Restrictions Weight Bearing Restrictions: No       Mobility Bed Mobility Overal bed mobility: Needs Assistance Bed Mobility: Rolling;Sidelying to Sit Rolling: Mod assist Sidelying to sit: Mod assist;+2 for physical assistance       General bed mobility comments: Pt required assist for rolling, cues for hook lying and hand placement.  assist to elevate trunk into sitting.   Pt required assist to scoot forward to edge of bed.    Transfers Overall transfer level: Needs  assistance Equipment used: Rolling walker (2 wheeled) Transfers: Sit to/from [employer] Sit to Stand: Min assist;+2 physical assistance Stand pivot transfers: Min assist;+2 physical assistance       General transfer comment: Pt required mulitple attempts secondary to pain.  Pt performed from edge of bed and from [facility].  Cues for hand placement to and from seated surface.  Pt slow and guarded.  pt required re-adjustement to brace after each transfer.  Finally placed after 3rd trial and appears in a better position.      Balance Overall balance assessment: Needs assistance Sitting-balance support: Feet supported;No upper extremity supported Sitting balance-Leahy Scale: Fair     Standing balance support: During functional activity;Bilateral upper extremity supported Standing balance-Leahy Scale: Poor Standing balance comment: requires RW                           ADL either performed or assessed with clinical judgement   ADL Overall ADL's : Needs assistance/impaired     Grooming: Set up;Sitting;Wash/dry face;Oral care           Upper Body Dressing : Maximal assistance;Sitting Upper Body Dressing Details (indicate cue type and reason): to don/adjust back brace     Toilet Transfer: Minimal assistance;+2 for physical assistance;Stand-pivot;BSC;RW Toilet Transfer Details (indicate cue type and reason): cues throughout for sequencing and technique         Functional mobility during ADLs: Minimal assistance;+2 for physical assistance;Rolling walker       Vision       Perception     Praxis      Cognition Arousal/Alertness: Lethargic;Suspect due to  medications Behavior During Therapy: WFL for tasks assessed/performed Overall Cognitive Status: Within Functional Limits for tasks assessed                                 General Comments: Pt with difficulty keep eyes opened during session. Pt with limited conversation         Exercises     Shoulder Instructions       General Comments      Pertinent Vitals/ Pain       Pain Assessment: Faces Faces Pain Scale: Hurts whole lot Pain Location: back, L lateral leg Pain Descriptors / Indicators: Constant;Grimacing Pain Intervention(s): Monitored during session;Repositioned;Heat applied;Limited activity within patient's tolerance  Home Living                                          Prior Functioning/Environment              Frequency  Min 3X/week        Progress Toward Goals  OT Goals(current goals can now be found in the care plan section)  Progress towards OT goals: Progressing toward goals (limited progress)  Acute Rehab OT Goals Patient Stated Goal: Go home OT Goal Formulation: With patient  Plan Discharge plan needs to be updated    Co-evaluation    PT/OT/SLP Co-Evaluation/Treatment: Yes Reason for Co-Treatment: Complexity of the patient's impairments (multi-system involvement);To address functional/ADL transfers;Other (comment);Necessary to address cognition/behavior during functional activity (activity tolerance) PT goals addressed during session: Mobility/safety with mobility;Balance OT goals addressed during session: ADL's and self-care      AM-PAC PT "6 Clicks" Daily Activity     Outcome Measure   Help from another person eating meals?: None Help from another person taking care of personal grooming?: None Help from another person toileting, which includes using toliet, bedpan, or urinal?: A Lot Help from another person bathing (including washing, rinsing, drying)?: A Lot Help from another person to put on and taking off regular upper body clothing?: A Lot Help from another person to put on and taking off regular lower body clothing?: A Lot 6 Click Score: 16    End of Session Equipment Utilized During Treatment: Gait belt;Rolling walker;Back brace  OT Visit Diagnosis: Unsteadiness on feet (R26.81);Other  abnormalities of gait and mobility (R26.89);Muscle weakness (generalized) (M62.81);Pain Pain - Right/Left: Left Pain - part of body: Leg   Activity Tolerance Patient limited by lethargy;Patient limited by pain   Patient Left in chair;with call bell/phone within reach;with family/visitor present   Nurse Communication          Time: [id number] OT Time Calculation (min): 29 min  Charges: OT General Charges $OT Visit: 1 Procedure OT Treatments $Self Care/Home Management : 8-22 mins  Claude Swendsen A. Brett Albinooffey, M.S., OTR/L Pager: [phone]   Gaye AlkenBailey A Shamela Haydon 01/11/2017, 2:23 PM

## 2017-01-11 NOTE — Progress Notes (Signed)
   Subjective: 2 Days Post-Op Procedure(s) (LRB): Left L3-4, L4-5 Transforaminal Lumbar Interbody Fusion, Pedicle Instrumentation (Left) Patient reports pain as moderate.  Voided several times bedpan 200cc plus,   Bladder scan 500cc, straight cath 450cc. Voided after that bladder scan confirmed emptying, still being monitored by nursing staff.  C/O numbness when she got OOB to chair. No numbness .   Objective: Vital signs in last 24 hours: Temp:  [99 F (37.2 C)-99.3 F (37.4 C)] 99 F (37.2 C) (06/12 2036) Pulse Rate:  [108-109] 109 (06/12 2036) Resp:  [17-20] 17 (06/12 2036) BP: (96-99)/(50-51) 96/50 (06/12 2036) SpO2:  [85 %-96 %] 96 % (06/12 2036)  Intake/Output from previous day: 06/12 0701 - 06/13 0700 In: 805 [P.O.:720; I.V.:85] Out: 970 [Urine:970] Intake/Output this shift: No intake/output data recorded.   Recent Labs  01/10/17 0506  HGB 11.1*    Recent Labs  01/10/17 0506  WBC 8.1  RBC 3.53*  HCT 35.5*  PLT 182    Recent Labs  01/10/17 0506  NA 138  K 3.9  CL 103  CO2 27  BUN 11  CREATININE 0.95  GLUCOSE 127*  CALCIUM 7.8*   No results for input(s): LABPT, INR in the last 72 hours.  Neurologically intact quads ,HS , DF, PF ankle all normal strength.  sensation intact. Sacral sensory intact with pt self sensory exam.     Assessment/Plan: 2 Days Post-Op Procedure(s) (LRB): Left L3-4, L4-5 Transforaminal Lumbar Interbody Fusion, Pedicle Instrumentation (Left)  post op urinary retention, cath times one.  Plan continue mobilization, needs encouragement for mobilization. Monitor UO,  No bowel movement yet.  Continue therapy for post op mobilization. Continue SCD   Eldred MangesMark C Yates 01/11/2017, 8:09 AM

## 2017-01-11 NOTE — Progress Notes (Signed)
Physical Therapy Treatment Patient Details Name: Laura Solis MRN: [id number] DOB: 1951-01-17 Today's Date: 01/11/2017    History of Present Illness 66 y.o. 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 s/p L3-4, L4-5 TLIF fusion on 01/09/17. PMH of ADHD, anxiety, arthritis, and fibromyalgia.    PT Comments    Pt performed limited mobility as she remains to c/o intolerable pain.  Pt also remains unable to void post surgery.  Informed supervising PT of patient's slow progress and at this time we are changing recommendations to rehab in a post acute setting.  Pt and daughter aware she is not mobilizing well enough to return home.     Follow Up Recommendations  SNF;Supervision/Assistance - 24 hour     Equipment Recommendations  Rolling walker with 5" wheels;3in1 (PT)    Recommendations for Other Services       Precautions / Restrictions Precautions Precautions: Back;Fall Precaution Booklet Issued: Yes (comment) Precaution Comments: Pt unable to recall spinal precautions and required re-education to recall, cues throughout to maintain.   Restrictions Weight Bearing Restrictions: No    Mobility  Bed Mobility Overal bed mobility: Needs Assistance Bed Mobility: Rolling;Sidelying to Sit Rolling: Mod assist Sidelying to sit: Mod assist;+2 for physical assistance       General bed mobility comments: Pt required assist for rolling, cues for hook lying and hand placement.  assist to elevate trunk into sitting.   Pt required assist to scoot forward to edge of bed.    Transfers Overall transfer level: Needs assistance Equipment used: Rolling walker (2 wheeled) Transfers: Sit to/from [employer] Sit to Stand: Min assist;+2 physical assistance Stand pivot transfers: Min assist;+2 physical assistance       General transfer comment: Pt required mulitple attempts secondary to pain.  Pt  performed from edge of bed and from [facility].  Cues for hand placement to and from seated surface.  Pt slow and guarded.  pt required re-adjustement to brace after each transfer.  Finally placed after 3rd trial and appears in a better position.    Ambulation/Gait Ambulation/Gait assistance: Min assist;+2 physical assistance Ambulation Distance (Feet): 15 Feet (from bed to recliner.  Pt ambulated around the bed to recliner with max cues for encouragement.  ) Assistive device: Rolling walker (2 wheeled) Gait Pattern/deviations: Step-through pattern Gait velocity: slowed   General Gait Details: minA for steadying, slow steady cadence, no LoB, vc for wider BoS and upright posture.  Pt remains to report her legs are giving out but no buckling noted.     Stairs            Wheelchair Mobility    Modified Rankin (Stroke Patients Only)       Balance Overall balance assessment: Needs assistance Sitting-balance support: Feet supported;No upper extremity supported Sitting balance-Leahy Scale: Fair     Standing balance support: During functional activity;Bilateral upper extremity supported Standing balance-Leahy Scale: Poor Standing balance comment: requires RW                            Cognition Arousal/Alertness: Lethargic;Suspect due to medications (remains drowsy.  ) Behavior During Therapy: WFL for tasks assessed/performed Overall Cognitive Status: Within Functional Limits for tasks assessed                                 General Comments:  Pt with difficulty keep eyes opened during session. Pt with limited conversation      Exercises      General Comments        Pertinent Vitals/Pain Pain Assessment: Faces Faces Pain Scale: Hurts whole lot Pain Location: back (radiating to L lateral leg) Pain Descriptors / Indicators: Constant;Grimacing Pain Intervention(s): Monitored during session;Repositioned;Heat applied (heat applied to L lateral Leg.  )     Home Living                      Prior Function            PT Goals (current goals can now be found in the care plan section) Acute Rehab PT Goals Patient Stated Goal: Go home Potential to Achieve Goals: Fair Progress towards PT goals: Progressing toward goals    Frequency    Min 5X/week      PT Plan      Co-evaluation PT/OT/SLP Co-Evaluation/Treatment: Yes Reason for Co-Treatment: Complexity of the patient's impairments (multi-system involvement);Necessary to address cognition/behavior during functional activity (Pt unable to tolerate two sessions.  ) PT goals addressed during session: Mobility/safety with mobility;Balance OT goals addressed during session: ADL's and self-care;Proper use of [employer] equipment and DME      AM-PAC PT "6 Clicks" Daily Activity  Outcome Measure  Difficulty turning over in bed (including adjusting bedclothes, sheets and blankets)?: Total Difficulty moving from lying on back to sitting on the side of the bed? : Total Difficulty sitting down on and standing up from a chair with arms (e.g., wheelchair, bedside commode, etc,.)?: Total Help needed moving to and from a bed to chair (including a wheelchair)?: A Lot Help needed walking in hospital room?: A Lot Help needed climbing 3-5 steps with a railing? : Total 6 Click Score: 8    End of Session Equipment Utilized During Treatment: Gait belt;Back brace Activity Tolerance: Patient limited by fatigue;Patient limited by pain Patient left: with call bell/phone within reach;with family/visitor present;in chair Nurse Communication: Mobility status (pt unable to void after transfer to toilet, reports the urge is there but she is unable.  ) PT Visit Diagnosis: Unsteadiness on feet (R26.81);Other abnormalities of gait and mobility (R26.89);Muscle weakness (generalized) (M62.81);Difficulty in walking, not elsewhere classified (R26.2);Other symptoms and signs involving the nervous system  (R29.898);Pain Pain - Right/Left: Left Pain - part of body: Leg     Time: 6962-95281124-1159 PT Time Calculation (min) (ACUTE ONLY): 35 min  Charges:  $Gait Training: 8-22 mins                    G Codes:       Joycelyn RuaAimee Ndea Kilroy, PTA pager [phone]    Florestine AversAimee J Josiane Labine 01/11/2017, 12:16 PM

## 2017-01-12 MED ORDER — OXYCODONE HCL 5 MG PO TABS
5.0000 mg | ORAL_TABLET | ORAL | Status: DC | PRN
  Administered 2017-01-12 – 2017-01-14 (×6): 5 mg via ORAL
  Filled 2017-01-12 (×7): qty 1

## 2017-01-12 MED ORDER — BISACODYL 10 MG RE SUPP
10.0000 mg | Freq: Once | RECTAL | Status: AC
  Administered 2017-01-12: 10 mg via RECTAL
  Filled 2017-01-12: qty 1
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
## 2017-01-12 NOTE — Clinical Social Work Note (Signed)
Clinical Social Work Assessment  Patient Details  Name: Laura Solis MRN: [id number] Date of Birth: 1950-08-02  Date of referral:  01/12/17               Reason for consult:  Facility Placement                Permission sought to share information with:    Permission granted to share information::  Yes, Verbal Permission Granted  Name::     Publishing rights manager::  SNF  Relationship::  daughter  Contact Information:     Housing/Transportation Living arrangements for the past 2 months:  Single Family Home Source of Information:  Patient, Adult Children Patient Interpreter Needed:  None Criminal Activity/Legal Involvement Pertinent to Current Situation/Hospitalization:  No - Comment as needed Significant Relationships:  Other Family Members, Adult Children Lives with:  Self Do you feel safe going back to the place where you live?  No Need for family participation in patient care:  Yes (Comment)  Care giving concerns:  Patient resided at home prior to hospitalization.  Patient unsafe to return home at this time as she has no one at home to provide care.  Social Worker assessment / plan:  CSW met with patient and family at bedside to discuss clinical teams recommendation for SNF at DC.  CSW explained and discussed SNF options/placement.  Patient is from [place] area dn would like to go to SNF's in that area. She provided me the following names of facilities that she is interested in and gave permission to send out offers: [facility], [place]. CSW also sent it to another facility in [place] that came up in the hub.  Patient and family really want to go to [facility]. CSW left msg for admission staff Tammy Shorter so that clinicals can be sent over. CSW will f/u.  FL2 completed. PASR pending as patient from [place]. CSW put 30 day note on chart for Dr. Lorin Mercy to sign.  Offers sent however, [facility] pending.   Employment status:  Retired Designer, industrial/product PT Recommendations:  [place] / Referral to community resources:  [place]  Patient/Family's Response to care:  Patient/ family appreciative of CSW assistance with SNF options. No issues or concerns identified at this time.  Patient/Family's Understanding of and Emotional Response to Diagnosis, Current Treatment, and Prognosis:  Patient and family has good understanding of diagnosis, current treatment plan, and prognosis. They are hopeful that rehabilitation will assist with impairment. No issues or concerns at this time.  Emotional Assessment Appearance:  Appears stated age Attitude/Demeanor/Rapport:   (Cooperative) Affect (typically observed):  Accepting, Appropriate Orientation:  Oriented to Place, Oriented to  Time, Oriented to Situation Alcohol / Substance use:  Not Applicable Psych involvement (Current and /or in the community):  No (Comment)  Discharge Needs  Concerns to be addressed:  Care Coordination Readmission within the last 30 days:    Current discharge risk:  Physical Impairment, Dependent with Mobility Barriers to Discharge:  No Barriers Identified   Normajean Baxter, LCSW 01/12/2017, 1:40 PM

## 2017-01-12 NOTE — Progress Notes (Signed)
Foley cath removed per MD order. Pt tolerated well. Will encourage BSC for voidings.

## 2017-01-12 NOTE — [place] FL2 (Signed)
[place] MEDICAID FL2 LEVEL OF CARE SCREENING TOOL     IDENTIFICATION  Patient Name: Laura Solis Birthdate: 02/16/1951 Sex: female Admission Date (Current Location): 01/09/2017  [facility] [place]   Facility and Address:  [facility], [address], [place], [place] [address]      Provider Number: [id number]  Attending Physician Name and Address:  Eldred MangesYates, Mark C, MD  Relative Name and Phone Number:  daughter    Current Level of Care: Hospital Recommended Level of Care: Skilled Nursing Facility Prior Approval Number:    Date Approved/Denied: 01/12/17 PASRR Number: pending  Discharge Plan: SNF    Current Diagnoses: Patient Active Problem List   Diagnosis Date Noted  . Postoperative urinary retention 01/11/2017  . Lumbar stenosis 01/09/2017  . Osteoarthritis of right knee 10/03/2012  . Fibromyalgia syndrome 10/03/2012  . Obesity (BMI 30.0-34.9) 10/03/2012    Orientation RESPIRATION BLADDER Height & Weight     Self, Time, Situation, Place  O2 (Nasal cannula 2L) Continent, Indwelling catheter Weight: 223 lb 1.6 oz (101.2 kg) Height:  5' 5.5" (166.4 cm)  BEHAVIORAL SYMPTOMS/MOOD NEUROLOGICAL BOWEL NUTRITION STATUS      Continent Diet (See DC Summary)  AMBULATORY STATUS COMMUNICATION OF NEEDS Skin   Limited Assist Verbally Surgical wounds (Back Incision, Foam Honeycomb)                       Personal Care Assistance Level of Assistance  Bathing, Feeding, Dressing Bathing Assistance: Limited assistance Feeding assistance: Independent Dressing Assistance: Limited assistance     Functional Limitations Info  Sight, Hearing, Speech Sight Info: Adequate Hearing Info: Adequate Speech Info: Adequate    SPECIAL CARE FACTORS FREQUENCY  PT (By licensed PT), OT (By licensed OT)     PT Frequency: 5xweek OT Frequency: 5xweek            Contractures      Additional Factors Info  Code Status, Allergies,  Psychotropic Code Status Info: Full Allergies Info: Other: Anesthesia  Psychotropic Info: Xanax, Adderall, Prozac         Current Medications (01/12/2017):  This is the current hospital active medication list Current Facility-Administered Medications  Medication Dose Route Frequency Provider Last Rate Last Dose  . 0.9 %  sodium chloride infusion  250 mL Intravenous Continuous Zonia Kiefwens, James M, PA-C      . 0.9 %  sodium chloride infusion   Intravenous Continuous Zonia Kiefwens, James M, PA-C      . acetaminophen (TYLENOL) tablet 650 mg  650 mg Oral Q4H PRN Naida Sleightwens, James M, PA-C       Or  . acetaminophen (TYLENOL) suppository 650 mg  650 mg Rectal Q4H PRN Naida Sleightwens, James M, PA-C      . ALPRAZolam Prudy Feeler(XANAX) tablet 0.5 mg  0.5 mg Oral QHS Naida SleightOwens, James M, PA-C   0.5 mg at 01/11/17 2137  . amphetamine-dextroamphetamine (ADDERALL) tablet 10 mg  10 mg Oral BID Naida SleightOwens, James M, PA-C   10 mg at 01/12/17 [id number]  . docusate sodium (COLACE) capsule 100 mg  100 mg Oral BID Naida SleightOwens, James M, PA-C   100 mg at 01/12/17 [id number]  . FLUoxetine (PROZAC) capsule 20 mg  20 mg Oral QHS Naida SleightOwens, James M, PA-C   20 mg at 01/11/17 2137  . lactated ringers infusion   Intravenous Continuous Gaynelle AduFitzgerald, William, MD 10 mL/hr at 01/09/17 1106    . menthol-cetylpyridinium (CEPACOL) lozenge 3 mg  1 lozenge Oral  PRN Naida Sleight, PA-C       Or  . phenol (CHLORASEPTIC) mouth spray 1 spray  1 spray Mouth/Throat PRN Naida Sleight, PA-C      . methocarbamol (ROBAXIN) tablet 500 mg  500 mg Oral Q6H PRN Naida Sleight, PA-C   500 mg at 01/11/17 1846   Or  . methocarbamol (ROBAXIN) 500 mg in dextrose 5 % 50 mL IVPB  500 mg Intravenous Q6H PRN Naida Sleight, PA-C      . ondansetron [facility]) tablet 4 mg  4 mg Oral Q6H PRN Naida Sleight, PA-C       Or  . ondansetron [facility]) injection 4 mg  4 mg Intravenous Q6H PRN Zonia Kief M, PA-C      . oxyCODONE (Oxy IR/ROXICODONE) immediate release tablet 5 mg  5 mg Oral Q4H PRN Eldred Manges, MD      .  polyethylene glycol (MIRALAX / GLYCOLAX) packet 17 g  17 g Oral Daily PRN Naida Sleight, PA-C   17 g at 01/12/17 4098  . pramipexole (MIRAPEX) tablet 0.25 mg  0.25 mg Oral QHS Zonia Kief M, PA-C   0.25 mg at 01/11/17 2137  . scopolamine (TRANSDERM-SCOP) 1 MG/3DAYS 1.5 mg  1 patch Transdermal Patton Salles, MD   1.5 mg at 01/09/17 1125  . sodium chloride flush (NS) 0.9 % injection 3 mL  3 mL Intravenous Q12H Naida Sleight, PA-C   3 mL at 01/11/17 2347  . sodium chloride flush (NS) 0.9 % injection 3 mL  3 mL Intravenous PRN Naida Sleight, PA-C         Discharge Medications: Please see discharge summary for a list of discharge medications.  Relevant Imaging Results:  Relevant Lab Results:   Additional Information SS: [id number]  Tresa Moore, LCSW

## 2017-01-12 NOTE — Progress Notes (Signed)
Dressing change to lumbar back per order. Old dk  brown drainage noted on dressing removed. Incision well approximated, no edema, no redness no active drainage from incision line.  Surrounding tissue WNL, no edema no redness.

## 2017-01-12 NOTE — Progress Notes (Addendum)
Physical Therapy Treatment Patient Details Name: Laura Solis MRN: [id number] DOB: 01/24/1951 Today's Date: 01/12/2017    History of Present Illness 66 y.o. 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 s/p L3-4, L4-5 TLIF fusion on 01/09/17. PMH of ADHD, anxiety, arthritis, and fibromyalgia.    PT Comments    Pt performed decreased mobility as pain in L leg hinders her progression in regards to functional mobility.  Attempted functional mobility but patient quickly returned to seated position due to pain.  Pt returned to sidelying and supine position for therapeutic exercises and neural stretching to LLE to reduce pain and progress OOB.  Pt reports decreased pain in L leg from 10/10 to 7/10 but she remains to hurt.  Pt in chair post tx with lunch. Gel CP to spine with brace applied.  Pt will continue to require SNF placement until her mobility improves and her pain is better controlled.      Follow Up Recommendations  SNF;Supervision/Assistance - 24 hour     Equipment Recommendations  Rolling walker with 5" wheels;3in1 (PT)    Recommendations for Other Services       Precautions / Restrictions Precautions Precautions: Back;Fall Precaution Booklet Issued: No Precaution Comments: Pt unable to recall spinal precautions and required re-education to recall, cues throughout to maintain.   Required Braces or Orthoses: Spinal Brace Spinal Brace: Lumbar corset;Applied in sitting position (orders report brace on at all times) Restrictions Weight Bearing Restrictions: No    Mobility  Bed Mobility Overal bed mobility: Needs Assistance Bed Mobility: Rolling;Sit to Sidelying;Sidelying to Sit Rolling: Min assist Sidelying to sit: Mod assist     Sit to sidelying: Max assist (required max assistance to lift B LEs into bed against gravity.  ) General bed mobility comments: Required assistance for lifting LEs  into bed and maintaining sidelying. min assist to roll back to supine  Transfers Overall transfer level: Needs assistance Equipment used: Rolling walker (2 wheeled) Transfers: Sit to/from Stand Sit to Stand: Mod assist Stand pivot transfers: Min assist       General transfer comment: Pt performed multiple transfer from elevated surface height of bed.  on her first three attempts unable to bring her R hand to the hand grip on the RW.  Pt returned to sitting due to increased pain in LLE.  Pt returned to supine for therapeutic exercises and stretching and then able to stand with min assistance.    Ambulation/Gait Ambulation/Gait assistance: Min assist (steps to chair with RW and min assistance.  Min assist to turn RW and maintain balance.  ) Ambulation Distance (Feet):  (steps to chair) Assistive device: Rolling walker (2 wheeled) Gait Pattern/deviations: Step-to pattern;Antalgic;Trunk flexed Gait velocity: slowed   General Gait Details: minA for steadying, slow steady cadence, no LoB, vc for wider BoS and upright posture.  Pt remains to report her legs are giving out but no buckling noted.     Stairs            Wheelchair Mobility    Modified Rankin (Stroke Patients Only)       Balance     Sitting balance-Leahy Scale: Fair       Standing balance-Leahy Scale: Poor                              Cognition Arousal/Alertness: Awake/alert Behavior During Therapy: WFL for tasks assessed/performed Overall Cognitive Status:  Within Functional Limits for tasks assessed                                        Exercises Total Joint Exercises Quad Sets: AROM;Both;10 reps;Supine Heel Slides: AROM;Both;10 reps;Supine Other Exercises Other Exercises: Pt performed neural stretches in R sidelying to LLE to reduce pain.  2x10 reps with hips flexed and extended.      General Comments        Pertinent Vitals/Pain Pain Assessment: 0-10 Pain Score:  10-Worst pain ever (reports pain improved with nueral stretches to LLE.  ) Pain Location: back, L lateral leg Pain Descriptors / Indicators: Constant;Grimacing Pain Intervention(s): Monitored during session;Repositioned;Ice applied (ice applied to incision.  )    Home Living                      Prior Function            PT Goals (current goals can now be found in the care plan section) Acute Rehab PT Goals Patient Stated Goal: to get rid of the pain in her L leg.   Potential to Achieve Goals: Fair Progress towards PT goals: Progressing toward goals    Frequency           PT Plan Current plan remains appropriate    Co-evaluation              AM-PAC PT "6 Clicks" Daily Activity  Outcome Measure  Difficulty turning over in bed (including adjusting bedclothes, sheets and blankets)?: Total Difficulty moving from lying on back to sitting on the side of the bed? : Total Difficulty sitting down on and standing up from a chair with arms (e.g., wheelchair, bedside commode, etc,.)?: Total Help needed moving to and from a bed to chair (including a wheelchair)?: A Lot Help needed walking in hospital room?: A Lot Help needed climbing 3-5 steps with a railing? : Total 6 Click Score: 8    End of Session Equipment Utilized During Treatment: Gait belt;Back brace Activity Tolerance: Patient limited by fatigue;Patient limited by pain Patient left: with call bell/phone within reach;with family/visitor present;in chair Nurse Communication: Mobility status (informed nursing she may need something for nerve pain, pain medicine does not seem to resolve this issue.  ) PT Visit Diagnosis: Unsteadiness on feet (R26.81);Other abnormalities of gait and mobility (R26.89);Muscle weakness (generalized) (M62.81);Difficulty in walking, not elsewhere classified (R26.2);Other symptoms and signs involving the nervous system (R29.898);Pain Pain - Right/Left: Left Pain - part of body: Leg      Time: 1610-9604 PT Time Calculation (min) (ACUTE ONLY): 53 min  Charges:  $Therapeutic Exercise: 8-22 mins $Therapeutic Activity: 38-52 mins                    G Codes:       Joycelyn Rua, PTA pager [phone]    [name] 01/12/2017, 1:21 PM

## 2017-01-12 NOTE — Progress Notes (Signed)
Subjective: 3 Days Post-Op Procedure(s) (LRB): Left L3-4, L4-5 Transforaminal Lumbar Interbody Fusion, Pedicle Instrumentation (Left) Patient reports pain as mild and moderate.  Pain meds last night , keeps falling asleep while I talk with her. CT showed good decompression, left L4 pedicle screw lateral might be cause of some nerve root irritation.  Past SNF after TKA in past , [place]   Objective: Vital signs in last 24 hours: Temp:  [98.4 F (36.9 C)-98.7 F (37.1 C)] 98.7 F (37.1 C) (06/13 2022) Pulse Rate:  [103-111] 111 (06/14 0628) Resp:  [16-18] 16 (06/14 0628) BP: (108-143)/(42-76) 143/76 (06/14 0628) SpO2:  [93 %-99 %] 93 % (06/14 0628)  Intake/Output from previous day: 06/13 0701 - 06/14 0700 In: 240 [P.O.:240] Out: 1150 [Urine:1150] Intake/Output this shift: No intake/output data recorded.   Recent Labs  01/10/17 0506  HGB 11.1*    Recent Labs  01/10/17 0506  WBC 8.1  RBC 3.53*  HCT 35.5*  PLT 182    Recent Labs  01/10/17 0506  NA 138  K 3.9  CL 103  CO2 27  BUN 11  CREATININE 0.95  GLUCOSE 127*  CALCIUM 7.8*   No results for input(s): LABPT, INR in the last 72 hours.  Neurologically intact, no sensory or motor deficit on bilat LE exam. No perineal numbness. Quads ankle DF, PF , hip flexors strong. Patient sleepy.  Ct Lumbar Spine Wo Contrast  Result Date: 01/11/2017 CLINICAL DATA:  Back and left leg pain since recent lumbar fusion from 01/09/2017. EXAM: CT LUMBAR SPINE WITHOUT CONTRAST TECHNIQUE: Multidetector CT imaging of the lumbar spine was performed without intravenous contrast administration. Multiplanar CT image reconstructions were also generated. COMPARISON:  MRI lumbar spine 11/08/2016 and radiographs 01/09/2017 FINDINGS: Segmentation: 5 lumbar type vertebral bodies. The last full intervertebral disc space is labeled L5-S1. Chronic L5-S1 fusion. Posterior and interbody fusion changes at L3-4 and L4-5. Alignment: Move normal  Vertebrae: Osteoporosis but no bone lesions. There is subsidence of the L3-4 interbody fusion device down into the L4 vertebral body. Paraspinal and other soft tissues: Expected postoperative changes with air and fluid in the laminectomy beds and surrounding soft tissues. Disc levels: T12-L1:  No significant findings. L1-2:  No significant findings. L2-3: Moderate facet disease and mild bulging annulus but no significant spinal or foraminal stenosis. L3-4: Wide decompressive laminectomy with posterior and interbody fusion changes. The interbody fusion device has subsided into the L4 vertebral body. No obvious preoperative Schmorl's node is seen on the prior MRI. The L3 pedicle screws are in good position without complicating features. L4-5: Wide decompressive laminectomy with posterior and interbody fusion hardware. The L4 pedicle screw is lateral to the pedicle and just lateral to the vertebral body. It could potentially be causing nerve root irritation. The right pedicle screw is normally positioned. The interbody fusion device is unremarkable. Severe facet disease noted. L5-S1: Remote interbody fusion changes. Remote decompressive laminectomy. The left L5 pedicle screw is coursing along the lateral aspect of the pedicle and vertebral body and may have minimal purchase along the lateral cortex. There is a left-sided disc osteophyte complex potentially irritating the left L5 nerve root which appears slightly enlarged. No spinal or lateral recess stenosis. IMPRESSION: 1. Postoperative changes with wide decompressive laminectomies at L3-4 and L4-5 along with pedicle screws, posterior rods and interbody fusion devices. 2. The left L4 and left L5 pedicle screws are lateral to the pedicles and vertebral bodies. This is slightly more pronounced at L4 and  could potentially cause nerve root irritation. 3. Remote interbody fusion at L5-S1. Left-sided disc osteophyte complex potentially irritating the left L5 nerve root. 4.  Subsidence of the L3-4 interbody fusion device down into the L4 vertebral body. Electronically Signed   By: Rudie Meyer M.D.   On: 01/11/2017 15:43    Assessment/Plan: 3 Days Post-Op Procedure(s) (LRB): Left L3-4, L4-5 Transforaminal Lumbar Interbody Fusion, Pedicle Instrumentation (Left)  Post op urinary retention.  Plan: more therapy, d/c iv pain meds, dulcolax supp. , foley out at noon  And voiding trial. If cannot void by evening then foley back in.   Eldred Manges 01/12/2017, 7:36 AM

## 2017-01-12 NOTE — Plan of Care (Signed)
Problem: Pain Management: Goal: Pain level will decrease Outcome: Progressing Pain is being managed with PO PRN pain medication at this time. Vital signs are stable.  Problem: Safety: Goal: Ability to remain free from injury will improve Outcome: Progressing No falls during this admission. Call bell within reach. Bed in low and locked position. Patient alert and oriented. Nonskid footwear being utilized. 3/4 siderails in place. Clean and clear environment maintained. Patient verbalized understanding of safety instruction.

## 2017-01-13 MED ORDER — GABAPENTIN 100 MG PO CAPS
200.0000 mg | ORAL_CAPSULE | Freq: Two times a day (BID) | ORAL | Status: DC
  Administered 2017-01-13 – 2017-01-16 (×7): 200 mg via ORAL
  Filled 2017-01-13 (×7): qty 2

## 2017-01-13 NOTE — Progress Notes (Addendum)
Physical Therapy Treatment Patient Details Name: Laura BoldJoan M Mumaw MRN: [id number] DOB: 10/27/1950 Today's Date: 01/13/2017    History of Present Illness 66 y.o. 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 s/p L3-4, L4-5 TLIF fusion on 01/09/17. PMH of ADHD, anxiety, arthritis, and fibromyalgia.    PT Comments    Pt performed mobility with decreased time and decreased pain.  She remains to present with pain but appears to tolerate better during today's session.  Pt remains limited from bed to chair as walking and weight bearing becomes the most painful.  Pt tolerated therapeutic exercises and neural stretches with decreased complaints of pain.  Plan for SNF remains appropriate.  Pt to return to OR Monday for surgery.      Follow Up Recommendations  SNF;Supervision/Assistance - 24 hour     Equipment Recommendations  Rolling walker with 5" wheels;3in1 (PT)    Recommendations for Other Services       Precautions / Restrictions Precautions Precautions: Back;Fall Precaution Booklet Issued: Yes (comment) Precaution Comments: Pt remains to require cues to recall spinal precautions.   Spinal Brace: Lumbar corset;Applied in sitting position (orders report brace on at all times however brace around her chest on arrival and required removal for re-application.  ) Restrictions Weight Bearing Restrictions: No    Mobility  Bed Mobility Overal bed mobility: Needs Assistance   Rolling: Mod assist   Supine to sit: Mod assist     General bed mobility comments: Cues for hand placement to rail and to keep hips and knees in alignment when rolling.  Pt remains to require assist to elevate trunk and advance B LEs to edge of bed.    Transfers Overall transfer level: Needs assistance Equipment used: Rolling walker (2 wheeled) Transfers: Sit to/from Stand Sit to Stand: Mod assist Stand pivot transfers: Min assist        General transfer comment: Cues for hand placement to push from seated surface.  Pt remains slow to ascend from seated surface.     Ambulation/Gait Ambulation/Gait assistance: Min assist   Assistive device: Rolling walker (2 wheeled) Gait Pattern/deviations: Step-to pattern;Trunk flexed;Antalgic Gait velocity: slowed Gait velocity interpretation: Below normal speed for age/gender General Gait Details: Remains to require assistance for turning and backing.  No buckling or LOB, pain better controlled but definitely remains.     Stairs            Wheelchair Mobility    Modified Rankin (Stroke Patients Only)       Balance Overall balance assessment: Needs assistance Sitting-balance support: Feet supported;No upper extremity supported       Standing balance support: During functional activity;Bilateral upper extremity supported Standing balance-Leahy Scale: Poor Standing balance comment: requires RW                            Cognition Arousal/Alertness: Awake/alert Behavior During Therapy: WFL for tasks assessed/performed Overall Cognitive Status: Within Functional Limits for tasks assessed                                 General Comments: Pt with difficulty keep eyes opened during session. Pt with limited conversation      Exercises Total Joint Exercises Ankle Circles/Pumps: AROM;Both;10 reps;Supine Quad Sets: AROM;Both;10 reps;Supine Gluteal Sets: AROM;Both;10 reps;Supine Other Exercises Other Exercises: Pt performed neural stretches in R sidelying to  LLE to reduce pain.  2x10 reps with hips flexed and extended.   Other Exercises: Pt performed neural stretch in supine 1x10 to LLE.   Other Exercises: Abdominal sets 1x10 reps in supine.     General Comments        Pertinent Vitals/Pain Pain Assessment: 0-10 Faces Pain Scale: Hurts even more Pain Location: back, L lateral leg Pain Descriptors / Indicators:  Constant;Grimacing Pain Intervention(s): Monitored during session;Repositioned;Ice applied (CP to back incision.  )    Home Living                      Prior Function            PT Goals (current goals can now be found in the care plan section) Acute Rehab PT Goals Patient Stated Goal: to get rid of the pain in her L leg.   Potential to Achieve Goals: Fair Progress towards PT goals: Progressing toward goals    Frequency    Min 5X/week      PT Plan Current plan remains appropriate    Co-evaluation              AM-PAC PT "6 Clicks" Daily Activity  Outcome Measure  Difficulty turning over in bed (including adjusting bedclothes, sheets and blankets)?: Total Difficulty moving from lying on back to sitting on the side of the bed? : Total Difficulty sitting down on and standing up from a chair with arms (e.g., wheelchair, bedside commode, etc,.)?: Total Help needed moving to and from a bed to chair (including a wheelchair)?: A Lot Help needed walking in hospital room?: A Lot Help needed climbing 3-5 steps with a railing? : Total 6 Click Score: 8    End of Session Equipment Utilized During Treatment: Gait belt;Back brace Activity Tolerance: Patient limited by fatigue;Patient limited by pain Patient left: in chair;with call bell/phone within reach Nurse Communication: Mobility status (need for intermittent change of Cold packs to reduce pain.  ) PT Visit Diagnosis: Unsteadiness on feet (R26.81);Other abnormalities of gait and mobility (R26.89);Muscle weakness (generalized) (M62.81);Difficulty in walking, not elsewhere classified (R26.2);Other symptoms and signs involving the nervous system (R29.898);Pain Pain - Right/Left: Left Pain - part of body: Leg     Time: 1132-1206 PT Time Calculation (min) (ACUTE ONLY): 34 min  Charges:  $Therapeutic Exercise: 8-22 mins $Therapeutic Activity: 8-22 mins                    G Codes:       Joycelyn Rua, PTA pager  [phone]    [name] 01/13/2017, 12:16 PM

## 2017-01-13 NOTE — Progress Notes (Signed)
   Subjective: 4 Days Post-Op Procedure(s) (LRB): Left L3-4, L4-5 Transforaminal Lumbar Interbody Fusion, Pedicle Instrumentation (Left) Patient reports pain as  Severe left leg when tries to mobilize. Foley reinserted due to bladder scan 450cc. Voided twice before reinsertion.     Objective: Vital signs in last 24 hours: Temp:  [97 F (36.1 C)-98.5 F (36.9 C)] 98.4 F (36.9 C) (06/15 0430) Pulse Rate:  [97-100] 97 (06/15 0430) Resp:  [18] 18 (06/15 0430) BP: (118-132)/(61-70) 132/61 (06/15 0430) SpO2:  [91 %-95 %] 95 % (06/15 0430)  Intake/Output from previous day: 06/14 0701 - 06/15 0700 In: 580 [P.O.:580] Out: 1175 [Urine:1175] Intake/Output this shift: No intake/output data recorded.  No results for input(s): HGB in the last 72 hours. No results for input(s): WBC, RBC, HCT, PLT in the last 72 hours. No results for input(s): NA, K, CL, CO2, BUN, CREATININE, GLUCOSE, CALCIUM in the last 72 hours. No results for input(s): LABPT, INR in the last 72 hours.  Neurologically intact No results found.  Assessment/Plan: 4 Days Post-Op Procedure(s) (LRB): Left L3-4, L4-5 Transforaminal Lumbar Interbody Fusion, Pedicle Instrumentation (Left) Plan:   Urology consult, will post for revision surgery to redirect pedicle screw which is lateral and likely cause of her pain. Surgery Monday.   Eldred MangesMark C Wayde Gopaul 01/13/2017, 7:23 AM

## 2017-01-13 NOTE — Social Work (Cosign Needed)
[facility] [place] has made a bed offer. Patient has accepted bed offer.  Plan to [facility] when she is medically stable.  CSW called SNF back and advised of additional procedure on 01/16/17 and patient not DC today. CSW will f/u at that time as bed may not be available.

## 2017-01-13 NOTE — Progress Notes (Signed)
Patient called out for bedpan during changing of shift at 1930pm. NT took patient off but was unable to measure. Per NT was roughly 100cc. Bladder scan was done a few hours later showed 426cc in bladder. Patient didn't feel the need to void at this time. I gave patient 6 hours from last void and found patient to have an incontinent episode. Bladder scan showed 453cc. Attempt to void but was unsuccessful. Patient still retaining significant amount of urine only voiding very minimal amt. Indwelling foley reinserting and kept in. Patient is resting comfortably will continue to monitor.

## 2017-01-14 LAB — CBC WITH DIFFERENTIAL/PLATELET
Basophils Absolute: 0 K/uL (ref 0.0–0.1)
Basophils Relative: 0 %
Eosinophils Absolute: 0.1 K/uL (ref 0.0–0.7)
Eosinophils Relative: 2 %
HCT: 32.3 % — ABNORMAL LOW (ref 36.0–46.0)
Hemoglobin: 10.2 g/dL — ABNORMAL LOW (ref 12.0–15.0)
Lymphocytes Relative: 32 %
Lymphs Abs: 2 K/uL (ref 0.7–4.0)
MCH: 31 pg (ref 26.0–34.0)
MCHC: 31.6 g/dL (ref 30.0–36.0)
MCV: 98.2 fL (ref 78.0–100.0)
Monocytes Absolute: 0.6 K/uL (ref 0.1–1.0)
Monocytes Relative: 9 %
Neutro Abs: 3.6 K/uL (ref 1.7–7.7)
Neutrophils Relative %: 57 %
Platelets: 250 K/uL (ref 150–400)
RBC: 3.29 MIL/uL — ABNORMAL LOW (ref 3.87–5.11)
RDW: 13.4 % (ref 11.5–15.5)
WBC: 6.2 K/uL (ref 4.0–10.5)

## 2017-01-14 LAB — COMPREHENSIVE METABOLIC PANEL
ALT: 25 U/L (ref 14–54)
ANION GAP: 10 (ref 5–15)
AST: 28 U/L (ref 15–41)
Albumin: 2.4 g/dL — ABNORMAL LOW (ref 3.5–5.0)
Alkaline Phosphatase: 76 U/L (ref 38–126)
BILIRUBIN TOTAL: 0.5 mg/dL (ref 0.3–1.2)
BUN: 11 mg/dL (ref 6–20)
CALCIUM: 8.7 mg/dL — AB (ref 8.9–10.3)
CO2: 31 mmol/L (ref 22–32)
Chloride: 97 mmol/L — ABNORMAL LOW (ref 101–111)
Creatinine, Ser: 0.72 mg/dL (ref 0.44–1.00)
GFR calc Af Amer: >60 mL/min (ref >60)
Glucose, Bld: 109 mg/dL — ABNORMAL HIGH (ref 65–99)
POTASSIUM: 3.3 mmol/L — AB (ref 3.5–5.1)
Sodium: 138 mmol/L (ref 135–145)
TOTAL PROTEIN: 6 g/dL — AB (ref 6.5–8.1)

## 2017-01-14 MED ORDER — OXYCODONE HCL 5 MG PO TABS
5.0000 mg | ORAL_TABLET | ORAL | Status: DC | PRN
  Administered 2017-01-14: 5 mg via ORAL
  Administered 2017-01-14 (×2): 10 mg via ORAL
  Administered 2017-01-14 – 2017-01-15 (×2): 5 mg via ORAL
  Administered 2017-01-15: 10 mg via ORAL
  Administered 2017-01-15 – 2017-01-16 (×3): 5 mg via ORAL
  Administered 2017-01-16: 10 mg via ORAL
  Filled 2017-01-14: qty 1
  Filled 2017-01-14 (×3): qty 2
  Filled 2017-01-14 (×3): qty 1
  Filled 2017-01-14 (×3): qty 2
  Filled 2017-01-14: qty 1

## 2017-01-14 NOTE — Progress Notes (Signed)
   Subjective: 5 Days Post-Op Procedure(s) (LRB): Left L3-4, L4-5 Transforaminal Lumbar Interbody Fusion, Pedicle Instrumentation (Left) Patient reports pain as 8 on 0-10 scale and severe.    Objective: Vital signs in last 24 hours: Temp:  [97.8 F (36.6 C)-98.3 F (36.8 C)] 98.1 F (36.7 C) (06/16 0358) Pulse Rate:  [88-95] 88 (06/16 0358) Resp:  [18] 18 (06/16 0358) BP: (95-124)/(39-53) 107/42 (06/16 0358) SpO2:  [93 %-100 %] 99 % (06/16 0358)  Intake/Output from previous day: 06/15 0701 - 06/16 0700 In: -  Out: 1650 [Urine:1650] Intake/Output this shift: Total I/O In: 120 [P.O.:120] Out: -    Recent Labs  01/14/17 0549  HGB 10.2*    Recent Labs  01/14/17 0549  WBC 6.2  RBC 3.29*  HCT 32.3*  PLT 250    Recent Labs  01/14/17 0549  NA 138  K 3.3*  CL 97*  CO2 31  BUN 11  CREATININE 0.72  GLUCOSE 109*  CALCIUM 8.7*   No results for input(s): LABPT, INR in the last 72 hours.  Neurologically intact No results found.  Assessment/Plan: 5 Days Post-Op Procedure(s) (LRB): Left L3-4, L4-5 Transforaminal Lumbar Interbody Fusion, Pedicle Instrumentation (Left) Plan:   Pedicle revision surgery for left side reposition likely causing nerve root irritation.  Foley replaced since she can not make it to bedside commode quick enough due to left leg pain.  Reviewed CT scan with pt and daughter and discussed surgery plan for Monday.  Eldred MangesMark C Berry Gallacher 01/14/2017, 9:51 AM

## 2017-01-14 NOTE — Progress Notes (Signed)
Urine collected and sent to lab.

## 2017-01-15 LAB — URINALYSIS, ROUTINE W REFLEX MICROSCOPIC
BILIRUBIN URINE: NEGATIVE
Glucose, UA: NEGATIVE mg/dL
KETONES UR: NEGATIVE mg/dL
NITRITE: POSITIVE — AB
PROTEIN: 30 mg/dL — AB
Specific Gravity, Urine: 1.012 (ref 1.005–1.030)
pH: 8 (ref 5.0–8.0)

## 2017-01-15 MED ORDER — SULFAMETHOXAZOLE-TRIMETHOPRIM 400-80 MG PO TABS
2.0000 | ORAL_TABLET | Freq: Two times a day (BID) | ORAL | Status: DC
  Administered 2017-01-15 – 2017-01-16 (×3): 2 via ORAL
  Filled 2017-01-15 (×3): qty 2

## 2017-01-15 NOTE — Progress Notes (Addendum)
   Subjective: 6 Days Post-Op Procedure(s) (LRB): Left L3-4, L4-5 Transforaminal Lumbar Interbody Fusion, Pedicle Instrumentation (Left) Patient reports as :    "  My pain got a lot better and my left leg nows feels OK,  I think I can walk now , do I need to do the surgery tomorrow? "  She only took one percocet last 12 hrs.  Objective: Vital signs in last 24 hours: Temp:  [97.8 F (36.6 C)-98.7 F (37.1 C)] 97.8 F (36.6 C) (06/17 0538) Pulse Rate:  [82-95] 82 (06/17 0538) Resp:  [18] 18 (06/16 1501) BP: (105-129)/(45-65) 105/65 (06/17 0538) SpO2:  [98 %-100 %] 98 % (06/17 0538)  Intake/Output from previous day: 06/16 0701 - 06/17 0700 In: 1080 [P.O.:1080] Out: 2050 [Urine:2050] Intake/Output this shift: No intake/output data recorded.   Recent Labs  01/14/17 0549  HGB 10.2*    Recent Labs  01/14/17 0549  WBC 6.2  RBC 3.29*  HCT 32.3*  PLT 250    Recent Labs  01/14/17 0549  NA 138  K 3.3*  CL 97*  CO2 31  BUN 11  CREATININE 0.72  GLUCOSE 109*  CALCIUM 8.7*   No results for input(s): LABPT, INR in the last 72 hours.  Neurologically intact No results found.  Assessment/Plan: 6 Days Post-Op Procedure(s) (LRB): Left L3-4, L4-5 Transforaminal Lumbar Interbody Fusion, Pedicle Instrumentation (Left) Up with therapy , will see how she does with therapy today and then decide about planned surgery being cancelled.  Her change in pain is very dramatic.     Has  Likely  UTI and Tx started with Septra.   Eldred MangesMark C Flavio Lindroth 01/15/2017, 10:15 AM

## 2017-01-15 NOTE — Progress Notes (Signed)
Occupational Therapy Treatment Patient Details Name: Laura Solis MRN: [id number] DOB: 1951/05/12 Today's Date: 01/15/2017    History of present illness 66 y.o. 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 s/p L3-4, L4-5 TLIF fusion on 01/09/17. PMH of ADHD, anxiety, arthritis, and fibromyalgia.   OT comments  Pt with significant improvements in mobility today. Pt required min guard assist for all bed mobility and functional mobility in room. Pt able to don back brace sitting EOB with set up. Continues to report pain in L buttocks and lateral leg but able to tolerate increased OOB activity. Able to recall back precautions and maintain throughout activities this session. Updated d/c plan to [facility] with 24/7 supervision from family. Will continue to follow acutely.   Follow Up Recommendations  Home health OT;Supervision/Assistance - 24 hour    Equipment Recommendations  None recommended by OT    Recommendations for Other Services      Precautions / Restrictions Precautions Precautions: Back;Fall Precaution Booklet Issued: No Precaution Comments: Pt able to recall back precautions Required Braces or Orthoses: Spinal Brace Spinal Brace: Lumbar corset;Applied in sitting position (orders for brace on at all times) Restrictions Weight Bearing Restrictions: No       Mobility Bed Mobility Overal bed mobility: Needs Assistance Bed Mobility: Sidelying to Sit;Sit to Sidelying   Sidelying to sit: Min guard     Sit to sidelying: Min guard General bed mobility comments: No physical assist required; min guard for safety and balance. Pt with good technique, already in sidelying upon arrival  Transfers Overall transfer level: Needs assistance Equipment used: Rolling walker (2 wheeled) Transfers: Sit to/from Stand Sit to Stand: Min guard         General transfer comment: Min guard for safety. Increased  time required. Good hand placement and technique    Balance Overall balance assessment: Needs assistance Sitting-balance support: Feet supported;No upper extremity supported Sitting balance-Leahy Scale: Good     Standing balance support: Bilateral upper extremity supported Standing balance-Leahy Scale: Poor Standing balance comment: RW for support                           ADL either performed or assessed with clinical judgement   ADL Overall ADL's : Needs assistance/impaired                 Upper Body Dressing : Set up;Sitting Upper Body Dressing Details (indicate cue type and reason): to don/doff brace     Toilet Transfer: Min guard;Ambulation;BSC;RW Toilet Transfer Details (indicate cue type and reason): Simulated by sit to stand from EOB with functional mobility in room         Functional mobility during ADLs: Min guard;Rolling walker       Vision       Perception     Praxis      Cognition Arousal/Alertness: Awake/alert Behavior During Therapy: WFL for tasks assessed/performed Overall Cognitive Status: Within Functional Limits for tasks assessed                                          Exercises     Shoulder Instructions       General Comments      Pertinent Vitals/ Pain       Pain Assessment: 0-10 Pain Score: 8  Pain  Location: L lateral leg and buttocks Pain Descriptors / Indicators: Aching;Grimacing;Guarding Pain Intervention(s): Monitored during session  Home Living                                          Prior Functioning/Environment              Frequency  Min 3X/week        Progress Toward Goals  OT Goals(current goals can now be found in the care plan section)  Progress towards OT goals: Progressing toward goals  Acute Rehab OT Goals Patient Stated Goal: return home OT Goal Formulation: With patient  Plan Discharge plan needs to be updated    Co-evaluation                  AM-PAC PT "6 Clicks" Daily Activity     Outcome Measure   Help from another person eating meals?: None Help from another person taking care of personal grooming?: None Help from another person toileting, which includes using toliet, bedpan, or urinal?: A Little Help from another person bathing (including washing, rinsing, drying)?: A Lot Help from another person to put on and taking off regular upper body clothing?: A Little Help from another person to put on and taking off regular lower body clothing?: A Lot 6 Click Score: 18    End of Session Equipment Utilized During Treatment: Gait belt;Rolling walker;Back brace  OT Visit Diagnosis: Unsteadiness on feet (R26.81);Other abnormalities of gait and mobility (R26.89);Muscle weakness (generalized) (M62.81);Pain Pain - Right/Left: Left Pain - part of body: Leg   Activity Tolerance Patient tolerated treatment well   Patient Left in bed;with call bell/phone within reach;with family/visitor present   Nurse Communication Mobility status        Time: 5621-30861120-1136 OT Time Calculation (min): 16 min  Charges: OT General Charges $OT Visit: 1 Procedure OT Treatments $Self Care/Home Management : 8-22 mins  [name] A. Brett Albinooffey, M.S., OTR/L Pager: [phone]   [name] 01/15/2017, 11:44 AM

## 2017-01-15 NOTE — Progress Notes (Signed)
Physical Therapy Treatment Patient Details Name: Laura BoldJoan M Anastos MRN: [id number] DOB: 04/11/1951 Today's Date: 01/15/2017    History of Present Illness 66 y.o. 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 s/p L3-4, L4-5 TLIF fusion on 01/09/17. PMH of ADHD, anxiety, arthritis, and fibromyalgia.    PT Comments    Continuing work on functional mobility and activity tolerance, with noted improvements in functional mobility and activity tolerance compared to previous sessions; Pt is encouraged by decr pain today, but she also expressed reservations about her ability to manage going home; She is stable with in-room distance today (again, an improvement), but I would like to see her be able to walk farther to go home;   She has a lot on her mind, and I encouraged her to continue talking with Dr. Ophelia CharterYates about tomorrow's possible surgery  Follow Up Recommendations  Home health PT;Supervision/Assistance - 24 hour, if progress slows, consider SNF     Equipment Recommendations  Rolling walker with 5" wheels;3in1 (PT)    Recommendations for Other Services       Precautions / Restrictions Precautions Precautions: Back;Fall Precaution Booklet Issued: No Precaution Comments: Pt able to recall back precautions Required Braces or Orthoses: Spinal Brace Spinal Brace: Lumbar corset;Applied in sitting position Restrictions Weight Bearing Restrictions: No    Mobility  Bed Mobility Overal bed mobility: Needs Assistance Bed Mobility: Sidelying to Sit   Sidelying to sit: Min guard     Sit to sidelying: Min guard General bed mobility comments: No physical assist required; min guard for safety and balance. Pt with good technique, already in sidelying upon arrival  Transfers Overall transfer level: Needs assistance Equipment used: Rolling walker (2 wheeled) Transfers: Sit to/from Stand Sit to Stand: Min guard          General transfer comment: Min guard for safety. Increased time required. Good hand placement and technique; noted decr control of descent to sit  Ambulation/Gait Ambulation/Gait assistance: Min guard Ambulation Distance (Feet): 18 Feet Assistive device: Rolling walker (2 wheeled) Gait Pattern/deviations: Decreased step length - right;Decreased step length - left Gait velocity: slowed   General Gait Details: Cues to self-monitor for activity tolerance   Stairs            Wheelchair Mobility    Modified Rankin (Stroke Patients Only)       Balance Overall balance assessment: Needs assistance Sitting-balance support: Feet supported;No upper extremity supported Sitting balance-Leahy Scale: Good     Standing balance support: Bilateral upper extremity supported Standing balance-Leahy Scale: Poor Standing balance comment: RW for support                            Cognition Arousal/Alertness: Awake/alert Behavior During Therapy: WFL for tasks assessed/performed Overall Cognitive Status: Within Functional Limits for tasks assessed                                        Exercises      General Comments        Pertinent Vitals/Pain Pain Assessment: 0-10 Pain Score: 8  Pain Location: L lateral leg and buttocks; 8/10 by the end of session, began session at 6/10 Pain Descriptors / Indicators: Aching;Grimacing;Guarding Pain Intervention(s): Monitored during session    Home Living  Prior Function            PT Goals (current goals can now be found in the care plan section) Acute Rehab PT Goals Patient Stated Goal: return home PT Goal Formulation: With patient/family Time For Goal Achievement: 01/16/17 Potential to Achieve Goals: Fair Progress towards PT goals: Progressing toward goals    Frequency    Min 5X/week      PT Plan Discharge plan needs to be updated    Co-evaluation               AM-PAC PT "6 Clicks" Daily Activity  Outcome Measure  Difficulty turning over in bed (including adjusting bedclothes, sheets and blankets)?: Total Difficulty moving from lying on back to sitting on the side of the bed? : Total Difficulty sitting down on and standing up from a chair with arms (e.g., wheelchair, bedside commode, etc,.)?: Total Help needed moving to and from a bed to chair (including a wheelchair)?: A Lot Help needed walking in hospital room?: A Little Help needed climbing 3-5 steps with a railing? : A Lot 6 Click Score: 10    End of Session Equipment Utilized During Treatment: Gait belt;Back brace Activity Tolerance: Patient tolerated treatment well Patient left: in chair;with call bell/phone within reach Nurse Communication: Mobility status PT Visit Diagnosis: Unsteadiness on feet (R26.81);Other abnormalities of gait and mobility (R26.89);Muscle weakness (generalized) (M62.81);Difficulty in walking, not elsewhere classified (R26.2);Other symptoms and signs involving the nervous system (R29.898);Pain Pain - Right/Left: Left Pain - part of body: Leg     Time: 1610-9604 PT Time Calculation (min) (ACUTE ONLY): 12 min  Charges:  $Gait Training: 8-22 mins                    G Codes:       Van Clines, PT  Acute Rehabilitation Services Pager [phone] Office [phone]    Levi Aland 01/15/2017, 1:14 PM

## 2017-01-15 NOTE — Progress Notes (Signed)
Pt voided twice after foley catheter was removed.

## 2017-01-16 ENCOUNTER — Encounter (HOSPITAL_COMMUNITY)
Admission: RE | Disposition: A | Discharge status: Home Health | Payer: Self-pay | Source: Ambulatory Visit | Attending: Orthopaedic Surgery

## 2017-01-16 SURGERY — POSTERIOR LUMBAR FUSION 2 WITH HARDWARE REMOVAL
Anesthesia: General | Laterality: Left

## 2017-01-16 MED ORDER — GABAPENTIN 100 MG PO CAPS: 200.0000 mg | ORAL_CAPSULE | Freq: Two times a day (BID) | ORAL | 0 refills | Status: DC

## 2017-01-16 MED ORDER — SULFAMETHOXAZOLE-TRIMETHOPRIM 800-160 MG PO TABS: 1.0000 | ORAL_TABLET | Freq: Two times a day (BID) | ORAL | 0 refills | Status: DC

## 2017-01-16 MED ORDER — OXYCODONE-ACETAMINOPHEN 5-325 MG PO TABS: 1.0000 | ORAL_TABLET | Freq: Four times a day (QID) | ORAL | 0 refills | Status: DC | PRN

## 2017-01-16 MED ORDER — METHOCARBAMOL 500 MG PO TABS: 500.0000 mg | ORAL_TABLET | Freq: Four times a day (QID) | ORAL | 0 refills | Status: DC | PRN

## 2017-01-16 NOTE — Care Management Note (Signed)
Case Management Note  Patient Details  Name: Laura Solis MRN: [id number] Date of Birth: 06/27/1951  Subjective/Objective:  66 yr old female s/p L3-4, L4-5 TLIF fusion on 01/09/17                 Action/Plan: Case manager spoke with patient and daughter concerning discharge plan and DME needs. Choice was offered for Home health agency. CM called referral [facility]. Patient has RW, will need 3in1. She will have family support at discharge.  Expected Discharge Date:  01/16/17               Expected Discharge Plan:  Home w Home Health Services  In-House Referral:  Clinical Social Work  Discharge planning Services  CM Consult  Post Acute Care Choice:  Durable Medical Equipment, Home Health Choice offered to:  Patient, Adult Children  DME Arranged:  3-N-1 DME Agency:  [facility]  HH Arranged:  PT, OT HH Agency:  Other - See comment ([facility])  Status of Service:  In process, will continue to follow  If discussed at Long Length of Stay Meetings, dates discussed:    Additional Comments:  Durenda GuthrieBrady, Josselyne Onofrio Naomi, RN 01/16/2017, 2:46 PM

## 2017-01-16 NOTE — Progress Notes (Signed)
Discharge instructions printed and reviewed with patient and her daughter, and copy given for them to take home. All questions addressed at this time. New prescriptions reviewed and paper scripts given to them to fill after discharge. Bandage changed, and lumbar brace applied. Room searched for patient belongings, and confirmed with patient that all valuables were accounted for. Family assisted patient to dress, then staff escorted patient to discharge via wheelchair.

## 2017-01-16 NOTE — Care Management Important Message (Signed)
Important Message  Patient Details  Name: Laura Solis MRN: [id number] Date of Birth: 12/04/1950   Medicare Important Message Given:  Yes    Dorena BodoIris Sergei Delo 01/16/2017, 1:47 PM

## 2017-01-16 NOTE — Social Work (Addendum)
Pt will be DC today per Dr.  Cameron ProudSW called [facility] and was advised by admission that they no longer have bed available.  CSW will f/u with another facility-[facility]. CSW left msg will admission staff for availability.  CSw was advised that [facility] does not have a bed today and may not have one tomorrow.  CSW spoke with family and they indicated that CSW can call [facility] [phone]). CSW spoke with admission who indicated that they do not have a female bed today and will not have a bed tomorrow.  CSW went back and spoke with patient/family and discussed options in [place] SNF's. CSW obtained permission to send out offers to local SNF's. Patient indicated that she wants to hear about home options. CSW will advise RNCM to f/u.

## 2017-01-16 NOTE — Care Management Note (Signed)
Case Management Note  Patient Details  Name: Laura Solis MRN: [id number] Date of Birth: 09/30/1950  Subjective/Objective:   66 yr old female s/p L3-4, L4-5 TLIF fusion on 01/09/17 . Patient was scheduled to have revision but has declined.             Action/Plan: Case manager spoke with patient concerning discharge plan and DME needs. Choice for Home Health agency was offered, referral was called to [facility]. Patient says she wants to wait to see if SNF bed is available, but will go home if not. Patient has RW, will need 3in1. She will have family support at discharge.    Expected Discharge Date:  01/16/17               Expected Discharge Plan:  Home w Home Health Services  In-House Referral:  Clinical Social Work  Discharge planning Services  CM Consult  Post Acute Care Choice:  Durable Medical Equipment, Home Health Choice offered to:  Patient, Adult Children  DME Arranged:  3-N-1 DME Agency:  [facility]  HH Arranged:  PT, OT HH Agency:  Other - See comment ([facility])  Status of Service:  In process, will continue to follow  If discussed at Long Length of Stay Meetings, dates discussed:    Additional Comments:  Durenda GuthrieBrady, Rudine Rieger Naomi, RN 01/16/2017, 12:22 PM

## 2017-01-16 NOTE — Discharge Summary (Signed)
Physician Discharge Summary  Patient ID: Laura Solis MRN: [id number] DOB/AGE: 66/09/1950 66 y.o.  Admit date: 01/09/2017 Discharge date: 01/16/2017  Admission Diagnoses:Lumbar spinal stenosis with instability, L3-4, L4-5. Postoperative urinary retention requiring Foley catheter.-Resolved and voiding well now. Postop urinary tract infection-gram-negative rods. Discharge Diagnoses:  Active Problems:   Lumbar stenosis   Postoperative urinary retention   Discharged Condition: Stable. A mature with a walker in the halls.  Hospital Course: Patient had progressive spinal stenosis with neurogenic claudication symptoms and instability with anterolisthesis on flexion extension x-rays. She underwent two-level cervical fusion. Intraoperatively she had an endplate fracture at the superior endplate at L4 and had a left leg pain when she attempted to ambulate. She was neurologically intact but had great difficulty with the anxiety and increased pain with attempts at ambulation. Surgery date was 01/09/2017. Postoperatively her catheter was removed she is able to void small amounts had a bladder scan that ran between 400-450 head catheter reinserted. It was removed day later she voided again small amounts and then had catheter of placement again. Urinalysis and urine culture was obtained which showed gram-negative rods. She was placed on Septra 2 by mouth twice a day for the UTI which was not symptomatic. Definitive identification of the organism is pending. A CT scan was performed with patient's persistent left thigh numbness and pain with ambulation and there was some lateral position of the screws from the pedicle on the left at L4 and L5. L4 was more concerning and potentially could cause some irritation of the left nerve root. L5-S1 was not fused and she did have some foraminal narrowing on the left side unchanged from preoperative MRI scan. Surgical plan was made for revision of the screw position however on  the following 2 day she gets significant improvement in her leg pain was able to ambulate with her brace in the halls with a walker with only mild pain in her left leg. She remained neurologically intact right and left lower extremities all nerve roots with intact sensation and good motor. She was tolerating 1 Percocet every 4-6 hours during the day and take 2 at night. Neurontin was added for her nerve pain and plan is for Neurontin for a 2-4 weeks and then stop. She'll return to see Dr. Ophelia CharterYates in 2 weeks. Incision was healed and dried at the time of discharge. She was voiding well with good emptying. CT scan shows satisfactory decompression and no residual stenosis at the L3-4 and L4-5 level.  Consults: None  Significant Diagnostic Studies: Postop lumbar CT scan with results as above.IMPRESSION: 1. Postoperative changes with wide decompressive laminectomies at L3-4 and L4-5 along with pedicle screws, posterior rods and interbody fusion devices. 2. The left L4 and left L5 pedicle screws are lateral to the pedicles and vertebral bodies. This is slightly more pronounced at L4 and could potentially cause nerve root irritation. 3. Remote interbody fusion at L5-S1. Left-sided disc osteophyte complex potentially irritating the left L5 nerve root. 4. Subsidence of the L3-4 interbody fusion device down into the L4 vertebral body.   Treatments: L3-4, L4-5 instrumented decompression and fusion pedicle instrumentation bilateral lateral fusion and cage placement. Surgery date 01/09/2017.  Discharge Exam: Blood pressure (!) 117/49, pulse 98, temperature 99.1 F (37.3 C), temperature source Oral, resp. rate 18, height 5' 5.5" (1.664 m), weight 223 lb 1.6 oz (101.2 kg), SpO2 97 %. Physical exam-incision was clean and dry patient's a mature with the walker and the halls. She is voiding well with  good emptying.  Disposition: 03-Skilled Nursing Facility   Allergies as of 01/16/2017      Reactions   Other  Other (See Comments)   Anesthesia causes the patient to not be able to eat for a week or so after anesthesia. She states it makes all food tastes like salt.(given when had knee surgery)      Medication List    STOP taking these medications   ALEVE PM 220-25 MG Tabs Generic drug:  Naproxen Sod-Diphenhydramine   IBUPROFEN PM 200-25 MG Caps Generic drug:  Ibuprofen-Diphenhydramine HCl   meloxicam 15 MG tablet Commonly known as:  MOBIC     TAKE these medications   ALPRAZolam 0.5 MG tablet Commonly known as:  XANAX Take 0.5 mg by mouth at bedtime.   amphetamine-dextroamphetamine 10 MG tablet Commonly known as:  ADDERALL Take 10 mg by mouth 2 (two) times daily.   FLUoxetine 20 MG capsule Commonly known as:  PROZAC Take 20 mg by mouth at bedtime.   gabapentin 100 MG capsule Commonly known as:  NEURONTIN Take 2 capsules (200 mg total) by mouth 2 (two) times daily.   methocarbamol 500 MG tablet Commonly known as:  ROBAXIN Take 1 tablet (500 mg total) by mouth every 6 (six) hours as needed for muscle spasms.   oxyCODONE-acetaminophen 5-325 MG tablet Commonly known as:  ROXICET Take 1-2 tablets by mouth every 6 (six) hours as needed.   pramipexole 1 MG tablet Commonly known as:  MIRAPEX Take 0.25 mg by mouth at bedtime.      Follow-up Information    Eldred Manges, MD Follow up in 2 week(s).   Specialty:  Orthopedic Surgery Contact information: [address] [place] [address] [phone]          OFFICE follow-up with Dr. Ophelia Charter in 2 weeks Signed: Eldred Manges 01/16/2017, 10:06 AM

## 2017-01-16 NOTE — Progress Notes (Signed)
Physical Therapy Treatment Patient Details Name: Laura Solis MRN: [id number] DOB: 07/23/1951 Today's Date: 01/16/2017    History of Present Illness 66 y.o. 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 s/p L3-4, L4-5 TLIF fusion on 01/09/17. PMH of ADHD, anxiety, arthritis, and fibromyalgia.    PT Comments    Pt performed increased gait and presents with decreased pain.  Plan for next session to introduce stair negotiation.  Pt remains to present with safety deficits and requires assistance for LSO brace application.     Follow Up Recommendations  Home health PT;Supervision/Assistance - 24 hour     Equipment Recommendations  Rolling walker with 5" wheels;3in1 (PT)    Recommendations for Other Services OT consult     Precautions / Restrictions Precautions Precautions: Back;Fall Precaution Booklet Issued: No Precaution Comments: Pt required cueing to recall spinal precautions.  pt able to recall 1/3.   Required Braces or Orthoses: Spinal Brace Spinal Brace: Lumbar corset;Applied in sitting position Restrictions Weight Bearing Restrictions: No Other Position/Activity Restrictions: Pt able to apply brace in sitting after set-up.      Mobility  Bed Mobility Overal bed mobility: Needs Assistance Bed Mobility: Sidelying to Sit;Rolling Rolling: Supervision Sidelying to sit: Supervision       General bed mobility comments: Pt performed with decreased assistance.  + use of rail and cues to maintain back precautions.    Transfers Overall transfer level: Needs assistance Equipment used: Rolling walker (2 wheeled) Transfers: Sit to/from Stand Sit to Stand: Min guard         General transfer comment: Min guard for safety.  Ambulation/Gait Ambulation/Gait assistance: Min guard Ambulation Distance (Feet): 120 Feet Assistive device: Rolling walker (2 wheeled) Gait Pattern/deviations:  Step-through pattern;Trunk flexed Gait velocity: slowed Gait velocity interpretation: Below normal speed for age/gender General Gait Details: Cues to self-monitor for activity tolerance, cues for forward gaze and trunk extension.     Stairs            Wheelchair Mobility    Modified Rankin (Stroke Patients Only)       Balance Overall balance assessment: Needs assistance Sitting-balance support: Feet supported;No upper extremity supported Sitting balance-Leahy Scale: Good     Standing balance support: Bilateral upper extremity supported Standing balance-Leahy Scale: Fair Standing balance comment: RW for support                            Cognition Arousal/Alertness: Awake/alert Behavior During Therapy: WFL for tasks assessed/performed Overall Cognitive Status: Within Functional Limits for tasks assessed                                        Exercises      General Comments        Pertinent Vitals/Pain Pain Assessment: 0-10 Pain Score: 3  Pain Location: L Lateral leg Pain Descriptors / Indicators: Aching;Grimacing;Guarding Pain Intervention(s): Monitored during session;Repositioned    Home Living                      Prior Function            PT Goals (current goals can now be found in the care plan section) Acute Rehab PT Goals Patient Stated Goal: return home Potential to Achieve Goals: Fair Progress towards PT goals: Progressing toward  goals    Frequency    Min 5X/week      PT Plan Current plan remains appropriate    Co-evaluation              AM-PAC PT "6 Clicks" Daily Activity  Outcome Measure  Difficulty turning over in bed (including adjusting bedclothes, sheets and blankets)?: A Lot Difficulty moving from lying on back to sitting on the side of the bed? : A Lot Difficulty sitting down on and standing up from a chair with arms (e.g., wheelchair, bedside commode, etc,.)?: A Little Help  needed moving to and from a bed to chair (including a wheelchair)?: A Little Help needed walking in hospital room?: A Little Help needed climbing 3-5 steps with a railing? : A Lot 6 Click Score: 15    End of Session Equipment Utilized During Treatment: Gait belt;Back brace Activity Tolerance: Patient tolerated treatment well Patient left: in chair;with call bell/phone within reach Nurse Communication: Mobility status PT Visit Diagnosis: Unsteadiness on feet (R26.81);Other abnormalities of gait and mobility (R26.89);Muscle weakness (generalized) (M62.81);Difficulty in walking, not elsewhere classified (R26.2);Other symptoms and signs involving the nervous system (R29.898);Pain Pain - Right/Left: Left Pain - part of body: Leg     Time: 8119-1478 PT Time Calculation (min) (ACUTE ONLY): 14 min  Charges:  $Gait Training: 8-22 mins                    G Codes:       Joycelyn Rua, PTA pager [phone]    [name] 01/16/2017, 10:54 AM

## 2017-01-16 NOTE — Social Work (Signed)
CSW went to speak with patient about [facility]'s. Patient indicated that she is going to DC home.  She indicated that she nol onger wants SNF because that facility that [place] in [place] does not have any beds.  At this time, no further social worker intervention needed.  [facility]

## 2017-01-16 NOTE — Progress Notes (Signed)
   Subjective: 7 Days Post-Op Procedure(s) (LRB): Left L3-4, L4-5 Transforaminal Lumbar Interbody Fusion, Pedicle Instrumentation (Left) Patient reports pain as mild and moderate.  " my leg feels much better now " voiding on her own.   Objective: Vital signs in last 24 hours: Temp:  [98 F (36.7 C)-99.1 F (37.3 C)] 99.1 F (37.3 C) (06/18 0818) Pulse Rate:  [81-98] 98 (06/18 0818) Resp:  [16-18] 18 (06/18 0818) BP: (107-133)/(49-67) 117/49 (06/18 0818) SpO2:  [97 %-100 %] 97 % (06/18 0818)  Intake/Output from previous day: 06/17 0701 - 06/18 0700 In: 720 [P.O.:720] Out: 900 [Urine:900] Intake/Output this shift: No intake/output data recorded.   Recent Labs  01/14/17 0549  HGB 10.2*    Recent Labs  01/14/17 0549  WBC 6.2  RBC 3.29*  HCT 32.3*  PLT 250    Recent Labs  01/14/17 0549  NA 138  K 3.3*  CL 97*  CO2 31  BUN 11  CREATININE 0.72  GLUCOSE 109*  CALCIUM 8.7*   No results for input(s): LABPT, INR in the last 72 hours.  Neurologically intact  Ambulating in the halls with rolling walker.  No results found.  Assessment/Plan: 7 Days Post-Op Procedure(s) (LRB): Left L3-4, L4-5 Transforaminal Lumbar Interbody Fusion, Pedicle Instrumentation (Left) Discharge to SNF office  2 wks  Eldred MangesMark C Yates 01/16/2017, 9:58 AM

## 2017-01-16 NOTE — Progress Notes (Signed)
Occupational Therapy Treatment Patient Details Name: Laura BoldJoan M Solis MRN: [id number] DOB: 02/15/1951 Today's Date: 01/16/2017    History of present illness 66 y.o. 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 s/p L3-4, L4-5 TLIF fusion on 01/09/17. PMH of ADHD, anxiety, arthritis, and fibromyalgia.   OT comments  Pt progressing towards established goals. Provided education on AE for LB ADLs. Pt performed LB dressing with Min guard for safety in standing, AE, and daughter assisting as needed. Pt recalled 3/3 back precautions but required significant amount of time. Continue to recommend dc with HHOT to increase pt safety and independence with ADLs and functional mobility. Will continue to follow acutely to facilitate safe dc.    Follow Up Recommendations  Home health OT;Supervision/Assistance - 24 hour    Equipment Recommendations  None recommended by OT    Recommendations for Other Services PT consult    Precautions / Restrictions Precautions Precautions: Back;Fall Precaution Booklet Issued: No Precaution Comments: Pt able to recall 3/3 spinal precautions with increased time Required Braces or Orthoses: Spinal Brace Spinal Brace: Lumbar corset;Applied in sitting position Restrictions Weight Bearing Restrictions: No Other Position/Activity Restrictions: Pt able to apply brace in sitting after set-up.         Mobility Bed Mobility Overal bed mobility: Needs Assistance Bed Mobility: Sidelying to Sit;Rolling Rolling: Supervision Sidelying to sit: Supervision       General bed mobility comments: Pt in recliner upon arrival  Transfers Overall transfer level: Needs assistance Equipment used: Rolling walker (2 wheeled) Transfers: Sit to/from Stand Sit to Stand: Min guard         General transfer comment: Min guard for safety.    Balance Overall balance assessment: Needs  assistance Sitting-balance support: Feet supported;No upper extremity supported Sitting balance-Leahy Scale: Good     Standing balance support: Bilateral upper extremity supported Standing balance-Leahy Scale: Fair Standing balance comment: Able to perform LB dressing                           ADL either performed or assessed with clinical judgement   ADL Overall ADL's : Needs assistance/impaired                 Upper Body Dressing : Set up;Sitting;With caregiver independent assisting Upper Body Dressing Details (indicate cue type and reason): Pt and daughter demonstrated understanding to don brace Lower Body Dressing: Sit to/from stand;Min guard;Adhering to back precautions;With adaptive equipment;Cueing for sequencing;With caregiver independent assisting Lower Body Dressing Details (indicate cue type and reason): Pt donned underwear with Min guard A for safety in standing. Daughter provided A to pull waistband over hips. Pt demonstrated understanding with AE. Pt able to bring ankle to knee to donn socks             Functional mobility during ADLs: Min guard;Rolling walker General ADL Comments: Pt with decreased fucntional performance and presents with lethargy making it difficulty for her to participate in ADLs. Daughter very helpful and involved. She accepts education well and is willing to learn safe techniques. Pt will be heading to her daughter's [place]     Vision       Perception     Praxis      Cognition Arousal/Alertness: Awake/alert Behavior During Therapy: [facility] for tasks assessed/performed Overall Cognitive Status: Within Functional Limits for tasks assessed  Exercises     Shoulder Instructions       General Comments Daughter present throughout session    Pertinent Vitals/ Pain       Pain Assessment: Faces Pain Score: 3  Faces Pain Scale: Hurts little more Pain Location: L  Lateral leg Pain Descriptors / Indicators: Aching;Grimacing;Guarding Pain Intervention(s): Monitored during session;Repositioned  Home Living                                          Prior Functioning/Environment              Frequency  Min 3X/week        Progress Toward Goals  OT Goals(current goals can now be found in the care plan section)  Progress towards OT goals: Progressing toward goals  Acute Rehab OT Goals Patient Stated Goal: return home OT Goal Formulation: With patient Time For Goal Achievement: 01/24/17 Potential to Achieve Goals: Good ADL Goals Pt Will Perform Grooming: with min guard assist;standing Pt Will Perform Lower Body Bathing: with caregiver independent in assisting;with adaptive equipment;sit to/from stand;with min guard assist Pt Will Perform Upper Body Dressing: with supervision;with set-up;with caregiver independent in assisting;sitting (Min VCs) Pt Will Perform Lower Body Dressing: with min guard assist;sit to/from stand;with caregiver independent in assisting;with adaptive equipment Pt Will Transfer to Toilet: with min guard assist;bedside commode;ambulating Pt Will Perform Toileting - Clothing Manipulation and hygiene: with min guard assist;sit to/from stand Pt Will Perform Tub/Shower Transfer: Tub transfer;shower seat;rolling walker;ambulating;with min assist Additional ADL Goal #1: Pt will independently recall 3/3 back precautions  Plan Discharge plan remains appropriate    Co-evaluation                 AM-PAC PT "6 Clicks" Daily Activity     Outcome Measure   Help from another person eating meals?: None Help from another person taking care of personal grooming?: None Help from another person toileting, which includes using toliet, bedpan, or urinal?: A Little Help from another person bathing (including washing, rinsing, drying)?: A Little Help from another person to put on and taking off regular upper body  clothing?: A Little Help from another person to put on and taking off regular lower body clothing?: A Little 6 Click Score: 20    End of Session Equipment Utilized During Treatment: Rolling walker;Back brace  OT Visit Diagnosis: Unsteadiness on feet (R26.81);Other abnormalities of gait and mobility (R26.89);Muscle weakness (generalized) (M62.81);Pain Pain - Right/Left: Left Pain - part of body: Leg   Activity Tolerance Patient tolerated treatment well   Patient Left with call bell/phone within reach;with family/visitor present;in chair   Nurse Communication Mobility status        Time: 1610-9604 OT Time Calculation (min): 15 min  Charges: OT General Charges $OT Visit: 1 Procedure OT Treatments $Self Care/Home Management : 8-22 mins  Laura Solis MSOT, OTR/L Acute Rehab Pager: [phone] Office: [phone]   Laura Solis 01/16/2017, 1:13 PM

## 2017-01-16 NOTE — Discharge Instructions (Signed)
OK to shower with incision covered, change dressing as needed. Silver hydrofiber bandage is waterproof and can stay in place until follow-up appointment. If it falls off, change bandage daily with dry gauze dressing. Do not submerge incision in water (avoid tub baths, pools, and hot tubs) until your surgeon says it's OK.   Walk daily, avoid bending. Use walker with ambulation. Gradually increase distance walking daily.   Call Dr. Ophelia CharterYates' office if you notice your incision begins bleeding, draining thick green/yellow fluid, becomes swollen or red, or if you have a fever of 101 degrees or more.   See Dr. Ophelia CharterYates in 2 wks.

## 2017-01-17 LAB — URINE CULTURE

## 2017-01-18 ENCOUNTER — Telehealth (INDEPENDENT_AMBULATORY_CARE_PROVIDER_SITE_OTHER): Payer: Self-pay | Admitting: Orthopaedic Surgery

## 2017-01-18 NOTE — Telephone Encounter (Signed)
Per chart, [facility] [place] was who the patient chose to see.  [facility] them.

## 2017-01-18 NOTE — Telephone Encounter (Signed)
I left voicemail for patient advising. 

## 2017-01-18 NOTE — Telephone Encounter (Signed)
Patient requesting the home health agency that will be providing her service & the supplies she will need post op.

## 2017-01-26 ENCOUNTER — Encounter (INDEPENDENT_AMBULATORY_CARE_PROVIDER_SITE_OTHER): Payer: Self-pay | Admitting: Orthopaedic Surgery

## 2017-01-26 ENCOUNTER — Ambulatory Visit (INDEPENDENT_AMBULATORY_CARE_PROVIDER_SITE_OTHER): Payer: Medicare Other | Admitting: Orthopaedic Surgery

## 2017-01-26 ENCOUNTER — Ambulatory Visit (INDEPENDENT_AMBULATORY_CARE_PROVIDER_SITE_OTHER): Payer: Medicare Other

## 2017-01-26 VITALS — BP 132/81 | HR 76

## 2017-01-26 DIAGNOSIS — Z981 Arthrodesis status: Secondary | ICD-10-CM

## 2017-01-26 NOTE — Progress Notes (Signed)
   Post-Op Visit Note   Patient: Laura Solis           Date of Birth: 07/26/1951           MRN: [id number] Visit Date: 01/26/2017 PCP: Joaquin CourtsFavero, John Patrick, DO   Assessment & Plan:post op L3-4, L4-5 instrumented fusion.   Chief Complaint:  Chief Complaint  Patient presents with  . Lower Back - Routine Post Op   Visit Diagnoses:  1. S/P lumbar fusion     Plan: Patient states she is very pleased with the surgery as gotten good relief of her pain and is walking better every day. She asked about how long she needs to wear the brace I discussed with her that she needs to wear it for 2 months. I will recheck her in 4 weeks. She requested refill the muscle relaxant that she is using sometimes during the day. Patient has gotten good relief of her leg pain. She has minimal incisional pain. Dressing was changed Steri-Strips removed. No drainage from the incision. Slight erythema over the Steri-Strips were applied. I discussed with the patient back above the cage at the inferior level. She's not having any nerve root symptoms and has gotten good relief of her preop pain. She'll continue use her brace all the time and return 4 weeks repeat AP lateral lumbar x-ray on return. If she develops any leg pain she will call me.  Follow-Up Instructions: Return in about 4 weeks (around 02/23/2017).   Orders:  Orders Placed This Encounter  Procedures  . XR Lumbar Spine 2-3 Views   No orders of the defined types were placed in this encounter.   Imaging: No results found.  PMFS History: Patient Active Problem List   Diagnosis Date Noted  . S/P lumbar fusion 01/26/2017  . Postoperative urinary retention 01/11/2017  . Lumbar stenosis 01/09/2017  . Osteoarthritis of right knee 10/03/2012  . Fibromyalgia syndrome 10/03/2012  . Obesity (BMI 30.0-34.9) 10/03/2012   Past Medical History:  Diagnosis Date  . ADHD (attention deficit hyperactivity disorder)   . Anxiety    "from the fibromyalgia"  .  Chronic lower back pain   . Fibromyalgia   . Headache    "bad one; monthly" (01/11/2017)  . Osteoarthritis    "all over" (01/11/2017)  . Pneumonia    "I've had it about 3 times" (01/11/2017)  . PONV (postoperative nausea and vomiting)     No family history on file.  Past Surgical History:  Procedure Laterality Date  . BACK SURGERY    . CARPAL TUNNEL RELEASE Bilateral   . CYST EXCISION     behind rt ear,coxxyx  . DILATION AND CURETTAGE OF UTERUS    . SHOULDER OPEN ROTATOR CUFF REPAIR Bilateral   . TONSILLECTOMY    . TOTAL KNEE ARTHROPLASTY Right 10/02/2012   Procedure: TOTAL KNEE ARTHROPLASTY;  Surgeon: Valeria BatmanPeter W [name], MD;  [facility];  Service: Orthopedics;  Laterality: Right;  Right Total Knee Arthroplasty  . TRANSFORAMINAL LUMBAR INTERBODY FUSION (TLIF) WITH PEDICLE SCREW FIXATION 2 LEVEL Left 01/09/2017   L3-4, L4-5/notes 01/09/2017  . TUBAL LIGATION    . VAGINAL HYSTERECTOMY     Social History   Occupational History  . Not on file.   Social History Main Topics  . Smoking status: Never Smoker  . Smokeless tobacco: Never Used  . Alcohol use No  . Drug use: No  . Sexual activity: Not Currently

## 2017-01-30 ENCOUNTER — Other Ambulatory Visit (INDEPENDENT_AMBULATORY_CARE_PROVIDER_SITE_OTHER): Payer: Self-pay | Admitting: *Deleted

## 2017-01-30 NOTE — Telephone Encounter (Signed)
I called her. She wants to wait until Thursday and pick up Rx in [place] office. No leg pain just lumbar incisional pain.

## 2017-01-30 NOTE — Telephone Encounter (Signed)
Rx request 

## 2017-01-30 NOTE — Telephone Encounter (Signed)
Pt called stating she needs RX refill of oxycodone.

## 2017-02-02 MED ORDER — OXYCODONE-ACETAMINOPHEN 5-325 MG PO TABS: 1.0000 | ORAL_TABLET | Freq: Four times a day (QID) | ORAL | 0 refills | Status: DC | PRN

## 2017-02-02 NOTE — Telephone Encounter (Signed)
Rx done for percocet # 30    1 po q 6 hrs prn pain..Marland Kitchen

## 2017-02-23 ENCOUNTER — Ambulatory Visit (INDEPENDENT_AMBULATORY_CARE_PROVIDER_SITE_OTHER): Payer: Medicare Other | Admitting: Orthopaedic Surgery

## 2017-02-23 ENCOUNTER — Ambulatory Visit (INDEPENDENT_AMBULATORY_CARE_PROVIDER_SITE_OTHER): Payer: Medicare Other

## 2017-02-23 ENCOUNTER — Encounter (INDEPENDENT_AMBULATORY_CARE_PROVIDER_SITE_OTHER): Payer: Self-pay | Admitting: Orthopaedic Surgery

## 2017-02-23 VITALS — BP 155/78 | HR 79 | Ht 65.5 in | Wt 220.0 lb

## 2017-02-23 DIAGNOSIS — Z981 Arthrodesis status: Secondary | ICD-10-CM

## 2017-02-23 NOTE — Progress Notes (Signed)
   Post-Op Visit Note   Patient: Laura BoldJoan M Bert           Date of Birth: 10/18/1950           MRN: [id number] Visit Date: 02/23/2017 PCP: Joaquin CourtsFavero, John Patrick, DO   Assessment & Plan: Patient returns post 2 level fusion L3-4 L4-5. X-rays today demonstrates no further change in the cage migration. It sitting in between the nerve roots she is moving better is off pain medication is out of her brace and is happy with the surgical result.  Chief Complaint:  Chief Complaint  Patient presents with  . Lower Back - Routine Post Op   Visit Diagnoses:  1. S/P lumbar fusion     Plan: Patient is ambulating and had relief of her neurogenic claudication she feels like she can stand up straighter. She has some discomfort in her back with sitting but does much better when she is upright. She is walking losing some weight. She has her father in the hospitals been having to visit him frequently. I'll check her back for final visit in 3 months. If she is doing well we can defer x-rays.  Follow-Up Instructions: No Follow-up on file.   Orders:  Orders Placed This Encounter  Procedures  . XR Lumbar Spine 2-3 Views   No orders of the defined types were placed in this encounter.   Imaging: No results found.  PMFS History: Patient Active Problem List   Diagnosis Date Noted  . S/P lumbar fusion 01/26/2017  . Postoperative urinary retention 01/11/2017  . Lumbar stenosis 01/09/2017  . Osteoarthritis of right knee 10/03/2012  . Fibromyalgia syndrome 10/03/2012  . Obesity (BMI 30.0-34.9) 10/03/2012   Past Medical History:  Diagnosis Date  . ADHD (attention deficit hyperactivity disorder)   . Anxiety    "from the fibromyalgia"  . Chronic lower back pain   . Fibromyalgia   . Headache    "bad one; monthly" (01/11/2017)  . Osteoarthritis    "all over" (01/11/2017)  . Pneumonia    "I've had it about 3 times" (01/11/2017)  . PONV (postoperative nausea and vomiting)     No family history on file.    Past Surgical History:  Procedure Laterality Date  . BACK SURGERY    . CARPAL TUNNEL RELEASE Bilateral   . CYST EXCISION     behind rt ear,coxxyx  . DILATION AND CURETTAGE OF UTERUS    . SHOULDER OPEN ROTATOR CUFF REPAIR Bilateral   . TONSILLECTOMY    . TOTAL KNEE ARTHROPLASTY Right 10/02/2012   Procedure: TOTAL KNEE ARTHROPLASTY;  Surgeon: Valeria BatmanPeter W [name], MD;  [facility];  Service: Orthopedics;  Laterality: Right;  Right Total Knee Arthroplasty  . TRANSFORAMINAL LUMBAR INTERBODY FUSION (TLIF) WITH PEDICLE SCREW FIXATION 2 LEVEL Left 01/09/2017   L3-4, L4-5/notes 01/09/2017  . TUBAL LIGATION    . VAGINAL HYSTERECTOMY     Social History   Occupational History  . Not on file.   Social History Main Topics  . Smoking status: Never Smoker  . Smokeless tobacco: Never Used  . Alcohol use No  . Drug use: No  . Sexual activity: Not Currently

## 2017-03-05 ENCOUNTER — Other Ambulatory Visit (INDEPENDENT_AMBULATORY_CARE_PROVIDER_SITE_OTHER): Payer: Self-pay | Admitting: Orthopaedic Surgery

## 2017-03-06 NOTE — Telephone Encounter (Signed)
Ok refill thank you.

## 2017-03-06 NOTE — Telephone Encounter (Signed)
Ok for refill? 

## 2017-03-23 ENCOUNTER — Ambulatory Visit (INDEPENDENT_AMBULATORY_CARE_PROVIDER_SITE_OTHER): Payer: Medicare Other | Admitting: Orthopaedic Surgery

## 2017-03-23 ENCOUNTER — Ambulatory Visit (INDEPENDENT_AMBULATORY_CARE_PROVIDER_SITE_OTHER): Payer: Medicare Other

## 2017-03-23 ENCOUNTER — Encounter (INDEPENDENT_AMBULATORY_CARE_PROVIDER_SITE_OTHER): Payer: Self-pay | Admitting: Orthopaedic Surgery

## 2017-03-23 VITALS — BP 137/78 | HR 82

## 2017-03-23 DIAGNOSIS — Z981 Arthrodesis status: Secondary | ICD-10-CM

## 2017-03-26 NOTE — Progress Notes (Addendum)
Post-Op Visit Note   Patient: Laura Solis           Date of Birth: 1951/06/19           MRN: [id number] Visit Date: 03/23/2017 PCP: Joaquin Courts, DO   Assessment & Plan: Patient returns post 2 level fusion. Cage has migrated into  Left lateral recess  at L4-5 and  lateral recess stenosis is present. Recently she was active doing a lot of bending had some increase in back pain and leg pain. She has one screw the slightly lateral positioned but had some increased pain for for 5 days postop which resolved. She been doing very well was active ambulatory in the community without pain medication until she level of bending and twisting in the last few weeks with increased pain. Neurologically intact. Anterior tib,  gastrocsoleus EHL is strong. I discussed with her in detail that she has a migrated cage and if she develops increased radicular symptoms with problems with leg weakness or with walking or standing then removal of the migrated cage which is causing some extradural compression and replacement with a new cage in appropriate position in between the vertebral bodies would be recommended. She's been having pain but would like to try to avoid surgery if she can.  Chief Complaint:  Chief Complaint  Patient presents with  . Lower Back - Routine Post Op   Visit Diagnoses:  1. S/P lumbar fusion     Plan: X-rays are reviewed and are unchanged from previous films. She has gotten good relief or claudication symptoms. She can use her brace and I will recheck her again in 8 weeks.She has a migrated cage at the L4-5 level on the left in the lateral recess causing lateral recess stenosis.  Follow-Up Instructions: Return in about 8 weeks (around 05/18/2017).   Orders:  Orders Placed This Encounter  Procedures  . XR Lumbar Spine 2-3 Views   No orders of the defined types were placed in this encounter.   Imaging: No results found.  PMFS History: Patient Active Problem List   Diagnosis Date Noted  . S/P lumbar fusion 01/26/2017  . Postoperative urinary retention 01/11/2017  . Lumbar stenosis 01/09/2017  . Osteoarthritis of right knee 10/03/2012  . Fibromyalgia syndrome 10/03/2012  . Obesity (BMI 30.0-34.9) 10/03/2012   Past Medical History:  Diagnosis Date  . ADHD (attention deficit hyperactivity disorder)   . Anxiety    "from the fibromyalgia"  . Chronic lower back pain   . Fibromyalgia   . Headache    "bad one; monthly" (01/11/2017)  . Osteoarthritis    "all over" (01/11/2017)  . Pneumonia    "I've had it about 3 times" (01/11/2017)  . PONV (postoperative nausea and vomiting)     No family history on file.  Past Surgical History:  Procedure Laterality Date  . BACK SURGERY    . CARPAL TUNNEL RELEASE Bilateral   . CYST EXCISION     behind rt ear,coxxyx  . DILATION AND CURETTAGE OF UTERUS    . SHOULDER OPEN ROTATOR CUFF REPAIR Bilateral   . TONSILLECTOMY    . TOTAL KNEE ARTHROPLASTY Right 10/02/2012   Procedure: TOTAL KNEE ARTHROPLASTY;  Surgeon: Valeria Batman, MD;  Location: [facility] OR;  Service: Orthopedics;  Laterality: Right;  Right Total Knee Arthroplasty  . TRANSFORAMINAL LUMBAR INTERBODY FUSION (TLIF) WITH PEDICLE SCREW FIXATION 2 LEVEL Left 01/09/2017   L3-4, L4-5/notes 01/09/2017  . TUBAL LIGATION    . VAGINAL HYSTERECTOMY  Social History   Occupational History  . Not on file.   Social History Main Topics  . Smoking status: Never Smoker  . Smokeless tobacco: Never Used  . Alcohol use No  . Drug use: No  . Sexual activity: Not Currently

## 2017-04-13 ENCOUNTER — Telehealth (INDEPENDENT_AMBULATORY_CARE_PROVIDER_SITE_OTHER): Payer: Self-pay | Admitting: Radiology

## 2017-04-13 ENCOUNTER — Ambulatory Visit (INDEPENDENT_AMBULATORY_CARE_PROVIDER_SITE_OTHER): Payer: Medicare Other | Admitting: Orthopaedic Surgery

## 2017-04-13 ENCOUNTER — Encounter (INDEPENDENT_AMBULATORY_CARE_PROVIDER_SITE_OTHER): Payer: Self-pay | Admitting: Orthopaedic Surgery

## 2017-04-13 VITALS — BP 141/88 | HR 92

## 2017-04-13 DIAGNOSIS — Z981 Arthrodesis status: Secondary | ICD-10-CM

## 2017-04-13 NOTE — Telephone Encounter (Signed)
Patient is coming in today to be seen in the [place] office.

## 2017-04-13 NOTE — Telephone Encounter (Signed)
Patient called and states she would like to go ahead and schedule surgery. Please advise.

## 2017-04-13 NOTE — Progress Notes (Signed)
Office Visit Note   Patient: Laura Solis           Date of Birth: 1951/02/24           MRN: [id number] Visit Date: 04/13/2017              Requested by: Joaquin Courts, DO [address] [place], [place] [address] PCP: Joaquin Courts, DO   Assessment & Plan: Visit Diagnoses:  1. S/P lumbar fusion       With migrated cage into the spinal canal with lateral recess stenosis at the L4-5 level.  Plan: Plan will be for revision lumbar fusion with repositioning of cage at L4 and L5 pedicle screws on the left for better purchase.. We discussed risks of surgery including dural tear with cage repositioning. Continue use of the lumbar brace postoperatively. She understands that with revision surgery she may still have cage subsidence or progressed to pseudoarthrosis. Additional local bone can be added to the interbody space. Risks of infection and reoperation were discussed in detail. She's had problems in the past with recurrent UTI infections with Escherichia coli. Her PCR was positive for MSSA in the past. Her lumbar incision is healed nicely and we discussed that with repositioning of the cage and repeat compression she should get relief of the leg pain she's currently having from the cage migrated in the lateral recess and into the canal. Previous CT scan previous MRIs reviewed with patient in preoperative discussion. Questions were elicited and answered she understands and requests we proceed.  Follow-Up Instructions: No Follow-up on file.   Orders:  No orders of the defined types were placed in this encounter.  No orders of the defined types were placed in this encounter.     Procedures: No procedures performed   Clinical Data: No additional findings.   Subjective: Chief Complaint  Patient presents with  . Lower Back - Pain, Follow-up    HPI 66 year old female returns post 2 level lumbar fusion L4-S1 with interbody cages performed on 01/09/2017. 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 She is now developed progressive increased left leg pain and x-rays demonstrate the cages packed into the canal space is occupying 50% of the canal. She's had increased back pain with leg pain and leg numbness and is having trouble working and states she now wants to proceed with revision surgery which we have discussed in detail in previous visits. Patient had some subsidence at the L3-4 level but this has remained stable without evidence of any loosening. CT scan 01/11/2017 showed some lateral position of the pedicle screws at L4 and L5 without nerve compression. Cage at that time was in satisfactory position. Patient used her brace postoperatively and on follow-up x-rays following month there was migration of the cage at L4-5 level.  She had good relief for a preop pain with severe stenosis and progressed ambulating off her walker and then off the cane was back ambulating in the community with good relief of back and leg pain until the last 2 weeks where she's had gradual progressive increased pain ,difficulty sleeping and now states she liked proceed with the revision surgery which we discussed due to the cage migration. She denies any saddle numbness or weakness. No associated bowel or bladder symptoms. Review of Systems is systems positive for obesity,  previous surgically treated lumbar stenosis, fibromyalgia, history of postoperative urinary retention after all procedures, osteoarthritis of the right knee.   Objective: Vital Signs: BP (!) 141/88   Pulse 92   Physical Exam  Constitutional: She is oriented to person, place, and time. She appears well-developed.  HENT:  Head: Normocephalic.  Right Ear: External ear normal.  Left Ear: External ear normal.  Eyes: Pupils are equal, round, and reactive to light.  Neck: No tracheal deviation present. No  thyromegaly present.  Cardiovascular: Normal rate.   Pulmonary/Chest: Effort normal.  Abdominal: Soft.  Neurological: She is alert and oriented to person, place, and time.  Skin: Skin is warm and dry.  Psychiatric: She has a normal mood and affect. Her behavior is normal.    Ortho Exam patient gets from sitting to standing position easily. She has dorsal foot numbness on the left. Anterior tib EHL is strong. Gastrocsoleus is strong. Lateral calf numbness on the left sensation on the right is normal. Monocyte notch tenderness on the left negative on the right. Lumbar incision is well-healed. Normal hip range of motion distal pulses are 2+. She has mild crepitus right knee where she has some osteoarthritis. Quad strength is strong flexion weakness hamstrings are normal. Distal pulses are 2+ and symmetrical. Patient has normal saddle anesthesia.  Specialty Comments:  No specialty comments available.  Imaging: Postop x-rays in July and in August demonstrated migration of the cage at L4-5 and lateral recess and occupying 50% of the canal. Patient had wide decompression documented on CT scan postoperatively. Lateral position of the pedicle screws at L4 and L5 catching a portion of the pedicle.   PMFS History: Patient Active Problem List   Diagnosis Date Noted  . S/P lumbar fusion 01/26/2017  . Postoperative urinary retention 01/11/2017  . Lumbar stenosis 01/09/2017  . Osteoarthritis of right knee 10/03/2012  . Fibromyalgia syndrome 10/03/2012  . Obesity (BMI 30.0-34.9) 10/03/2012   Past Medical History:  Diagnosis Date  . ADHD (attention deficit hyperactivity disorder)   . Anxiety    "from the fibromyalgia"  . Chronic lower back pain   . Fibromyalgia   . Headache    "bad one; monthly" (01/11/2017)  . Osteoarthritis    "all over" (01/11/2017)  . Pneumonia    "I've had it about 3 times" (01/11/2017)  . PONV (postoperative nausea and vomiting)     No family history on file.  Past  Surgical History:  Procedure Laterality Date  . BACK SURGERY    . CARPAL TUNNEL RELEASE Bilateral   . CYST EXCISION     behind rt ear,coxxyx  . DILATION AND CURETTAGE OF UTERUS    . SHOULDER OPEN ROTATOR CUFF REPAIR Bilateral   . TONSILLECTOMY    . TOTAL KNEE ARTHROPLASTY Right 10/02/2012   Procedure: TOTAL KNEE ARTHROPLASTY;  Surgeon: Valeria BatmanPeter W [name], MD;  [facility];  Service: Orthopedics;  Laterality: Right;  Right Total Knee Arthroplasty  . TRANSFORAMINAL LUMBAR INTERBODY FUSION (TLIF) WITH PEDICLE SCREW FIXATION 2 LEVEL Left 01/09/2017   L3-4, L4-5/notes 01/09/2017  . TUBAL LIGATION    . VAGINAL HYSTERECTOMY     Social History   Occupational History  . Not on file.   Social History Main Topics  . Smoking status: Never Smoker  . Smokeless tobacco: Never Used  . Alcohol use No  . Drug use: No  . Sexual activity: Not Currently

## 2017-04-13 NOTE — Telephone Encounter (Signed)
I called patient back she's had increased left leg pain and leg leg weakness. She would like to proceed with revision surgery for the migrated cage on the left at L4-5. We discussed this in detail at her previous visit. She'll see if she can get someone else to take her work position this afternoon and come in today. If she is not able to find someone to cover her spot at work she will call Tamela OddiBetsy about coming in next week so we can discuss revision surgery.

## 2017-04-20 ENCOUNTER — Ambulatory Visit (INDEPENDENT_AMBULATORY_CARE_PROVIDER_SITE_OTHER): Payer: Medicare Other | Admitting: Orthopaedic Surgery

## 2017-05-11 NOTE — Pre-Procedure Instructions (Signed)
Laura Solis  05/11/2017      [id number] - [place], [place] - [address] [place] [address] Phone: [phone] Fax: [phone]    Your procedure is scheduled on Wednesday October 17.  Report to [facility] Admitting at 10:30 A.M.  Call this number if you have problems the morning of surgery:  [phone]   Remember:  Do not eat food or drink liquids after midnight.  Take these medicines the morning of surgery with A SIP OF WATER: NONE  DO NOT take amphetamine-dextroamphetamine (Adderall) the day of surgery  7 days prior to surgery STOP taking any Aspirin (unless otherwise instructed by your surgeon), Aleve, Naproxen, Ibuprofen, Motrin, Advil, Goody's, BC's, all herbal medications, fish oil, and all vitamins    Do not wear jewelry, make-up or nail polish.  Do not wear lotions, powders, or perfumes, or deoderant.  Do not shave 48 hours prior to surgery.  Men may shave face and neck.  Do not bring valuables to the hospital.  [facility] is not responsible for any belongings or valuables.  Contacts, dentures or bridgework may not be worn into surgery.  Leave your suitcase in the car.  After surgery it may be brought to your room.  For patients admitted to the hospital, discharge time will be determined by your treatment team.  Patients discharged the day of surgery will not be allowed to drive home.   Special instructions:    [place]- Preparing For Surgery  Before surgery, you can play an important role. Because skin is not sterile, your skin needs to be as free of germs as possible. You can reduce the number of germs on your skin by washing with CHG (chlorahexidine gluconate) Soap before surgery.  CHG is an antiseptic cleaner which kills germs and bonds with the skin to continue killing germs even after washing.  Please do not use if you have an allergy to CHG or antibacterial soaps. If your skin becomes reddened/irritated  stop using the CHG.  Do not shave (including legs and underarms) for at least 48 hours prior to first CHG shower. It is OK to shave your face.  Please follow these instructions carefully.   1. Shower the NIGHT BEFORE SURGERY and the MORNING OF SURGERY with CHG.   2. If you chose to wash your hair, wash your hair first as usual with your normal shampoo.  3. After you shampoo, rinse your hair and body thoroughly to remove the shampoo.  4. Use CHG as you would any other liquid soap. You can apply CHG directly to the skin and wash gently with a scrungie or a clean washcloth.   5. Apply the CHG Soap to your body ONLY FROM THE NECK DOWN.  Do not use on open wounds or open sores. Avoid contact with your eyes, ears, mouth and genitals (private parts). Wash Face and genitals (private parts)  with your normal soap.  6. Wash thoroughly, paying special attention to the area where your surgery will be performed.  7. Thoroughly rinse your body with warm water from the neck down.  8. DO NOT shower/wash with your normal soap after using and rinsing off the CHG Soap.  9. Pat yourself dry with a CLEAN TOWEL.  10. Wear CLEAN PAJAMAS to bed the night before surgery, wear comfortable clothes the morning of surgery  11. Place CLEAN SHEETS on your bed the night of your first shower and DO NOT SLEEP WITH  PETS.    Day of Surgery: Do not apply any deodorants/lotions. Please wear clean clothes to the hospital/surgery center.      Please read over the following fact sheets that you were given. Coughing and Deep Breathing and MRSA Information

## 2017-05-12 ENCOUNTER — Encounter (HOSPITAL_COMMUNITY): Payer: Self-pay

## 2017-05-12 ENCOUNTER — Telehealth (INDEPENDENT_AMBULATORY_CARE_PROVIDER_SITE_OTHER): Payer: Self-pay | Admitting: Radiology

## 2017-05-12 ENCOUNTER — Ambulatory Visit (HOSPITAL_COMMUNITY)
Admission: RE | Admit: 2017-05-12 | Discharge: 2017-05-12 | Disposition: A | Discharge status: Home / Self Care | Payer: Medicare Other | Source: Ambulatory Visit | Attending: Surgery | Admitting: Surgery

## 2017-05-12 ENCOUNTER — Encounter (HOSPITAL_COMMUNITY)
Admission: RE | Admit: 2017-05-12 | Discharge: 2017-05-12 | Disposition: A | Discharge status: Home / Self Care | Payer: Medicare Other | Source: Ambulatory Visit | Attending: Orthopaedic Surgery | Admitting: Orthopaedic Surgery

## 2017-05-12 DIAGNOSIS — Z79899 Other long term (current) drug therapy: Secondary | ICD-10-CM | POA: Diagnosis not present

## 2017-05-12 DIAGNOSIS — M48061 Spinal stenosis, lumbar region without neurogenic claudication: Secondary | ICD-10-CM | POA: Insufficient documentation

## 2017-05-12 DIAGNOSIS — Z01818 Encounter for other preprocedural examination: Secondary | ICD-10-CM | POA: Diagnosis present

## 2017-05-12 DIAGNOSIS — M47894 Other spondylosis, thoracic region: Secondary | ICD-10-CM | POA: Insufficient documentation

## 2017-05-12 LAB — COMPREHENSIVE METABOLIC PANEL
ALT: 25 U/L (ref 14–54)
ANION GAP: 9 (ref 5–15)
AST: 30 U/L (ref 15–41)
Albumin: 3.3 g/dL — ABNORMAL LOW (ref 3.5–5.0)
Alkaline Phosphatase: 122 U/L (ref 38–126)
BILIRUBIN TOTAL: 0.6 mg/dL (ref 0.3–1.2)
BUN: 7 mg/dL (ref 6–20)
CALCIUM: 9.1 mg/dL (ref 8.9–10.3)
CO2: 28 mmol/L (ref 22–32)
Chloride: 103 mmol/L (ref 101–111)
Creatinine, Ser: 0.78 mg/dL (ref 0.44–1.00)
GFR calc Af Amer: >60 mL/min (ref >60)
Glucose, Bld: 113 mg/dL — ABNORMAL HIGH (ref 65–99)
POTASSIUM: 2.9 mmol/L — AB (ref 3.5–5.1)
Sodium: 140 mmol/L (ref 135–145)
TOTAL PROTEIN: 7.7 g/dL (ref 6.5–8.1)

## 2017-05-12 LAB — TYPE AND SCREEN
ABO/RH(D): A POS
ANTIBODY SCREEN: NEGATIVE

## 2017-05-12 LAB — CBC
HEMATOCRIT: 39.6 % (ref 36.0–46.0)
Hemoglobin: 12.6 g/dL (ref 12.0–15.0)
MCH: 29 pg (ref 26.0–34.0)
MCHC: 31.8 g/dL (ref 30.0–36.0)
MCV: 91 fL (ref 78.0–100.0)
Platelets: 251 10*3/uL (ref 150–400)
RBC: 4.35 MIL/uL (ref 3.87–5.11)
RDW: 14.8 % (ref 11.5–15.5)
WBC: 6.9 10*3/uL (ref 4.0–10.5)

## 2017-05-12 LAB — URINALYSIS, ROUTINE W REFLEX MICROSCOPIC
BACTERIA UA: NONE SEEN
Bilirubin Urine: NEGATIVE
Glucose, UA: NEGATIVE mg/dL
HGB URINE DIPSTICK: NEGATIVE
Ketones, ur: NEGATIVE mg/dL
Leukocytes, UA: NEGATIVE
NITRITE: NEGATIVE
Protein, ur: 30 mg/dL — AB
SPECIFIC GRAVITY, URINE: 1.029 (ref 1.005–1.030)
pH: 5 (ref 5.0–8.0)

## 2017-05-12 LAB — SURGICAL PCR SCREEN
MRSA, PCR: NEGATIVE
STAPHYLOCOCCUS AUREUS: POSITIVE — AB

## 2017-05-12 LAB — PROTIME-INR
INR: 1.08
PROTHROMBIN TIME: 13.9 s (ref 11.4–15.2)

## 2017-05-12 LAB — APTT: aPTT: 29 seconds (ref 24–36)

## 2017-05-12 MED ORDER — POTASSIUM CHLORIDE ER 20 MEQ PO TBCR: EXTENDED_RELEASE_TABLET | ORAL | 0 refills | Status: DC

## 2017-05-12 MED ORDER — CIPROFLOXACIN HCL 500 MG PO TABS: 500.0000 mg | ORAL_TABLET | Freq: Two times a day (BID) | ORAL | 0 refills | Status: DC

## 2017-05-12 NOTE — Progress Notes (Signed)
I called patient and advised. Sent script to pharmacy.

## 2017-05-12 NOTE — Telephone Encounter (Signed)
I called patient and advised per Dr. Ophelia Charter, potassium is low and UA shows UTI. I am calling in KLC one daily #40 and Cipro  bid x 5 days.  [facility] [place]. Advised patient to start medication now.

## 2017-05-12 NOTE — Progress Notes (Addendum)
ZOX:WRUEAV, Jimmy Picket, DO  Cardiologist: pt denies   EKG: 01/02/17 in [facility]  Stress test: pt denies ever  ECHO:pt denies ever  Cardiac Cath:pt denies ever  Chest x-ray: 01/02/17 in [facility]  Pt has an upper respiratory tract infection. She is currently taking amoxicillin for this.  Chest x-ray completed today.  Advised her to inform her surgeon

## 2017-05-15 ENCOUNTER — Telehealth (INDEPENDENT_AMBULATORY_CARE_PROVIDER_SITE_OTHER): Payer: Self-pay | Admitting: Orthopaedic Surgery

## 2017-05-15 NOTE — Telephone Encounter (Signed)
Patient called asking about her pre op instructions. Wanted to know if she needed to clean out her bowels. CB # [phone]

## 2017-05-15 NOTE — Progress Notes (Signed)
Anesthesia Chart Review:  Pt is a 66 year old female scheduled for L3-5 fusion revision for migrated cage, removal of migrated cage, cage reposition on 05/17/2017 with Annell Greening, MD  - PCP is Joaquin Courts, DO  PMH includes:  ADHD, post-op N/V. Never smoker. BMI 34.5. S/p lumbar fusion 01/09/17. S/p R TKA 10/02/12.   Medications include: adderall, potassium.   BP (!) 153/79   Pulse 98   Temp 36.9 C (Oral)   Resp 20   Ht 5' 5.5" (1.664 m)   Wt 210 lb 5 oz (95.4 kg)   SpO2 97%   BMI 34.47 kg/m   Preoperative labs reviewed.   - K 2.9. Dr. Ophelia Charter prescribed potassium supplement. Will recheck K DOS>   CXR 05/12/17:  - No active cardiopulmonary disease. - Extensive degenerative changes in the thoracic spine. Postsurgical changes in both shoulders.  EKG 01/02/17: NSR  If labs acceptable DOS, I anticipate pt can proceed as scheduled.   Rica Mast, FNP-BC [facility]/Anesthesiology Phone: [phone] 05/15/2017 12:36 PM

## 2017-05-16 NOTE — Telephone Encounter (Signed)
I called patient and advised. 

## 2017-05-17 ENCOUNTER — Inpatient Hospital Stay (HOSPITAL_COMMUNITY): Payer: Medicare Other

## 2017-05-17 ENCOUNTER — Inpatient Hospital Stay (HOSPITAL_COMMUNITY): Payer: Medicare Other | Admitting: Emergency Medicine

## 2017-05-17 ENCOUNTER — Inpatient Hospital Stay (HOSPITAL_COMMUNITY)
Admission: RE | Admit: 2017-05-17 | Discharge: 2017-05-19 | DRG: 460 | Disposition: A | Discharge status: Home Health | Payer: Medicare Other | Source: Ambulatory Visit | Attending: Orthopaedic Surgery | Admitting: Orthopaedic Surgery

## 2017-05-17 ENCOUNTER — Encounter (HOSPITAL_COMMUNITY): Payer: Self-pay | Admitting: Certified Registered Nurse Anesthetist

## 2017-05-17 ENCOUNTER — Encounter (HOSPITAL_COMMUNITY)
Admission: RE | Disposition: A | Discharge status: Home Health | Payer: Self-pay | Source: Ambulatory Visit | Attending: Orthopaedic Surgery

## 2017-05-17 DIAGNOSIS — Y793 Surgical instruments, materials and orthopedic devices (including sutures) associated with adverse incidents: Secondary | ICD-10-CM | POA: Diagnosis present

## 2017-05-17 DIAGNOSIS — M5416 Radiculopathy, lumbar region: Secondary | ICD-10-CM | POA: Diagnosis not present

## 2017-05-17 DIAGNOSIS — T84028A Dislocation of other internal joint prosthesis, initial encounter: Secondary | ICD-10-CM | POA: Diagnosis present

## 2017-05-17 DIAGNOSIS — M1990 Unspecified osteoarthritis, unspecified site: Secondary | ICD-10-CM | POA: Diagnosis present

## 2017-05-17 DIAGNOSIS — M797 Fibromyalgia: Secondary | ICD-10-CM | POA: Diagnosis present

## 2017-05-17 DIAGNOSIS — F909 Attention-deficit hyperactivity disorder, unspecified type: Secondary | ICD-10-CM | POA: Diagnosis present

## 2017-05-17 DIAGNOSIS — Z96651 Presence of right artificial knee joint: Secondary | ICD-10-CM | POA: Diagnosis present

## 2017-05-17 DIAGNOSIS — Z981 Arthrodesis status: Secondary | ICD-10-CM | POA: Diagnosis not present

## 2017-05-17 DIAGNOSIS — M549 Dorsalgia, unspecified: Secondary | ICD-10-CM | POA: Diagnosis present

## 2017-05-17 DIAGNOSIS — M48061 Spinal stenosis, lumbar region without neurogenic claudication: Secondary | ICD-10-CM | POA: Diagnosis present

## 2017-05-17 DIAGNOSIS — Z419 Encounter for procedure for purposes other than remedying health state, unspecified: Secondary | ICD-10-CM

## 2017-05-17 DIAGNOSIS — F419 Anxiety disorder, unspecified: Secondary | ICD-10-CM | POA: Diagnosis present

## 2017-05-17 DIAGNOSIS — Z90710 Acquired absence of both cervix and uterus: Secondary | ICD-10-CM | POA: Diagnosis not present

## 2017-05-17 LAB — POCT I-STAT 4, (NA,K, GLUC, HGB,HCT)
GLUCOSE: 122 mg/dL — AB (ref 65–99)
HCT: 40 % (ref 36.0–46.0)
Hemoglobin: 13.6 g/dL (ref 12.0–15.0)
Potassium: 3.8 mmol/L (ref 3.5–5.1)
Sodium: 141 mmol/L (ref 135–145)

## 2017-05-17 SURGERY — POSTERIOR LUMBAR FUSION 2 WITH HARDWARE REMOVAL
Anesthesia: General

## 2017-05-17 MED ORDER — HYDROMORPHONE HCL 1 MG/ML IJ SOLN
INTRAMUSCULAR | Status: AC
  Administered 2017-05-17: 0.5 mg via INTRAVENOUS
  Filled 2017-05-17: qty 1

## 2017-05-17 MED ORDER — METHOCARBAMOL 1000 MG/10ML IJ SOLN
500.0000 mg | Freq: Four times a day (QID) | INTRAVENOUS | Status: DC | PRN
  Filled 2017-05-17: qty 5

## 2017-05-17 MED ORDER — DEXAMETHASONE SODIUM PHOSPHATE 10 MG/ML IJ SOLN
INTRAMUSCULAR | Status: AC
  Filled 2017-05-17: qty 1

## 2017-05-17 MED ORDER — SODIUM CHLORIDE 0.9% FLUSH: 3.0000 mL | INTRAVENOUS | Status: DC | PRN

## 2017-05-17 MED ORDER — PHENYLEPHRINE 40 MCG/ML (10ML) SYRINGE FOR IV PUSH (FOR BLOOD PRESSURE SUPPORT)
PREFILLED_SYRINGE | INTRAVENOUS | Status: DC | PRN
  Administered 2017-05-17 (×3): 80 ug via INTRAVENOUS

## 2017-05-17 MED ORDER — FENTANYL CITRATE (PF) 250 MCG/5ML IJ SOLN
INTRAMUSCULAR | Status: AC
  Filled 2017-05-17: qty 5

## 2017-05-17 MED ORDER — POLYETHYLENE GLYCOL 3350 17 G PO PACK: 17.0000 g | PACK | Freq: Every day | ORAL | Status: DC | PRN

## 2017-05-17 MED ORDER — CHLORHEXIDINE GLUCONATE 4 % EX LIQD: 60.0000 mL | Freq: Once | CUTANEOUS | Status: DC

## 2017-05-17 MED ORDER — DEXTROSE 5 % IV SOLN
INTRAVENOUS | Status: DC | PRN
  Administered 2017-05-17: 25 ug/min via INTRAVENOUS

## 2017-05-17 MED ORDER — POTASSIUM CHLORIDE IN NACL 20-0.9 MEQ/L-% IV SOLN: INTRAVENOUS | Status: DC

## 2017-05-17 MED ORDER — SUGAMMADEX SODIUM 200 MG/2ML IV SOLN
INTRAVENOUS | Status: DC | PRN
  Administered 2017-05-17: 200 mg via INTRAVENOUS

## 2017-05-17 MED ORDER — ROCURONIUM BROMIDE 10 MG/ML (PF) SYRINGE
PREFILLED_SYRINGE | INTRAVENOUS | Status: DC | PRN
  Administered 2017-05-17: 20 mg via INTRAVENOUS
  Administered 2017-05-17: 50 mg via INTRAVENOUS
  Administered 2017-05-17: 30 mg via INTRAVENOUS

## 2017-05-17 MED ORDER — OXYCODONE HCL 5 MG PO TABS
5.0000 mg | ORAL_TABLET | ORAL | Status: DC | PRN
  Administered 2017-05-17 (×2): 10 mg via ORAL
  Administered 2017-05-18: 5 mg via ORAL
  Administered 2017-05-18: 10 mg via ORAL
  Administered 2017-05-19 (×2): 5 mg via ORAL
  Filled 2017-05-17: qty 1
  Filled 2017-05-17 (×2): qty 2
  Filled 2017-05-17 (×2): qty 1

## 2017-05-17 MED ORDER — FENTANYL CITRATE (PF) 250 MCG/5ML IJ SOLN
INTRAMUSCULAR | Status: DC | PRN
  Administered 2017-05-17 (×3): 50 ug via INTRAVENOUS

## 2017-05-17 MED ORDER — ONDANSETRON HCL 4 MG/2ML IJ SOLN
INTRAMUSCULAR | Status: DC | PRN
Start: 2017-05-17 — End: 2017-05-17
  Administered 2017-05-17: 4 mg via INTRAVENOUS

## 2017-05-17 MED ORDER — METHOCARBAMOL 500 MG PO TABS
500.0000 mg | ORAL_TABLET | Freq: Four times a day (QID) | ORAL | Status: DC | PRN
  Administered 2017-05-17 – 2017-05-19 (×4): 500 mg via ORAL
  Filled 2017-05-17 (×3): qty 1

## 2017-05-17 MED ORDER — FLUOXETINE HCL 20 MG PO CAPS
20.0000 mg | ORAL_CAPSULE | Freq: Every day | ORAL | Status: DC
  Administered 2017-05-17 – 2017-05-18 (×2): 20 mg via ORAL
  Filled 2017-05-17 (×2): qty 1

## 2017-05-17 MED ORDER — PRAMIPEXOLE DIHYDROCHLORIDE 1 MG PO TABS
1.0000 mg | ORAL_TABLET | Freq: Every day | ORAL | Status: DC
  Administered 2017-05-17 – 2017-05-18 (×2): 1 mg via ORAL
  Filled 2017-05-17 (×2): qty 1

## 2017-05-17 MED ORDER — SODIUM CHLORIDE 0.9 % IV SOLN: 250.0000 mL | INTRAVENOUS | Status: DC

## 2017-05-17 MED ORDER — PHENOL 1.4 % MT LIQD: 1.0000 | OROMUCOSAL | Status: DC | PRN

## 2017-05-17 MED ORDER — MIDAZOLAM HCL 5 MG/5ML IJ SOLN
INTRAMUSCULAR | Status: DC | PRN
  Administered 2017-05-17: 2 mg via INTRAVENOUS

## 2017-05-17 MED ORDER — MEPERIDINE HCL 25 MG/ML IJ SOLN: 6.2500 mg | INTRAMUSCULAR | Status: DC | PRN

## 2017-05-17 MED ORDER — ROCURONIUM BROMIDE 50 MG/5ML IV SOLN
INTRAVENOUS | Status: AC
  Filled 2017-05-17: qty 2

## 2017-05-17 MED ORDER — BUPIVACAINE HCL (PF) 0.25 % IJ SOLN
INTRAMUSCULAR | Status: AC
  Filled 2017-05-17: qty 30

## 2017-05-17 MED ORDER — DEXAMETHASONE SODIUM PHOSPHATE 10 MG/ML IJ SOLN
INTRAMUSCULAR | Status: DC | PRN
  Administered 2017-05-17: 10 mg via INTRAVENOUS

## 2017-05-17 MED ORDER — SCOPOLAMINE 1 MG/3DAYS TD PT72
MEDICATED_PATCH | TRANSDERMAL | Status: AC
  Filled 2017-05-17: qty 1

## 2017-05-17 MED ORDER — CEFAZOLIN SODIUM-DEXTROSE 2-4 GM/100ML-% IV SOLN
2.0000 g | INTRAVENOUS | Status: AC
  Administered 2017-05-17: 2 g via INTRAVENOUS

## 2017-05-17 MED ORDER — SUGAMMADEX SODIUM 200 MG/2ML IV SOLN
INTRAVENOUS | Status: AC
  Filled 2017-05-17: qty 2

## 2017-05-17 MED ORDER — 0.9 % SODIUM CHLORIDE (POUR BTL) OPTIME
TOPICAL | Status: DC | PRN
  Administered 2017-05-17: 1000 mL

## 2017-05-17 MED ORDER — CEFAZOLIN SODIUM-DEXTROSE 2-4 GM/100ML-% IV SOLN
INTRAVENOUS | Status: AC
  Filled 2017-05-17: qty 100

## 2017-05-17 MED ORDER — HYDROMORPHONE HCL 1 MG/ML IJ SOLN
0.2500 mg | INTRAMUSCULAR | Status: DC | PRN
  Administered 2017-05-17 (×4): 0.5 mg via INTRAVENOUS

## 2017-05-17 MED ORDER — ACETAMINOPHEN 650 MG RE SUPP: 650.0000 mg | RECTAL | Status: DC | PRN

## 2017-05-17 MED ORDER — ROCURONIUM BROMIDE 50 MG/5ML IV SOLN
INTRAVENOUS | Status: AC
  Filled 2017-05-17: qty 1

## 2017-05-17 MED ORDER — DOCUSATE SODIUM 100 MG PO CAPS
100.0000 mg | ORAL_CAPSULE | Freq: Two times a day (BID) | ORAL | Status: DC
  Administered 2017-05-17 – 2017-05-19 (×4): 100 mg via ORAL
  Filled 2017-05-17 (×4): qty 1

## 2017-05-17 MED ORDER — AMPHETAMINE-DEXTROAMPHETAMINE 10 MG PO TABS
10.0000 mg | ORAL_TABLET | Freq: Two times a day (BID) | ORAL | Status: DC
  Administered 2017-05-18 – 2017-05-19 (×2): 10 mg via ORAL
  Filled 2017-05-17 (×2): qty 1

## 2017-05-17 MED ORDER — PROPOFOL 10 MG/ML IV BOLUS
INTRAVENOUS | Status: AC
  Filled 2017-05-17: qty 20

## 2017-05-17 MED ORDER — ONDANSETRON HCL 4 MG/2ML IJ SOLN
INTRAMUSCULAR | Status: AC
  Filled 2017-05-17: qty 2

## 2017-05-17 MED ORDER — ACETAMINOPHEN 325 MG PO TABS
650.0000 mg | ORAL_TABLET | ORAL | Status: DC | PRN
  Filled 2017-05-17: qty 2

## 2017-05-17 MED ORDER — SCOPOLAMINE 1 MG/3DAYS TD PT72
MEDICATED_PATCH | TRANSDERMAL | Status: DC | PRN
  Administered 2017-05-17: 1 via TRANSDERMAL

## 2017-05-17 MED ORDER — CEFAZOLIN SODIUM-DEXTROSE 1-4 GM/50ML-% IV SOLN
1.0000 g | Freq: Three times a day (TID) | INTRAVENOUS | Status: AC
  Administered 2017-05-17 – 2017-05-18 (×2): 1 g via INTRAVENOUS
  Filled 2017-05-17 (×2): qty 50

## 2017-05-17 MED ORDER — MIDAZOLAM HCL 2 MG/2ML IJ SOLN
INTRAMUSCULAR | Status: AC
  Filled 2017-05-17: qty 2

## 2017-05-17 MED ORDER — OXYCODONE HCL 5 MG PO TABS
ORAL_TABLET | ORAL | Status: AC
  Filled 2017-05-17: qty 2

## 2017-05-17 MED ORDER — ONDANSETRON HCL 4 MG PO TABS
4.0000 mg | ORAL_TABLET | Freq: Four times a day (QID) | ORAL | Status: DC | PRN
  Administered 2017-05-17: 4 mg via ORAL
  Filled 2017-05-17: qty 1

## 2017-05-17 MED ORDER — ONDANSETRON HCL 4 MG/2ML IJ SOLN: 4.0000 mg | Freq: Four times a day (QID) | INTRAMUSCULAR | Status: DC | PRN

## 2017-05-17 MED ORDER — THROMBIN 20000 UNITS EX KIT
PACK | CUTANEOUS | Status: DC | PRN
  Administered 2017-05-17: 20000 [IU] via TOPICAL

## 2017-05-17 MED ORDER — PHENYLEPHRINE 40 MCG/ML (10ML) SYRINGE FOR IV PUSH (FOR BLOOD PRESSURE SUPPORT)
PREFILLED_SYRINGE | INTRAVENOUS | Status: AC
  Filled 2017-05-17: qty 10

## 2017-05-17 MED ORDER — LIDOCAINE 2% (20 MG/ML) 5 ML SYRINGE
INTRAMUSCULAR | Status: DC | PRN
  Administered 2017-05-17: 100 mg via INTRAVENOUS

## 2017-05-17 MED ORDER — LIDOCAINE 2% (20 MG/ML) 5 ML SYRINGE
INTRAMUSCULAR | Status: AC
  Filled 2017-05-17: qty 5

## 2017-05-17 MED ORDER — OXYCODONE HCL 5 MG/5ML PO SOLN: 5.0000 mg | Freq: Once | ORAL | Status: DC | PRN

## 2017-05-17 MED ORDER — OXYCODONE HCL 5 MG PO TABS: 5.0000 mg | ORAL_TABLET | Freq: Once | ORAL | Status: DC | PRN

## 2017-05-17 MED ORDER — METHOCARBAMOL 500 MG PO TABS
ORAL_TABLET | ORAL | Status: AC
  Filled 2017-05-17: qty 1

## 2017-05-17 MED ORDER — LACTATED RINGERS IV SOLN
INTRAVENOUS | Status: DC
  Administered 2017-05-17 (×3): via INTRAVENOUS

## 2017-05-17 MED ORDER — HYDROMORPHONE HCL 1 MG/ML IJ SOLN: 0.5000 mg | INTRAMUSCULAR | Status: DC | PRN

## 2017-05-17 MED ORDER — ALPRAZOLAM 0.5 MG PO TABS
0.5000 mg | ORAL_TABLET | Freq: Every day | ORAL | Status: DC
  Administered 2017-05-17 – 2017-05-18 (×2): 0.5 mg via ORAL
  Filled 2017-05-17 (×2): qty 1

## 2017-05-17 MED ORDER — PROPOFOL 10 MG/ML IV BOLUS
INTRAVENOUS | Status: DC | PRN
  Administered 2017-05-17: 40 mg via INTRAVENOUS
  Administered 2017-05-17: 160 mg via INTRAVENOUS

## 2017-05-17 MED ORDER — PROMETHAZINE HCL 25 MG/ML IJ SOLN: 6.2500 mg | INTRAMUSCULAR | Status: DC | PRN

## 2017-05-17 MED ORDER — SODIUM CHLORIDE 0.9% FLUSH
3.0000 mL | Freq: Two times a day (BID) | INTRAVENOUS | Status: DC
  Administered 2017-05-18 (×2): 3 mL via INTRAVENOUS

## 2017-05-17 MED ORDER — THROMBIN 20000 UNITS EX SOLR
CUTANEOUS | Status: AC
  Filled 2017-05-17: qty 20000

## 2017-05-17 MED ORDER — MENTHOL 3 MG MT LOZG
1.0000 | LOZENGE | OROMUCOSAL | Status: DC | PRN
  Filled 2017-05-17: qty 9

## 2017-05-17 SURGICAL SUPPLY — 73 items
BANDAGE ACE 6X5 VEL STRL LF (GAUZE/BANDAGES/DRESSINGS) IMPLANT
BLADE CLIPPER SURG (BLADE) IMPLANT
BLADE SURG 10 STRL SS (BLADE) ×3 IMPLANT
BONE VIVIGEN FORMABLE 5.4CC (Bone Implant) ×3 IMPLANT
BUR RND DIAMOND ELITE 4.0 (BURR) ×2 IMPLANT
BUR RND DIAMOND ELITE 4.0MM (BURR) ×1
BUR ROUND FLUTED 4 SOFT TCH (BURR) IMPLANT
BUR ROUND FLUTED 4MM SOFT TCH (BURR)
CAGE CONCORDE LIFT 11X21 (Cage) ×2 IMPLANT
CAGE CONCORDE LIFT 11X21MM (Cage) ×1 IMPLANT
CAP SPINAL LOCKING TI (Cap) ×9 IMPLANT
CORDS BIPOLAR (ELECTRODE) ×3 IMPLANT
COVER BACK TABLE 80X110 HD (DRAPES) ×3 IMPLANT
COVER MAYO STAND STRL (DRAPES) ×9 IMPLANT
COVER SURGICAL LIGHT HANDLE (MISCELLANEOUS) ×3 IMPLANT
DERMABOND ADVANCED (GAUZE/BANDAGES/DRESSINGS) ×2
DERMABOND ADVANCED .7 DNX12 (GAUZE/BANDAGES/DRESSINGS) ×1 IMPLANT
DRAPE C-ARM 42X72 X-RAY (DRAPES) ×3 IMPLANT
DRAPE HALF SHEET 40X57 (DRAPES) ×12 IMPLANT
DRAPE MICROSCOPE LEICA (MISCELLANEOUS) ×3 IMPLANT
DRAPE ORTHO SPLIT 77X108 STRL (DRAPES) ×2
DRAPE SURG 17X23 STRL (DRAPES) ×9 IMPLANT
DRAPE SURG ORHT 6 SPLT 77X108 (DRAPES) ×1 IMPLANT
DRSG EMULSION OIL 3X3 NADH (GAUZE/BANDAGES/DRESSINGS) IMPLANT
DRSG MEPILEX BORDER 4X12 (GAUZE/BANDAGES/DRESSINGS) ×3 IMPLANT
DRSG MEPILEX BORDER 4X4 (GAUZE/BANDAGES/DRESSINGS) IMPLANT
DRSG MEPILEX BORDER 4X8 (GAUZE/BANDAGES/DRESSINGS) IMPLANT
DRSG PAD ABDOMINAL 8X10 ST (GAUZE/BANDAGES/DRESSINGS) IMPLANT
DURAPREP 26ML APPLICATOR (WOUND CARE) ×3 IMPLANT
ELECT BLADE 4.0 EZ CLEAN MEGAD (MISCELLANEOUS) ×3
ELECT CAUTERY BLADE 6.4 (BLADE) ×3 IMPLANT
ELECT REM PT RETURN 9FT ADLT (ELECTROSURGICAL) ×3
ELECTRODE BLDE 4.0 EZ CLN MEGD (MISCELLANEOUS) ×1 IMPLANT
ELECTRODE REM PT RTRN 9FT ADLT (ELECTROSURGICAL) ×1 IMPLANT
EVACUATOR 1/8 PVC DRAIN (DRAIN) IMPLANT
GLOVE BIOGEL PI IND STRL 8 (GLOVE) ×2 IMPLANT
GLOVE BIOGEL PI INDICATOR 8 (GLOVE) ×4
GLOVE ORTHO TXT STRL SZ7.5 (GLOVE) ×6 IMPLANT
GOWN STRL REUS W/ TWL LRG LVL3 (GOWN DISPOSABLE) ×2 IMPLANT
GOWN STRL REUS W/TWL 2XL LVL3 (GOWN DISPOSABLE) ×3 IMPLANT
GOWN STRL REUS W/TWL LRG LVL3 (GOWN DISPOSABLE) ×4
HEMOSTAT SURGICEL 2X14 (HEMOSTASIS) IMPLANT
KIT BASIN OR (CUSTOM PROCEDURE TRAY) ×3 IMPLANT
KIT ROOM TURNOVER OR (KITS) ×3 IMPLANT
MANIFOLD NEPTUNE II (INSTRUMENTS) ×3 IMPLANT
NDL SUT .5 MAYO 1.404X.05X (NEEDLE) ×1 IMPLANT
NEEDLE MAYO TAPER (NEEDLE) ×2
NEEDLE SPNL 18GX3.5 QUINCKE PK (NEEDLE) ×9 IMPLANT
NS IRRIG 1000ML POUR BTL (IV SOLUTION) ×3 IMPLANT
PACK LAMINECTOMY ORTHO (CUSTOM PROCEDURE TRAY) ×3 IMPLANT
PAD ARMBOARD 7.5X6 YLW CONV (MISCELLANEOUS) ×6 IMPLANT
PATTIES SURGICAL .5 X.5 (GAUZE/BANDAGES/DRESSINGS) ×6 IMPLANT
PATTIES SURGICAL .75X.75 (GAUZE/BANDAGES/DRESSINGS) IMPLANT
ROD 5.5X65MM (Rod) ×3 IMPLANT
SCREW MATRIX MIS 6.0X40MM (Screw) ×6 IMPLANT
SPONGE LAP 18X18 X RAY DECT (DISPOSABLE) IMPLANT
SPONGE LAP 4X18 X RAY DECT (DISPOSABLE) ×12 IMPLANT
SPONGE SURGIFOAM ABS GEL 100 (HEMOSTASIS) IMPLANT
STAPLER VISISTAT 35W (STAPLE) IMPLANT
SUT BONE WAX W31G (SUTURE) ×3 IMPLANT
SUT VIC AB 1 CTX 36 (SUTURE) ×4
SUT VIC AB 1 CTX36XBRD ANBCTR (SUTURE) ×2 IMPLANT
SUT VIC AB 2-0 CT1 27 (SUTURE) ×2
SUT VIC AB 2-0 CT1 TAPERPNT 27 (SUTURE) ×1 IMPLANT
SUT VIC AB 3-0 X1 27 (SUTURE) ×3 IMPLANT
SUT VICRYL 0 TIES 12 18 (SUTURE) ×3 IMPLANT
SUT VICRYL AB 2 0 TIES (SUTURE) ×3 IMPLANT
TAP SCREW PEDICLE 6MM 180MM (TAP) ×3 IMPLANT
TOWEL OR 17X24 6PK STRL BLUE (TOWEL DISPOSABLE) ×3 IMPLANT
TOWEL OR 17X26 10 PK STRL BLUE (TOWEL DISPOSABLE) ×3 IMPLANT
TRAY FOLEY W/METER SILVER 16FR (SET/KITS/TRAYS/PACK) ×3 IMPLANT
WATER STERILE IRR 1000ML POUR (IV SOLUTION) ×3 IMPLANT
YANKAUER SUCT BULB TIP NO VENT (SUCTIONS) ×3 IMPLANT

## 2017-05-17 NOTE — OR Nursing (Signed)
Spoke with Dr. Ophelia CharterYates about pt leaving back brace at home. Dr. Ophelia CharterYates stated that it was ok for pt to get up without brace and to try and get a family/friend to bring brace tomorrow but pt could wait until she got home to put brace one.

## 2017-05-17 NOTE — Anesthesia Preprocedure Evaluation (Addendum)
Anesthesia Evaluation  Patient identified by MRN, date of birth, ID band Patient awake    Reviewed: Allergy & Precautions, H&P , NPO status , Patient's Chart, lab work & pertinent test results  History of Anesthesia Complications (+) PONV and history of anesthetic complications  Airway Mallampati: III  TM Distance: >3 FB Neck ROM: Full    Dental no notable dental hx. (+) Missing, Dental Advisory Given, Chipped,    Pulmonary pneumonia,    Pulmonary exam normal breath sounds clear to auscultation       Cardiovascular negative cardio ROS   Rhythm:Regular Rate:Normal     Neuro/Psych  Headaches, PSYCHIATRIC DISORDERS Anxiety negative psych ROS   GI/Hepatic negative GI ROS, Neg liver ROS,   Endo/Other  negative endocrine ROS  Renal/GU negative Renal ROS     Musculoskeletal  (+) Arthritis , Osteoarthritis,  Fibromyalgia -  Abdominal   Peds  Hematology negative hematology ROS (+)   Anesthesia Other Findings   Reproductive/Obstetrics negative OB ROS                            Anesthesia Physical  Anesthesia Plan  ASA: II  Anesthesia Plan: General   Post-op Pain Management:    Induction: Intravenous  PONV Risk Score and Plan: 4 or greater and Ondansetron, Dexamethasone, Propofol, Midazolam, Scopolamine patch - Pre-op and Diphenhydramine  Airway Management Planned: Oral ETT  Additional Equipment:   Intra-op Plan:   Post-operative Plan: Extubation in OR  Informed Consent: I have reviewed the patients History and Physical, chart, labs and discussed the procedure including the risks, benefits and alternatives for the proposed anesthesia with the patient or authorized representative who has indicated his/her understanding and acceptance.   Dental advisory given  Plan Discussed with: CRNA  Anesthesia Plan Comments:         Anesthesia Quick Evaluation

## 2017-05-17 NOTE — Transfer of Care (Signed)
Immediate Anesthesia Transfer of Care Note  Patient: Laura Solis  Procedure(s) Performed: L3-L5 Fusion Revision For Migrated Cage, Removal of Migrated Cage, Cage Reposition (N/A )  Patient Location: PACU  Anesthesia Type:General  Level of Consciousness: awake, alert , oriented and patient cooperative  Airway & Oxygen Therapy: Patient Spontanous Breathing and Patient connected to nasal cannula oxygen  Post-op Assessment: Report given to RN, Post -op Vital signs reviewed and stable and Patient moving all extremities  Post vital signs: Reviewed and stable  Last Vitals:  Vitals:   05/17/17 0954  BP: 137/70  Pulse: 77  Resp: 18  Temp: 36.9 C  SpO2: 97%    Last Pain:  Vitals:   05/17/17 1009  TempSrc:   PainSc: 7       Patients Stated Pain Goal: 4 (05/17/17 1009)  Complications: No apparent anesthesia complications

## 2017-05-17 NOTE — Anesthesia Procedure Notes (Signed)
Procedure Name: Intubation Date/Time: 05/17/2017 1:02 PM Performed by: Freddie Breech Pre-anesthesia Checklist: Patient identified, Emergency Drugs available, Suction available and Patient being monitored Patient Re-evaluated:Patient Re-evaluated prior to induction Oxygen Delivery Method: Circle System Utilized Preoxygenation: Pre-oxygenation with 100% oxygen Induction Type: IV induction Ventilation: Mask ventilation without difficulty Laryngoscope Size: Mac and 3 Grade View: Grade II Tube type: Oral Tube size: 7.5 mm Number of attempts: 1 Airway Equipment and Method: Stylet and Oral airway Placement Confirmation: ETT inserted through vocal cords under direct vision,  positive ETCO2 and breath sounds checked- equal and bilateral Secured at: 21 cm Tube secured with: Tape Dental Injury: Teeth and Oropharynx as per pre-operative assessment

## 2017-05-17 NOTE — Interval H&P Note (Signed)
History and Physical Interval Note:  05/17/2017 12:31 PM  Laura Solis  has presented today for surgery, with the diagnosis of L3-L5 Transforaminal Lumbar Interbody Fusion with L4-5 Cage Migration and Radiculopathy  The various methods of treatment have been discussed with the patient and family. After consideration of risks, benefits and other options for treatment, the patient has consented to  Procedure(s): L3-L5 Fusion Revision For Migrated Cage, Removal of Migrated Cage, Cage Reposition (N/A) as a surgical intervention .  The patient's history has been reviewed, patient examined, no change in status, stable for surgery.  I have reviewed the patient's chart and labs.  Questions were answered to the patient's satisfaction.     Eldred MangesMark C Yates

## 2017-05-17 NOTE — Progress Notes (Signed)
Orthopedic Tech Progress Note Patient Details:  Laura Solis 07/22/1951 [id number] Patient has brace. Patient ID: Laura Solis, female   DOB: 10/18/1950, 66 y.o.   MRN: [id number]   Laura Solis, Laura Solis 05/17/2017, 6:45 PM

## 2017-05-17 NOTE — Op Note (Addendum)
preop diagnosis: L3-4 L4-5 fusion with L4-5 migrated cage causing radiculopathy .  Postop diagnosis same  Procedure: Revision Transforaminal interbody fusionL4-5. Removal of migrated cage that had migrated into the canal causing radiculopathy. Local bone used for Revision left TLIF at L4-5 plus  5.4cc Vivigen and 8-7613mm Concorde LIFT  Expandable cage expanded 11 mm with removal L4 and L5 pedicle screws and left L4 and L5 pedicle screws  Repositioning.  (2 pedicle screws removed, 2 new pedicle screws placed., L4-5 cage removed and new L4-5 cage placed) operative microscope used for cage removal and new cage placement.  Complications: None  EBL: See anesthetic record. Cell Saver use but not enough blood obtained for re-transfusion.  Surgeon: Annell GreeningMark Yaeko Fazekas M.D.  Assistant: Zonia KiefJames Owens PA-C medically necessary and present for the entire procedure  Anesthesia: Gen. Plus local  Brief history 66 year old female underwent two-level fusion for spondylolisthesis with stenosis and severe foraminal stenosis L3-4 L4-5. Postoperative x-ray showed complete migration of the L4-5 cage out of the disc space into the epidural space covering 50% of the canal space but patient had resolution of leg pain for. 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 She developed gradual increasing left leg pain severe with some recurrence of listhesis at L4-5. CT scan showed left L4 and L5 pedicle screws were laterally positioned not causing nerve root compression but not solidly within the vertebral body. Screws in the right side all looked good. Left L3 screw looked good. Cage had migrated into the canal causing severe radiculopathy with weakness and she is brought in for revision and replacement of cage.  Procedure after induction general anesthesia Ancef prophylaxis spine frame prone positioning careful padding, SCD  pumpers timeout proceduresterile skin marker after DuraPrep the  area squared with towels Betadine Steri-Drape applied and laminectomy sheet. Old midline incision was opened and dissection continued down the spinous process above and below down onto the lamina and then identification of the cross-link which was between screws at L3 and L4. Cross-link was removed rod and screws on the right side were in good position well compressed and were left alone. On the left side rod was removed after Been Removed and the L4 and L5 Screws Were Removed. Using the 4 Mm Bur Laterally Pedicle under Direct C-Arm Visualizationtop the pedicle was opened up and the all was used checking under C-arm then followed by the joystick going down the pedicle checking under C-arm using the pedicle feeler tapping and then placement of the screw with good convergence and C-arm used to visualize screw going directly down the pedicle and the vertebral body. Falling down the medial pedicle wall at L4 and slightly slight cephalad towards the cage was finally identified using operative microscope and microscopic dissection around the cages performed protecting the dura and then the cage was removed. There is soft tissue that are grown through the  cagewhich had to be freed up for the cage could be removed with the cage extractor protecting the dura. No CSF leak. There was abundant granulation tissue around the cage. No evidence of infection. Disc space was identified confirmed with C-arm with the [facility] the disc space. Endplates were scraped local bone that been removed during the case was clean soft tissue packed into the disc space along with 5.4 mL Vivagen that a been prepared mixed and placed in the small log rolls. These were packed anteriorly in the space with patient still  had bone that a been previously placed from the previous lumbar decompression and removal of the facets on the left side. Expandable cage was inserted cemented 8 mm after Vivagen have been placed in the cage. Cage was expanded under  C-arm up to 11 mm port was tight restoration of disc space height correction of the spondylolisthesis with the cage countersunk 2 mm and then additional Vivagen were placed through the tube and packed into the cage. C-arm was used to again confirm good position of L4 and L5 pedicle screws on the left looking down the pedicle checking AP and lateral. Final spot pictures were taken fluoroscopically documenting good position with bayonet placed on the right side. The L3-4 level was solid and there was no motion between the pedicle screws. Rod was placed and then recompression performed at both levels locking and the screws with the rod tightener. Attempt to reconnect of the crossarm linkage did not allow enough room between the upper pedicle screws was attempted be placed from the right at L3-4 down to the left at L4-5 trying to twisted but is still would not exactly fit and the cross bar linkage was left off. Copious irrigation was performed. There was not enough blood from the Cell Saver for re-transfusion and after final inspection of the dura lateral gutter with dura intact operative field dry #1 Vicryl was used for closure of the deep layer 2-0 Vicryl subtendinous tissue skin staple closure postop dressing and transferred to the recovery room. Patient tolerated procedure well.      In the recovery room PACU neurologic exam showed normal strength resolution of her leg pain normal sensation and good relief of left lower extremity symptoms.

## 2017-05-17 NOTE — H&P (Signed)
Laura Solis is an 66 y.o. female.   Chief Complaint: Back pain and leg pain HPI: Patient with history of lumbar stenosis, migrated cage and the above complaint presents to the hospital for surgical intervention. Failed conservative treatment.  Past Medical History:  Diagnosis Date  . ADHD (attention deficit hyperactivity disorder)   . Anxiety    "from the fibromyalgia"  . Chronic lower back pain   . Fibromyalgia   . Headache    "bad one; monthly" (01/11/2017)  . Osteoarthritis    "all over" (01/11/2017)  . Pneumonia    "I've had it about 3 times" (01/11/2017)  . PONV (postoperative nausea and vomiting)     Past Surgical History:  Procedure Laterality Date  . BACK SURGERY    . CARPAL TUNNEL RELEASE Bilateral   . CYST EXCISION     behind rt ear,coxxyx  . DILATION AND CURETTAGE OF UTERUS    . SHOULDER OPEN ROTATOR CUFF REPAIR Bilateral   . TONSILLECTOMY    . TOTAL KNEE ARTHROPLASTY Right 10/02/2012   Procedure: TOTAL KNEE ARTHROPLASTY;  Surgeon: Valeria BatmanPeter W [name], MD;  [facility];  Service: Orthopedics;  Laterality: Right;  Right Total Knee Arthroplasty  . TRANSFORAMINAL LUMBAR INTERBODY FUSION (TLIF) WITH PEDICLE SCREW FIXATION 2 LEVEL Left 01/09/2017   L3-4, L4-5/notes 01/09/2017  . TUBAL LIGATION    . VAGINAL HYSTERECTOMY      History reviewed. No pertinent family history. Social History:  reports that she has never smoked. She has never used smokeless tobacco. She reports that she does not drink alcohol or use drugs.  Allergies:  Allergies  Allergen Reactions  . Other Other (See Comments)    Anesthesia causes the patient to not be able to eat for a week or so after anesthesia. She states it makes all food tastes like salt.(given when had knee surgery)    Medications Prior to Admission  Medication Sig Dispense Refill  . ALPRAZolam (XANAX) 0.5 MG tablet Take 0.5 mg by mouth at bedtime.     Marland Kitchen. amphetamine-dextroamphetamine (ADDERALL) 10 MG tablet Take 10 mg by mouth 2  (two) times daily.    . ciprofloxacin (CIPRO) 500 MG tablet Take 1 tablet (500 mg total) by mouth 2 (two) times daily. 10 tablet 0  . FLUoxetine (PROZAC) 20 MG capsule Take 20 mg by mouth at bedtime.     Marland Kitchen. ibuprofen (ADVIL,MOTRIN) 600 MG tablet Take 600 mg by mouth 3 (three) times daily as needed for mild pain or moderate pain.     . Ibuprofen-Diphenhydramine HCl (IBUPROFEN PM) 200-25 MG CAPS Take 2 tablets by mouth at bedtime as needed (sleep).    . Potassium Chloride ER 20 MEQ TBCR Take 2 tablets daily (40 meq) by mouth daily. 40 tablet 0  . pramipexole (MIRAPEX) 1 MG tablet Take 1 mg by mouth at bedtime.     . prednisoLONE acetate (PRED FORTE) 1 % ophthalmic suspension Place 1 drop into the left eye 2 (two) times daily.  1    Results for orders placed or performed during the hospital encounter of 05/17/17 (from the past 48 hour(s))  I-STAT 4, (NA,K, GLUC, HGB,HCT)     Status: Abnormal   Collection Time: 05/17/17 10:24 AM  Result Value Ref Range   Sodium 141 135 - 145 mmol/L   Potassium 3.8 3.5 - 5.1 mmol/L   Glucose, Bld 122 (H) 65 - 99 mg/dL   HCT 16.140.0 09.636.0 - 04.546.0 %   Hemoglobin 13.6 12.0 - 15.0 g/dL  No results found.  ROS No current cardiopulmonary GI GU issues Blood pressure 137/70, pulse 77, temperature 98.4 F (36.9 C), temperature source Oral, resp. rate 18, height 5' 5.5" (1.664 m), weight 210 lb 5 oz (95.4 kg), SpO2 97 %. Physical Exam  Constitutional: She is oriented to person, place, and time. No distress.  HENT:  Head: Normocephalic and atraumatic.  Eyes: Pupils are equal, round, and reactive to light. EOM are normal.  Neck: Normal range of motion.  Respiratory: No respiratory distress.  GI: She exhibits no distension.  Musculoskeletal:  Ortho Exam patient gets from sitting to standing position easily. She has dorsal foot numbness on the left. Anterior tib EHL is strong. Gastrocsoleus is strong. Lateral calf numbness on the left sensation on the right is normal.  Monocyte notch tenderness on the left negative on the right. Lumbar incision is well-healed. Normal hip range of motion distal pulses are 2+. She has mild crepitus right knee where she has some osteoarthritis. Quad strength is strong flexion weakness hamstrings are normal. Distal pulses are 2+ and symmetrical. Patient has normal saddle anesthesia.  Neurological: She is alert and oriented to person, place, and time.  Skin: Skin is warm and dry.  Psychiatric: She has a normal mood and affect.     Assessment/Plan S/P lumbar fusion       With migrated cage into the spinal canal with lateral recess stenosis at the L4-5 level.  We will proceed with L3-L5 Fusion Revision For Migrated Cage, Removal of Migrated Cage, Cage Reposition as scheduled. Surgical procedure along with possible risks, and complications discussed. All questions answered.   Zonia Kief, PA-C 05/17/2017, 12:08 PM

## 2017-05-18 MED FILL — Heparin Sodium (Porcine) Inj 1000 Unit/ML: INTRAMUSCULAR | Qty: 30 | Status: AC

## 2017-05-18 MED FILL — Sodium Chloride IV Soln 0.9%: INTRAVENOUS | Qty: 1000 | Status: AC

## 2017-05-18 NOTE — Evaluation (Signed)
Occupational Therapy Evaluation Patient Details Name: Laura Solis MRN: [id number] DOB: 06/17/1951 Today's Date: 05/18/2017    History of Present Illness Pt is a 66 y/o female s/p Revision Transforaminal interbody fusion L4-5 with removal of migrated cage, cage reposition; PMHx includes TLIF with pedicle screw fixation L3-L5 in June 2018, ADHD, anxiety, and fibromyalgia   Clinical Impression   This [age] y/o F presents with the above. At baseline Pt is mod independent with ADLs and functional mobility. Pt completed functional mobility at RW level with MinGuard assist this session, requires MinA for LB ADLs using AE. Education provided on AE and compensatory techniques for completing ADLs while adhering to back precautions with Pt return demonstrating and verbalizing understanding. Pt will benefit from continued acute OT services and additional HHOT services after return home to maximize Pt's safety and independence with ADLs and functional mobility while adhering to back precautions.    Follow Up Recommendations  Home health OT;Supervision - Intermittent    Equipment Recommendations  None recommended by OT           Precautions / Restrictions Precautions Precautions: Back;Fall Precaution Booklet Issued: Yes (comment) Precaution Comments: handout issued and reviewed  Required Braces or Orthoses: Spinal Brace Other Brace/Splint: Orders for aspen lumbar brace, however Per notes MD states "ok for pt to get up without brace" as Pt has left brace at home  Restrictions Weight Bearing Restrictions: No      Mobility Bed Mobility Overal bed mobility: Needs Assistance Bed Mobility: Supine to Sit;Sit to Supine;Rolling Rolling: Supervision   Supine to sit: Supervision Sit to supine: Supervision   General bed mobility comments: Min verbal cues for log roll technique and supervision for safety, overall good return demonstration of log roll technique   Transfers Overall transfer level:  Needs assistance Equipment used: Rolling walker (2 wheeled) Transfers: Sit to/from Stand Sit to Stand: Min assist;Min guard         General transfer comment: close guard for safety, verbal cues for hand placement     Balance Overall balance assessment: Needs assistance Sitting-balance support: Feet supported Sitting balance-Leahy Scale: Good Sitting balance - Comments: static sitting EOB    Standing balance support: Bilateral upper extremity supported;During functional activity Standing balance-Leahy Scale: Fair                             ADL either performed or assessed with clinical judgement   ADL Overall ADL's : Needs assistance/impaired Eating/Feeding: Independent;Sitting   Grooming: Min guard;Standing   Upper Body Bathing: Min guard;Sitting   Lower Body Bathing: Minimal assistance;Sit to/from stand;Cueing for back precautions   Upper Body Dressing : Min guard;Sitting   Lower Body Dressing: Minimal assistance;Sit to/from stand;Cueing for back precautions;With adaptive equipment Lower Body Dressing Details (indicate cue type and reason): educated on use of reacher for completing task with Pt return demonstrating (simulated with use of pillowcase)  Toilet Transfer: Min guard;Ambulation;Grab bars;RW   Toileting- ArchitectClothing Manipulation and Hygiene: Min guard;Sit to/from Nurse, children'sstand     Tub/Shower Transfer Details (indicate cue type and reason): educated on safe tub transfer technique, recommend Pt has supervision initially when completing transfer for increased safety  Functional mobility during ADLs: Min guard;Rolling walker General ADL Comments: educated on compensatory techniques and AE for ADL task completion while adhering to back precautions with Pt verbalizing/demonstrating good understanding  Pertinent Vitals/Pain Pain Assessment: Faces Faces Pain Scale: Hurts a little bit Pain Location: back  Pain Descriptors /  Indicators: Sore Pain Intervention(s): Monitored during session;Repositioned;Limited activity within patient's tolerance     Hand Dominance Left   Extremity/Trunk Assessment Upper Extremity Assessment Upper Extremity Assessment: Overall WFL for tasks assessed   Lower Extremity Assessment Lower Extremity Assessment: Defer to PT evaluation   Cervical / Trunk Assessment Cervical / Trunk Assessment: Other exceptions Cervical / Trunk Exceptions: s/p lumbar revision    Communication Communication Communication: No difficulties   Cognition Arousal/Alertness: Awake/alert Behavior During Therapy: WFL for tasks assessed/performed Overall Cognitive Status: Within Functional Limits for tasks assessed                                                      Home Living Family/patient expects to be discharged to:: Private residence Living Arrangements: Children (daughter ) Available Help at Discharge: Available PRN/intermittently Type of Home: Mobile home       Home Layout: One level     Bathroom Shower/Tub: Chief Strategy Officer: Standard     Home Equipment: Shower seat;Bedside commode          Prior Functioning/Environment Level of Independence: Independent with assistive device(s)        Comments: using RW for mobility         OT Problem List: Decreased strength;Decreased knowledge of use of DME or AE;Decreased activity tolerance;Decreased knowledge of precautions      OT Treatment/Interventions: Self-care/ADL training;DME and/or AE instruction;Therapeutic activities;Balance training;Therapeutic exercise;Energy conservation;Patient/family education    OT Goals(Current goals can be found in the care plan section) Acute Rehab OT Goals Patient Stated Goal: Pt wishes to go to SNF OT Goal Formulation: With patient Time For Goal Achievement: 06/01/17 Potential to Achieve Goals: Good  OT Frequency: Min 2X/week                              AM-PAC PT "6 Clicks" Daily Activity     Outcome Measure Help from another person eating meals?: None Help from another person taking care of personal grooming?: A Little Help from another person toileting, which includes using toliet, bedpan, or urinal?: A Little Help from another person bathing (including washing, rinsing, drying)?: A Lot Help from another person to put on and taking off regular upper body clothing?: None Help from another person to put on and taking off regular lower body clothing?: A Lot 6 Click Score: 18   End of Session Equipment Utilized During Treatment: Gait belt;Rolling walker Nurse Communication: Mobility status  Activity Tolerance: Patient tolerated treatment well Patient left: in bed;with call bell/phone within reach;with family/visitor present  OT Visit Diagnosis: Other abnormalities of gait and mobility (R26.89);Unsteadiness on feet (R26.81)                Time: 1610-9604 OT Time Calculation (min): 25 min Charges:  OT General Charges $OT Visit: 1 Visit OT Evaluation $OT Eval Low Complexity: 1 Low G-Codes:     Marcy Siren, OT Pager [phone] 05/18/2017   Laura Solis 05/18/2017, 8:57 AM

## 2017-05-18 NOTE — Progress Notes (Signed)
Patient ID: Laura Solis, female   DOB: 06/04/1951, 66 y.o.   MRN: [id number] Care management team states patient likely to not meet requirements for medical necessity for skilled nursing facility. Will order home health PT and bath aide to help patient. Patient is elected to go home after discharge likely discharge Friday

## 2017-05-18 NOTE — Care Management Note (Addendum)
Case Management Note  Patient Details  Name: Laura Solis MRN: [id number] Date of Birth: 11/24/1950  Subjective/Objective:  66 yr old female s/p L3-L5 Fusion revision.                   Action/Plan: Case manager spoke with patient and her daughter concerning discharge plan. Patient says she wants to go to SNF for rehab. CM explained Medicare regulations concerning medical necessity. Patient is walking well per staff. Lives alone but has family. Case Manager offered choice for Home Health Agency. [place], [place] and [facility] CM called referral to Kim at [phone], faxed [phone]. Start of care will be Monday, 05/22/17.    Expected Discharge Date:  05/19/17           Expected Discharge Plan:  Home w Home Health Services  In-House Referral:  NA  Discharge planning Services  CM Consult  Post Acute Care Choice:  Home Health Choice offered to:  Patient, Adult Children  DME Arranged:  N/A DME Agency:  NA  HH Arranged:  PT, Nurse's Aide [facility]  Status of Service:  Completed, signed off  If discussed at Long Length of Stay Meetings, dates discussed:    Additional Comments:  Durenda GuthrieBrady, Sherissa Tenenbaum Naomi, RN 05/18/2017, 2:13 PM

## 2017-05-18 NOTE — Evaluation (Signed)
Physical Therapy Evaluation Patient Details Name: Laura Solis MRN: [id number] DOB: [birth year] Today's Date: 05/18/2017   History of Present Illness  Pt is a 66 y/o female s/p Revision Transforaminal interbody fusion L4-5 with removal of migrated cage, cage reposition; PMHx includes TLIF with pedicle screw fixation L3-L5 in June 2018, ADHD, anxiety, and fibromyalgia  Clinical Impression  Pt admitted with above diagnosis. Pt currently with functional limitations due to the deficits listed below (see PT Problem List). At the time of PT eval pt was able to perform transfers and ambulation with gross supervision for safety, and min guard assist for stair training. Pt moving well with RW and did not note any unsteadiness or LOB with functional mobility. Pt reports she wishes to go to SNF at d/c for rehab "for a week" before returning home due to limited family support. Pt will have family available in the afternoons, and feel pt is currently functioning well enough to return home with home health PT to follow. Pt will benefit from skilled PT to increase their independence and safety with mobility to allow discharge to the venue listed below.       Follow Up Recommendations Home health PT;Supervision - Intermittent    Equipment Recommendations  None recommended by PT    Recommendations for Other Services       Precautions / Restrictions Precautions Precautions: Back;Fall Precaution Booklet Issued: Yes (comment) Precaution Comments: Reviewed handout and pt was cued for precautions during functional mobility Required Braces or Orthoses: Spinal Brace Other Brace/Splint: Orders for aspen lumbar brace, however Per notes MD states "ok for pt to get up without brace" as Pt has left brace at home  Restrictions Weight Bearing Restrictions: No      Mobility  Bed Mobility Overal bed mobility: Needs Assistance Bed Mobility: Rolling;Sidelying to Sit Rolling: Modified independent (Device/Increase  time) Sidelying to sit: Supervision Supine to sit: Supervision Sit to supine: Supervision   General bed mobility comments: Pt able to complete log roll without difficulty. Use of rails required for leverage.  Transfers Overall transfer level: Needs assistance Equipment used: Rolling walker (2 wheeled) Transfers: Sit to/from Stand Sit to Stand: Supervision         General transfer comment: VC's for hand placement on seated surface for safety. No unsteadiness or LOB noted.   Ambulation/Gait Ambulation/Gait assistance: Supervision Ambulation Distance (Feet): 400 Feet Assistive device: Rolling walker (2 wheeled) Gait Pattern/deviations: Step-through pattern;Decreased stride length;Trunk flexed Gait velocity: Decreased Gait velocity interpretation: Below normal speed for age/gender General Gait Details: VC's for improved posture and general safety with RW. No unsteadiness or LOB noted - supervision provided for safety throughout.   Stairs Stairs: Yes Stairs assistance: Min guard Stair Management: One rail Right;Step to pattern;Forwards;Sideways Number of Stairs: 7 General stair comments: VC's for sequencing and general safety with stair negotiation. Pt was able to ascend forwards but descended sideways so BUE's could have support on the railing  Wheelchair Mobility    Modified Rankin (Stroke Patients Only)       Balance Overall balance assessment: Needs assistance Sitting-balance support: Feet supported Sitting balance-Leahy Scale: Good Sitting balance - Comments: static sitting EOB    Standing balance support: Bilateral upper extremity supported;During functional activity Standing balance-Leahy Scale: Fair                               Pertinent Vitals/Pain Pain Assessment: Faces Faces Pain Scale: Hurts a little bit Pain  Location: back  Pain Descriptors / Indicators: Sore Pain Intervention(s): Monitored during session    Home Living Family/patient  expects to be discharged to:: Private residence Living Arrangements: Children (daughter ) Available Help at Discharge: Available PRN/intermittently Type of Home: Mobile home Home Access: Stairs to enter Entrance Stairs-Rails: Right;Left;Can reach both Entrance Stairs-Number of Steps: 7 Home Layout: One level Home Equipment: Shower seat;Bedside commode;Walker - 2 wheels;Cane - single point      Prior Function Level of Independence: Independent with assistive device(s)         Comments: using RW for mobility and furniture walking     Hand Dominance   Dominant Hand: Left    Extremity/Trunk Assessment   Upper Extremity Assessment Upper Extremity Assessment: Overall WFL for tasks assessed    Lower Extremity Assessment Lower Extremity Assessment: Generalized weakness    Cervical / Trunk Assessment Cervical / Trunk Assessment: Other exceptions Cervical / Trunk Exceptions: s/p lumbar revision   Communication   Communication: No difficulties  Cognition Arousal/Alertness: Awake/alert Behavior During Therapy: WFL for tasks assessed/performed Overall Cognitive Status: Within Functional Limits for tasks assessed                                        General Comments      Exercises     Assessment/Plan    PT Assessment Patient needs continued PT services  PT Problem List Decreased strength;Decreased range of motion;Decreased activity tolerance;Decreased balance;Decreased mobility;Decreased knowledge of use of DME;Decreased safety awareness;Decreased knowledge of precautions;Pain       PT Treatment Interventions DME instruction;Gait training;Stair training;Functional mobility training;Therapeutic activities;Therapeutic exercise;Neuromuscular re-education;Patient/family education    PT Goals (Current goals can be found in the Care Plan section)  Acute Rehab PT Goals Patient Stated Goal: Pt wishes to go to SNF PT Goal Formulation: With patient Time For  Goal Achievement: 05/25/17 Potential to Achieve Goals: Good    Frequency Min 5X/week   Barriers to discharge Decreased caregiver support pt reports she will not have 24 hour assistance    Co-evaluation               AM-PAC PT "6 Clicks" Daily Activity  Outcome Measure Difficulty turning over in bed (including adjusting bedclothes, sheets and blankets)?: None Difficulty moving from lying on back to sitting on the side of the bed? : A Little Difficulty sitting down on and standing up from a chair with arms (e.g., wheelchair, bedside commode, etc,.)?: A Little Help needed moving to and from a bed to chair (including a wheelchair)?: A Little Help needed walking in hospital room?: A Little Help needed climbing 3-5 steps with a railing? : A Little 6 Click Score: 19    End of Session Equipment Utilized During Treatment: Gait belt Activity Tolerance: Patient tolerated treatment well Patient left: in chair;with call bell/phone within reach Nurse Communication: Mobility status PT Visit Diagnosis: Pain;Other symptoms and signs involving the nervous system (R29.898) Pain - part of body:  (back)    Time: [id number] PT Time Calculation (min) (ACUTE ONLY): 18 min   Charges:   PT Evaluation $PT Eval Moderate Complexity: 1 Mod     PT G Codes:        Conni SlipperLaura Courtnie Brenes, PT, DPT Acute Rehabilitation Services Pager: [phone]   Marylynn PearsonLaura D Jowana Thumma 05/18/2017, 12:50 PM

## 2017-05-18 NOTE — Anesthesia Postprocedure Evaluation (Signed)
Anesthesia Post Note  Patient: Laura Solis  Procedure(s) Performed: L3-L5 Fusion Revision For Migrated Cage, Removal of Migrated Cage, Cage Reposition (N/A )     Patient location during evaluation: PACU Anesthesia Type: General Level of consciousness: awake and alert Pain management: pain level controlled Vital Signs Assessment: post-procedure vital signs reviewed and stable Respiratory status: spontaneous breathing, nonlabored ventilation, respiratory function stable and patient connected to nasal cannula oxygen Cardiovascular status: blood pressure returned to baseline and stable Postop Assessment: no apparent nausea or vomiting Anesthetic complications: no    Last Vitals:  Vitals:   05/18/17 0002 05/18/17 0410  BP: (!) 108/58 (!) 98/56  Pulse: 97 80  Resp: 18 18  Temp: 36.7 C (!) 36.4 C  SpO2: 96% 96%    Last Pain:  Vitals:   05/18/17 0410  TempSrc: Oral  PainSc:                  [name],W. EDMOND

## 2017-05-19 MED ORDER — METHOCARBAMOL 500 MG PO TABS: 500.0000 mg | ORAL_TABLET | Freq: Four times a day (QID) | ORAL | 0 refills | Status: DC | PRN

## 2017-05-19 MED ORDER — OXYCODONE-ACETAMINOPHEN 5-325 MG PO TABS: 1.0000 | ORAL_TABLET | ORAL | 0 refills | Status: DC | PRN

## 2017-05-19 NOTE — Progress Notes (Signed)
Physical Therapy Treatment Patient Details Name: Laura Solis MRN: [id number] DOB: 01/28/1951 Today's Date: 05/19/2017    History of Present Illness Pt is a 66 y/o female s/p Revision Transforaminal interbody fusion L4-5 with removal of migrated cage, cage reposition; PMHx includes TLIF with pedicle screw fixation L3-L5 in June 2018, ADHD, anxiety, and fibromyalgia    PT Comments    Pt progressing towards physical therapy goals. Was able to perform transfers and ambulation at a gross supervision level for safety. Pt again practiced stair negotiation and was able to complete a full flight of stairs without assistance. Will continue to follow and progress as able per POC.    Follow Up Recommendations  Home health PT;Supervision - Intermittent     Equipment Recommendations  None recommended by PT    Recommendations for Other Services       Precautions / Restrictions Precautions Precautions: Back;Fall Precaution Booklet Issued: Yes (comment) Precaution Comments: Reviewed handout and pt was cued for precautions during functional mobility Required Braces or Orthoses: Spinal Brace Other Brace/Splint: Orders for aspen lumbar brace, however Per notes MD states "ok for pt to get up without brace" as Pt has left brace at home  Restrictions Weight Bearing Restrictions: No    Mobility  Bed Mobility Overal bed mobility: Needs Assistance Bed Mobility: Rolling;Sidelying to Sit;Sit to Sidelying Rolling: Modified independent (Device/Increase time) Sidelying to sit: Supervision     Sit to sidelying: Supervision General bed mobility comments: Pt able to complete log roll without difficulty. HOB flat and rails lowered to simulate home environment.   Transfers Overall transfer level: Needs assistance Equipment used: Rolling walker (2 wheeled) Transfers: Sit to/from Stand Sit to Stand: Supervision         General transfer comment: VC's for hand placement on seated surface for safety.  No unsteadiness or LOB noted. Pt stood both from EOB and from toilet  Ambulation/Gait Ambulation/Gait assistance: Supervision Ambulation Distance (Feet): 400 Feet Assistive device: Rolling walker (2 wheeled) Gait Pattern/deviations: Step-through pattern;Decreased stride length;Trunk flexed Gait velocity: Decreased Gait velocity interpretation: Below normal speed for age/gender General Gait Details: VC's for improved posture and general safety with RW. No unsteadiness or LOB noted - supervision provided for safety throughout.    Stairs Stairs: Yes   Stair Management: One rail Right;Step to pattern;Forwards;Sideways Number of Stairs: 10 General stair comments: VC's for sequencing and general safety with stair negotiation. Pt was able to ascend forwards but descended sideways so BUE's could have support on the railing  Wheelchair Mobility    Modified Rankin (Stroke Patients Only)       Balance Overall balance assessment: Needs assistance Sitting-balance support: Feet supported Sitting balance-Leahy Scale: Good     Standing balance support: Bilateral upper extremity supported;During functional activity Standing balance-Leahy Scale: Fair                              Cognition Arousal/Alertness: Awake/alert Behavior During Therapy: WFL for tasks assessed/performed Overall Cognitive Status: Within Functional Limits for tasks assessed                                        Exercises      General Comments        Pertinent Vitals/Pain Pain Assessment: Faces Faces Pain Scale: Hurts a little bit Pain Location: back  Pain Descriptors / Indicators: Sore  Pain Intervention(s): Monitored during session    Home Living                      Prior Function            PT Goals (current goals can now be found in the care plan section) Acute Rehab PT Goals Patient Stated Goal: Pt wishes to go to SNF PT Goal Formulation: With patient Time  For Goal Achievement: 05/25/17 Potential to Achieve Goals: Good Progress towards PT goals: Progressing toward goals    Frequency    Min 5X/week      PT Plan Current plan remains appropriate    Co-evaluation              AM-PAC PT "6 Clicks" Daily Activity  Outcome Measure  Difficulty turning over in bed (including adjusting bedclothes, sheets and blankets)?: None Difficulty moving from lying on back to sitting on the side of the bed? : A Little Difficulty sitting down on and standing up from a chair with arms (e.g., wheelchair, bedside commode, etc,.)?: A Little Help needed moving to and from a bed to chair (including a wheelchair)?: A Little Help needed walking in hospital room?: A Little Help needed climbing 3-5 steps with a railing? : A Little 6 Click Score: 19    End of Session Equipment Utilized During Treatment: Gait belt Activity Tolerance: Patient tolerated treatment well Patient left: in chair;with call bell/phone within reach Nurse Communication: Mobility status PT Visit Diagnosis: Pain;Other symptoms and signs involving the nervous system (R29.898) Pain - part of body:  (back)     Time: 1610-9604 PT Time Calculation (min) (ACUTE ONLY): 19 min  Charges:  $Gait Training: 8-22 mins                    G Codes:       Conni Slipper, PT, DPT Acute Rehabilitation Services Pager: [phone]    Marylynn Pearson 05/19/2017, 8:44 AM

## 2017-05-19 NOTE — Progress Notes (Signed)
   Subjective: 2 Days Post-Op Procedure(s) (LRB): L3-L5 Fusion Revision For Migrated Cage, Removal of Migrated Cage, Cage Reposition (N/A) Patient reports pain as mild and moderate.    Objective: Vital signs in last 24 hours: Temp:  [98.1 F (36.7 C)-98.6 F (37 C)] 98.6 F (37 C) (10/19 0902) Pulse Rate:  [82-90] 84 (10/19 0902) Resp:  [18-20] 18 (10/19 0902) BP: (95-133)/(47-74) 133/74 (10/19 0902) SpO2:  [92 %-95 %] 93 % (10/19 0902)  Intake/Output from previous day: No intake/output data recorded. Intake/Output this shift: No intake/output data recorded.   Recent Labs  05/17/17 1024  HGB 13.6    Recent Labs  05/17/17 1024  HCT 40.0    Recent Labs  05/17/17 1024  NA 141  K 3.8  GLUCOSE 122*   No results for input(s): LABPT, INR in the last 72 hours.  Neurologically intact No results found.  Assessment/Plan: 2 Days Post-Op Procedure(s) (LRB): L3-L5 Fusion Revision For Migrated Cage, Removal of Migrated Cage, Cage Reposition (N/A) Up with therapy, discharge home today. HHPT, HHBA. ROV two weeks. Dressing change by RN. Daughter at bedside.   Eldred MangesMark C Yates 05/19/2017, 10:22 AM

## 2017-05-19 NOTE — Progress Notes (Signed)
Occupational Therapy Treatment Patient Details Name: Laura Solis MRN: [id number] DOB: 12/13/1950 Today's Date: 05/19/2017    History of present illness Pt is a 66 y/o female s/p Revision Transforaminal interbody fusion L4-5 with removal of migrated cage, cage reposition; PMHx includes TLIF with pedicle screw fixation L3-L5 in June 2018, ADHD, anxiety, and fibromyalgia   OT comments  Pt progressing towards goals. Completed UB/LB dressing with MinA for LB dressing, completed standing grooming ADLs and functional mobility at RW level with MinGuard assist. Min verbal cues for adherence to back precautions during mobility and functional task completion. Feel Pt will safely progress home with intermittent supervision/assist from family PRN. POC remains appropriate; will continue to follow while in acute setting.    Follow Up Recommendations  Home health OT;Supervision - Intermittent    Equipment Recommendations  None recommended by OT          Precautions / Restrictions Precautions Precautions: Back;Fall Precaution Booklet Issued: Yes (comment) Precaution Comments: Reviewed handout and pt was cued for precautions during functional mobility Required Braces or Orthoses: Spinal Brace Other Brace/Splint: Orders for aspen lumbar brace, however Per notes MD states "ok for pt to get up without brace" as Pt has left brace at home  Restrictions Weight Bearing Restrictions: No       Mobility Bed Mobility Overal bed mobility: Needs Assistance Bed Mobility: Rolling;Sidelying to Sit;Sit to Sidelying Rolling: Modified independent (Device/Increase time) Sidelying to sit: Supervision Supine to sit: Supervision Sit to supine: Supervision Sit to sidelying: Supervision General bed mobility comments: Pt able to complete log roll without difficulty.   Transfers Overall transfer level: Needs assistance Equipment used: Rolling walker (2 wheeled) Transfers: Sit to/from Stand Sit to Stand:  Supervision         General transfer comment: VC's for hand placement on seated surface for safety and to ensure adherence to back precautions     Balance Overall balance assessment: Needs assistance Sitting-balance support: Feet supported Sitting balance-Leahy Scale: Good Sitting balance - Comments: static sitting EOB    Standing balance support: Bilateral upper extremity supported;During functional activity Standing balance-Leahy Scale: Fair Standing balance comment: static standing at sink                            ADL either performed or assessed with clinical judgement   ADL Overall ADL's : Needs assistance/impaired     Grooming: Min guard;Standing;Oral care;Wash/dry face           Upper Body Dressing : Min guard;Set up;Sitting   Lower Body Dressing: Sit to/from stand;Cueing for back precautions;Minimal assistance Lower Body Dressing Details (indicate cue type and reason): Pt able to don pants and underwear using figure 4 technique without difficulty; one LOB while standing to advance underwear over hips and Pt returned to sitting EOB with MinA, educated on safety during LB dressing during task completion at home       Toileting - Clothing Manipulation Details (indicate cue type and reason): educated on AE for completing task    Tub/Shower Transfer Details (indicate cue type and reason): reviewed safe tub transfer techniques while adhering to back precautions; Pt reports her tub is wide enough to sit on the ledge to complete task as well as to transfer in/out of tub Functional mobility during ADLs: Min guard;Rolling walker General ADL Comments: reviewed compensatory techniques and AE for ADL task completion while adhering to back precautions with Pt verbalizing/demonstrating understanding throughout  Cognition Arousal/Alertness: Awake/alert Behavior During Therapy: WFL for tasks assessed/performed Overall Cognitive Status:  Within Functional Limits for tasks assessed                                                      General Comments Pt's daughter present during session     Pertinent Vitals/ Pain       Pain Assessment: Faces Faces Pain Scale: Hurts little more Pain Location: back  Pain Descriptors / Indicators: Sore Pain Intervention(s): Monitored during session;Repositioned                                                          Frequency  Min 2X/week        Progress Toward Goals  OT Goals(current goals can now be found in the care plan section)  Progress towards OT goals: Progressing toward goals  Acute Rehab OT Goals Patient Stated Goal: Pt wishes to go to SNF OT Goal Formulation: With patient Time For Goal Achievement: 06/01/17 Potential to Achieve Goals: Good  Plan Discharge plan remains appropriate                    AM-PAC PT "6 Clicks" Daily Activity     Outcome Measure   Help from another person eating meals?: None Help from another person taking care of personal grooming?: A Little Help from another person toileting, which includes using toliet, bedpan, or urinal?: A Little Help from another person bathing (including washing, rinsing, drying)?: A Little Help from another person to put on and taking off regular upper body clothing?: None Help from another person to put on and taking off regular lower body clothing?: A Little 6 Click Score: 20    End of Session Equipment Utilized During Treatment: Gait belt;Rolling walker  OT Visit Diagnosis: Other abnormalities of gait and mobility (R26.89);Unsteadiness on feet (R26.81)   Activity Tolerance Patient tolerated treatment well   Patient Left in bed;with call bell/phone within reach;with family/visitor present   Nurse Communication Mobility status        Time: 1610-9604 OT Time Calculation (min): 30 min  Charges: OT General Charges $OT Visit: 1 Visit OT  Treatments $Self Care/Home Management : 23-37 mins  Laura Solis, OT Pager [phone] 05/19/2017    Laura Solis 05/19/2017, 11:39 AM

## 2017-05-19 NOTE — Discharge Instructions (Signed)
Walk daily, use your brace with ambulation. OK to shower. See Dr. Ophelia CharterYates [place] [facility] [facility].

## 2017-05-22 ENCOUNTER — Telehealth (INDEPENDENT_AMBULATORY_CARE_PROVIDER_SITE_OTHER): Payer: Self-pay | Admitting: Orthopaedic Surgery

## 2017-05-22 NOTE — Telephone Encounter (Signed)
I called and spoke with patient. The bandage that she went home with from the hospital had new blood on it yesterday evening. She and her daughter called and spoke with Dr. Ophelia CharterYates, who was on call, and states that he told her to change the bandage. She has been keeping an eye on it today and does not notice any blood coming through. I did advise patient that home health nurse would be there to see her tomorrow, and if they felt she should be seen, to call and I would work her into Dr. Ophelia CharterYates schedule.

## 2017-05-22 NOTE — Telephone Encounter (Signed)
[facility]. They are going to put patient on the schedule for tomorrow. She did state that patient had contacted them and was upset because her incision was opening and bleeding more and that she had called the office and was told that this was fine and to put another bandage over it. [facility]  I did not see a note in chart in regards to incision, so I am unsure who the patient spoke with. Will call and confirm with the patient.

## 2017-05-22 NOTE — Telephone Encounter (Signed)
[facility] called asking for a verbal nurse order for the patient. CB # [phone]

## 2017-05-25 ENCOUNTER — Ambulatory Visit (INDEPENDENT_AMBULATORY_CARE_PROVIDER_SITE_OTHER): Payer: Medicare Other | Admitting: Orthopaedic Surgery

## 2017-05-31 NOTE — Discharge Summary (Signed)
Patient ID: Laura Solis MRN: [id number] DOB/AGE: [birth year] 66 y.o.  Admit date: 05/17/2017 Discharge date: 05/31/2017  Admission Diagnoses:  Active Problems:   Lumbar stenosis   Discharge Diagnoses:  Active Problems:   Lumbar stenosis  status post Procedure(s): L3-L5 Fusion Revision For Migrated Cage, Removal of Migrated Cage, Cage Reposition  Past Medical History:  Diagnosis Date  . ADHD (attention deficit hyperactivity disorder)   . Anxiety    "from the fibromyalgia"  . Chronic lower back pain   . Fibromyalgia   . Headache    "bad one; monthly" (01/11/2017)  . Osteoarthritis    "all over" (01/11/2017)  . Pneumonia    "I've had it about 3 times" (01/11/2017)  . PONV (postoperative nausea and vomiting)     Surgeries: Procedure(s): L3-L5 Fusion Revision For Migrated Cage, Removal of Migrated Cage, Cage Reposition on 05/17/2017   Consultants:   Discharged Condition: Improved  Hospital Course: Laura Solis is an 67 y.o. female who was admitted 05/17/2017 for operative treatment of lumbar stenosis. Patient failed conservative treatments (please see the history and physical for the specifics) and had severe unremitting pain that affects sleep, daily activities and work/hobbies. After pre-op clearance, the patient was taken to the operating room on 05/17/2017 and underwent  Procedure(s): L3-L5 Fusion Revision For Migrated Cage, Removal of Migrated Cage, Cage Reposition.    Patient was given perioperative antibiotics:  Anti-infectives    Start     Dose/Rate Route Frequency Ordered Stop   05/17/17 1900  ceFAZolin (ANCEF) IVPB 1 g/50 mL premix     1 g 100 mL/hr over 30 Minutes Intravenous Every 8 hours 05/17/17 1833 05/18/17 0315   05/17/17 0956  ceFAZolin (ANCEF) 2-4 GM/100ML-% IVPB    Comments:  Lorenda Ishihara   : cabinet override      05/17/17 0956 05/17/17 1305   05/17/17 0951  ceFAZolin (ANCEF) IVPB 2g/100 mL premix     2 g 200 mL/hr over 30 Minutes  Intravenous On call to O.R. 05/17/17 0951 05/17/17 1310       Patient was given sequential compression devices and early ambulation to prevent DVT.   Patient benefited maximally from hospital stay and there were no complications. At the time of discharge, the patient was urinating/moving their bowels without difficulty, tolerating a regular diet, pain is controlled with oral pain medications and they have been cleared by PT/OT.   Recent vital signs: No data found.    Recent laboratory studies: No results for input(s): WBC, HGB, HCT, PLT, NA, K, CL, CO2, BUN, CREATININE, GLUCOSE, INR, CALCIUM in the last 72 hours.  Invalid input(s): PT, 2   Discharge Medications:   Allergies as of 05/19/2017      Reactions   Other Other (See Comments)   Anesthesia causes the patient to not be able to eat for a week or so after anesthesia. She states it makes all food tastes like salt.(given when had knee surgery)      Medication List    STOP taking these medications   ibuprofen 600 MG tablet Commonly known as:  ADVIL,MOTRIN   IBUPROFEN PM 200-25 MG Caps Generic drug:  Ibuprofen-Diphenhydramine HCl     TAKE these medications   ALPRAZolam 0.5 MG tablet Commonly known as:  XANAX Take 0.5 mg by mouth at bedtime.   amphetamine-dextroamphetamine 10 MG tablet Commonly known as:  ADDERALL Take 10 mg by mouth 2 (two) times daily.   ciprofloxacin 500 MG tablet Commonly known as:  CIPRO Take 1 tablet (500 mg total) by mouth 2 (two) times daily.   FLUoxetine 20 MG capsule Commonly known as:  PROZAC Take 20 mg by mouth at bedtime.   methocarbamol 500 MG tablet Commonly known as:  ROBAXIN Take 1 tablet (500 mg total) by mouth every 6 (six) hours as needed for muscle spasms.   oxyCODONE-acetaminophen 5-325 MG tablet Commonly known as:  ROXICET Take 1-2 tablets by mouth every 4 (four) hours as needed for severe pain.   Potassium Chloride ER 20 MEQ Tbcr Take 2 tablets daily (40 meq) by mouth  daily.   pramipexole 1 MG tablet Commonly known as:  MIRAPEX Take 1 mg by mouth at bedtime.   prednisoLONE acetate 1 % ophthalmic suspension Commonly known as:  PRED FORTE Place 1 drop into the left eye 2 (two) times daily.       Diagnostic Studies: Dg Chest 2 View  Result Date: 05/12/2017 CLINICAL DATA:  Low back pain.  Sinus infection and bronchitis. EXAM: CHEST  2 VIEW COMPARISON:  01/02/2017 FINDINGS: Both lungs are clear. Heart and mediastinum are within normal limits. Extensive degenerative changes in the thoracic spine demonstrated by disc space narrowing, bridging osteophytes and endplate changes. Surgical hardware in the lumbar spine. No large pleural effusions. Postsurgical changes in both shoulders with shortening of the lateral clavicles. IMPRESSION: No active cardiopulmonary disease. Extensive degenerative changes in the thoracic spine. Postsurgical changes in both shoulders. Electronically Signed   By: Richarda OverlieAdam  Henn M.D.   On: 05/12/2017 11:18   Dg Lumbar Spine 2-3 Views  Result Date: 05/17/2017 CLINICAL DATA:  Lumbar spine fusion revision. EXAM: DG C-ARM 61-120 MIN; LUMBAR SPINE - 2-3 VIEW COMPARISON:  01/11/2017 lumbar spine CT FLUOROSCOPY TIME:  C-arm fluoroscopic images were obtained intraoperatively and submitted for post operative interpretation. Please see the performing provider's procedural report for the fluoroscopy time utilized. FINDINGS: Two intraoperative spot fluoroscopic images of the lumbar spine are provided. Pedicle screws and interconnecting rods are present bilaterally from L3-L5. The interbody implant at L4-5 has been replaced. An interbody implant is again noted at L3-4. Tissue retractors are in place posteriorly. IMPRESSION: Intraoperative images during L3-L5 fusion revision. Electronically Signed   By: Sebastian AcheAllen  Grady M.D.   On: 05/17/2017 16:15   Dg C-arm 61-120 Min  Result Date: 05/17/2017 CLINICAL DATA:  Lumbar spine fusion revision. EXAM: DG C-ARM 61-120  MIN; LUMBAR SPINE - 2-3 VIEW COMPARISON:  01/11/2017 lumbar spine CT FLUOROSCOPY TIME:  C-arm fluoroscopic images were obtained intraoperatively and submitted for post operative interpretation. Please see the performing provider's procedural report for the fluoroscopy time utilized. FINDINGS: Two intraoperative spot fluoroscopic images of the lumbar spine are provided. Pedicle screws and interconnecting rods are present bilaterally from L3-L5. The interbody implant at L4-5 has been replaced. An interbody implant is again noted at L3-4. Tissue retractors are in place posteriorly. IMPRESSION: Intraoperative images during L3-L5 fusion revision. Electronically Signed   By: Sebastian AcheAllen  Grady M.D.   On: 05/17/2017 16:15      Follow-up Information    [facility].   Why:  A representative from [facility] ([facility] ) will contact you to arrange start date and time for your therapy. Contact information: [facility] [place] [id number] [phone]        Eldred MangesYates, Mark C, MD Follow up in 2 week(s).   Specialty:  Orthopedic Surgery Contact information: [address] [place] [address] [phone]  Discharge Plan:  discharge to home  Disposition:     Signed: Zonia Kief  05/31/2017, 1:50 PM

## 2017-06-01 ENCOUNTER — Encounter (INDEPENDENT_AMBULATORY_CARE_PROVIDER_SITE_OTHER): Payer: Self-pay | Admitting: Orthopaedic Surgery

## 2017-06-01 ENCOUNTER — Ambulatory Visit (INDEPENDENT_AMBULATORY_CARE_PROVIDER_SITE_OTHER): Payer: Medicare Other

## 2017-06-01 ENCOUNTER — Ambulatory Visit (INDEPENDENT_AMBULATORY_CARE_PROVIDER_SITE_OTHER): Payer: Medicare Other | Admitting: Orthopaedic Surgery

## 2017-06-01 VITALS — BP 139/82 | HR 79 | Ht 65.5 in | Wt 214.0 lb

## 2017-06-01 DIAGNOSIS — Z981 Arthrodesis status: Secondary | ICD-10-CM

## 2017-06-01 NOTE — Progress Notes (Signed)
   Post-Op Visit Note   Patient: Laura Solis           Date of Birth: 05/04/1951           MRN: [id number] Visit Date: 06/01/2017 PCP: Joaquin CourtsFavero, John Patrick, DO   Assessment & Plan: Postop lumbar revision surgery for cage that was migrated causing severe stenosis. She's noted good relief from her preop pain that she had with radicular symptoms.  Chief Complaint:  Chief Complaint  Patient presents with  . Lower Back - Routine Post Op   Visit Diagnoses:  1. S/P lumbar fusion     Plan:Recheck 5 weeks. We will obtain a AP and lateral x-ray on return.  Follow-Up Instructions: No Follow-up on file.   Orders:  Orders Placed This Encounter  Procedures  . XR Lumbar Spine 2-3 Views   No orders of the defined types were placed in this encounter.   Imaging: No results found.  PMFS History: Patient Active Problem List   Diagnosis Date Noted  . S/P lumbar fusion 01/26/2017  . Postoperative urinary retention 01/11/2017  . Lumbar stenosis 01/09/2017  . Osteoarthritis of right knee 10/03/2012  . Fibromyalgia syndrome 10/03/2012  . Obesity (BMI 30.0-34.9) 10/03/2012   Past Medical History:  Diagnosis Date  . ADHD (attention deficit hyperactivity disorder)   . Anxiety    "from the fibromyalgia"  . Chronic lower back pain   . Fibromyalgia   . Headache    "bad one; monthly" (01/11/2017)  . Osteoarthritis    "all over" (01/11/2017)  . Pneumonia    "I've had it about 3 times" (01/11/2017)  . PONV (postoperative nausea and vomiting)     No family history on file.  Past Surgical History:  Procedure Laterality Date  . BACK SURGERY    . CARPAL TUNNEL RELEASE Bilateral   . CYST EXCISION     behind rt ear,coxxyx  . DILATION AND CURETTAGE OF UTERUS    . SHOULDER OPEN ROTATOR CUFF REPAIR Bilateral   . TONSILLECTOMY    . TOTAL KNEE ARTHROPLASTY Right 10/02/2012   Procedure: TOTAL KNEE ARTHROPLASTY;  Surgeon: Valeria BatmanPeter W [name], MD;  [facility];  Service: Orthopedics;   Laterality: Right;  Right Total Knee Arthroplasty  . TRANSFORAMINAL LUMBAR INTERBODY FUSION (TLIF) WITH PEDICLE SCREW FIXATION 2 LEVEL Left 01/09/2017   L3-4, L4-5/notes 01/09/2017  . TUBAL LIGATION    . VAGINAL HYSTERECTOMY     Social History   Occupational History  . Not on file.   Social History Main Topics  . Smoking status: Never Smoker  . Smokeless tobacco: Never Used  . Alcohol use No  . Drug use: No  . Sexual activity: Not Currently

## 2017-07-06 ENCOUNTER — Ambulatory Visit (INDEPENDENT_AMBULATORY_CARE_PROVIDER_SITE_OTHER): Payer: Medicare Other

## 2017-07-06 ENCOUNTER — Ambulatory Visit (INDEPENDENT_AMBULATORY_CARE_PROVIDER_SITE_OTHER): Payer: Medicare Other | Admitting: Orthopaedic Surgery

## 2017-07-06 ENCOUNTER — Encounter (INDEPENDENT_AMBULATORY_CARE_PROVIDER_SITE_OTHER): Payer: Self-pay | Admitting: Orthopaedic Surgery

## 2017-07-06 VITALS — BP 94/63 | HR 85

## 2017-07-06 DIAGNOSIS — Z981 Arthrodesis status: Secondary | ICD-10-CM | POA: Diagnosis not present

## 2017-07-07 ENCOUNTER — Encounter (INDEPENDENT_AMBULATORY_CARE_PROVIDER_SITE_OTHER): Payer: Self-pay | Admitting: Orthopaedic Surgery

## 2017-07-07 NOTE — Progress Notes (Signed)
   Post-Op Visit Note   Patient: Laura Solis           Date of Birth: 02/20/1951           MRN: [id number] Visit Date: 07/06/2017 PCP: Joaquin CourtsFavero, John Patrick, DO   Assessment & Plan: Follow-up migrated cage into the epidural space.  She states she feels good she is using her brace.  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  Good relief of preop leg pain.  She is moving better still using her brace..  I will recheck her in 4 months.  Lateral lumbar flexion and extension view on return and AP view. She is off pain medication.  Patient had revision surgery due to cage migration( displacement of fixation device of lumbar spine) from the interbody space into the lumbar  canal .  Post revision cage remains in good position , she has good relief of pain and is neurologically intact.  Chief Complaint:  Chief Complaint  Patient presents with  . Lower Back - Routine Post Op   Visit Diagnoses:  1. S/P lumbar fusion     Plan: recheck 4 months. Resume work in January. xrays as above on return.   Follow-Up Instructions: Return in about 4 months (around 11/04/2017).   Orders:  Orders Placed This Encounter  Procedures  . XR Lumbar Spine 2-3 Views   No orders of the defined types were placed in this encounter.   Imaging: No results found.  PMFS History: Patient Active Problem List   Diagnosis Date Noted  . S/P lumbar fusion 01/26/2017  . Postoperative urinary retention 01/11/2017  . Lumbar stenosis 01/09/2017  . Osteoarthritis of right knee 10/03/2012  . Fibromyalgia syndrome 10/03/2012  . Obesity (BMI 30.0-34.9) 10/03/2012   Past Medical History:  Diagnosis Date  . ADHD (attention deficit hyperactivity disorder)   . Anxiety    "from the fibromyalgia"  . Chronic lower back pain   . Fibromyalgia   . Headache    "bad one; monthly" (01/11/2017)  . Osteoarthritis    "all over" (01/11/2017)  . Pneumonia    "I've had it about 3  times" (01/11/2017)  . PONV (postoperative nausea and vomiting)     No family history on file.  Past Surgical History:  Procedure Laterality Date  . BACK SURGERY    . CARPAL TUNNEL RELEASE Bilateral   . CYST EXCISION     behind rt ear,coxxyx  . DILATION AND CURETTAGE OF UTERUS    . SHOULDER OPEN ROTATOR CUFF REPAIR Bilateral   . TONSILLECTOMY    . TOTAL KNEE ARTHROPLASTY Right 10/02/2012   Procedure: TOTAL KNEE ARTHROPLASTY;  Surgeon: Valeria BatmanPeter W [name], MD;  [facility];  Service: Orthopedics;  Laterality: Right;  Right Total Knee Arthroplasty  . TRANSFORAMINAL LUMBAR INTERBODY FUSION (TLIF) WITH PEDICLE SCREW FIXATION 2 LEVEL Left 01/09/2017   L3-4, L4-5/notes 01/09/2017  . TUBAL LIGATION    . VAGINAL HYSTERECTOMY     Social History   Occupational History  . Not on file  Tobacco Use  . Smoking status: Never Smoker  . Smokeless tobacco: Never Used  Substance and Sexual Activity  . Alcohol use: No  . Drug use: No  . Sexual activity: Not Currently

## 2017-09-28 ENCOUNTER — Encounter (INDEPENDENT_AMBULATORY_CARE_PROVIDER_SITE_OTHER): Payer: Self-pay | Admitting: Orthopaedic Surgery

## 2017-09-28 ENCOUNTER — Telehealth (INDEPENDENT_AMBULATORY_CARE_PROVIDER_SITE_OTHER): Payer: Self-pay | Admitting: Radiology

## 2017-09-28 ENCOUNTER — Ambulatory Visit (INDEPENDENT_AMBULATORY_CARE_PROVIDER_SITE_OTHER): Payer: Medicare Other

## 2017-09-28 ENCOUNTER — Ambulatory Visit (INDEPENDENT_AMBULATORY_CARE_PROVIDER_SITE_OTHER): Payer: Medicare Other | Admitting: Orthopaedic Surgery

## 2017-09-28 VITALS — BP 143/86 | HR 91 | Ht 65.5 in | Wt 220.0 lb

## 2017-09-28 DIAGNOSIS — M79605 Pain in left leg: Secondary | ICD-10-CM

## 2017-09-28 NOTE — Progress Notes (Signed)
Office Visit Note   Patient: Laura Solis           Date of Birth: [birth year]           MRN: [id number] Visit Date: 09/28/2017              Requested by: Joaquin Courts, DO [address] [place], [place] [address] PCP: Joaquin Courts, DO   Assessment & Plan: Visit Diagnoses:  1. Pain in left leg     Plan: Patient had original 2 level fusion 01/09/2017.  Migration and had revision surgery 05/17/2017.  Recent fall 1 month ago when she landed on her back..  Patient has hardware loosening , cage migration not into the canal at L4-5. I asked her to put her brace back on and wear it and will have her see Dr. Otelia Sergeant for evaluation and treatment .  We discussed with her at length that this is a difficult problem require more surgery.  She is comfortable with sitting upright positioning left side hip and thigh.  Follow-Up Instructions: No Follow-up on file.   Orders:  Orders Placed This Encounter  Procedures  . XR HIP UNILAT W OR W/O PELVIS 2-3 VIEWS LEFT  . XR Lumbar Spine 2-3 Views   No orders of the defined types were placed in this encounter.     Procedures: No procedures performed   Clinical Data: No additional findings.   Subjective: Chief Complaint  Patient presents with  . 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  Left lateral hip pain left groin pain that radiates down to her knee since the fall 2 weeks ago and she has been ambulating with a cane.  Back no numbness or tingling below her knees.  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  Profen and is no longer using her brace.  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  Her bowel or bladder associated symptoms.  Or chills.  Review of Systems is updated unchanged from 05/19/17 office other than as mentioned above.   Objective: Vital Signs: BP (!) 143/86    Pulse 91   Ht 5' 5.5" (1.664 m)   Wt 220 lb (99.8 kg)   BMI 36.05 kg/m   Physical Exam  Constitutional: She is oriented to person, place, and time. She appears well-developed.  HENT:  Head: Normocephalic.  Right Ear: External ear normal.  Left Ear: External ear normal.  Eyes: Pupils are equal, round, and reactive to light.  Neck: No tracheal deviation present. No thyromegaly present.  Cardiovascular: Normal rate.  Pulmonary/Chest: Effort normal.  Abdominal: Soft.  Neurological: She is alert and oriented to person, place, and time.  Skin: Skin is warm and dry.  Psychiatric: She has a normal mood and affect. Her behavior is normal.    Ortho Exam lumbar incisions well-healed.  No sciatic notch tenderness.  She has 0 degrees internal rotation left hip with left groin and thigh pain.  It is over the trochanters.  Anterior tib gastrocsoleus is intact bilaterally.  Distal pulses are intact no calf tenderness.  No rash over exposed skin.  Specialty Comments:  No specialty comments available.  Imaging: Xr Hip Unilat W Or W/o Pelvis 2-3 Views Left  Result Date: 09/28/2017 Standing AP pelvis frog-leg left hip are obtained  and reviewed.  This shows mild joint narrowing with osteophytes consistent with mild osteoarthritis.  Normal no acute fracture. Impression:  Mild Osteoarthritis of both hips slightly worse on the left than right  Xr Lumbar Spine 2-3 Views  Result Date: 09/28/2017 AP and lateral lumbar spine x-rays are obtained and reviewed.  There is lucency around the screws in L5 the left L3 screw which is a new finding comparison 07/06/2017 images.  Left L3 screw also shows lucency.  L4-5 cage has migrated and tilted left side to the L4 endplate.  Both L5 screws appear loose.. Impression: Hardware loosening and L4-5 cage loosening with inferior erosion of L4 vertebra body, consistent with pseudoarthrosis.    PMFS History: Patient Active Problem List   Diagnosis Date Noted  . S/P  lumbar fusion 01/26/2017  . Postoperative urinary retention 01/11/2017  . Lumbar stenosis 01/09/2017  . Osteoarthritis of right knee 10/03/2012  . Fibromyalgia syndrome 10/03/2012  . Obesity (BMI 30.0-34.9) 10/03/2012   Past Medical History:  Diagnosis Date  . ADHD (attention deficit hyperactivity disorder)   . Anxiety    "from the fibromyalgia"  . Chronic lower back pain   . Fibromyalgia   . Headache    "bad one; monthly" (01/11/2017)  . Osteoarthritis    "all over" (01/11/2017)  . Pneumonia    "I've had it about 3 times" (01/11/2017)  . PONV (postoperative nausea and vomiting)     No family history on file.  Past Surgical History:  Procedure Laterality Date  . BACK SURGERY    . CARPAL TUNNEL RELEASE Bilateral   . CYST EXCISION     behind rt ear,coxxyx  . DILATION AND CURETTAGE OF UTERUS    . SHOULDER OPEN ROTATOR CUFF REPAIR Bilateral   . TONSILLECTOMY    . TOTAL KNEE ARTHROPLASTY Right 10/02/2012   Procedure: TOTAL KNEE ARTHROPLASTY;  Surgeon: Valeria BatmanPeter W [name], MD;  [facility];  Service: Orthopedics;  Laterality: Right;  Right Total Knee Arthroplasty  . TRANSFORAMINAL LUMBAR INTERBODY FUSION (TLIF) WITH PEDICLE SCREW FIXATION 2 LEVEL Left 01/09/2017   L3-4, L4-5/notes 01/09/2017  . TUBAL LIGATION    . VAGINAL HYSTERECTOMY     Social History   Occupational History  . Not on file  Tobacco Use  . Smoking status: Never Smoker  . Smokeless tobacco: Never Used  Substance and Sexual Activity  . Alcohol use: No  . Drug use: No  . Sexual activity: Not Currently

## 2017-09-28 NOTE — Telephone Encounter (Signed)
Dr Ophelia CharterYates would like for Dr. Otelia SergeantNitka to evaluate and see patient. Could you please make her an appointment? She has had lumbar spine surgery x 2 (second for a migrated cage).  She has recently fallen and has lucency around her cage. I explained to patient that I would send you a message to schedule.  Thanks.

## 2017-10-04 NOTE — Telephone Encounter (Signed)
Patient has been scheduled for 10/30/2017 @ 130 to see Dr. Otelia SergeantNitka

## 2017-10-30 ENCOUNTER — Ambulatory Visit (INDEPENDENT_AMBULATORY_CARE_PROVIDER_SITE_OTHER): Payer: Medicare Other | Admitting: Specialist

## 2017-10-30 ENCOUNTER — Ambulatory Visit (INDEPENDENT_AMBULATORY_CARE_PROVIDER_SITE_OTHER): Payer: Medicare Other

## 2017-10-30 ENCOUNTER — Encounter (INDEPENDENT_AMBULATORY_CARE_PROVIDER_SITE_OTHER): Payer: Self-pay | Admitting: Specialist

## 2017-10-30 VITALS — BP 130/84 | HR 85 | Ht 65.5 in | Wt 210.0 lb

## 2017-10-30 DIAGNOSIS — Z981 Arthrodesis status: Secondary | ICD-10-CM

## 2017-10-30 DIAGNOSIS — M960 Pseudarthrosis after fusion or arthrodesis: Secondary | ICD-10-CM

## 2017-10-30 DIAGNOSIS — M85852 Other specified disorders of bone density and structure, left thigh: Secondary | ICD-10-CM | POA: Diagnosis not present

## 2017-10-30 DIAGNOSIS — T84498A Other mechanical complication of other internal orthopedic devices, implants and grafts, initial encounter: Secondary | ICD-10-CM

## 2017-10-30 DIAGNOSIS — M85851 Other specified disorders of bone density and structure, right thigh: Secondary | ICD-10-CM

## 2017-10-30 NOTE — Progress Notes (Signed)
Office Visit Note   Patient: Laura Solis           Date of Birth: 08/13/1950           MRN: [id number] Visit Date: 10/30/2017              Requested by: Joaquin CourtsFavero, John Patrick, DO [address] [place], [place] [id number] PCP: Joaquin CourtsFavero, John Patrick, DO   Assessment & Plan: Visit Diagnoses:  1. S/P lumbar fusion   2. 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HCC)   3. Pseudarthrosis after fusion or arthrodesis     Plan: Avoid bending, stooping and avoid lifting weights greater than 10 lbs. Avoid prolong standing and walking. Avoid frequent bending and stooping  No lifting greater than 10 lbs. May use ice or moist heat for pain. Weight loss is of benefit. Handicap license is approved. Lumbar myelogram and post myelogram CT scan is ordered. Bone density test is ordered. Vitamin D level ordered.   Follow-Up Instructions: Return in about 1 month (around 11/27/2017).   Orders:  Orders Placed This Encounter  Procedures  . XR Lumbar Spine 2-3 Views   No orders of the defined types were placed in this encounter.     Procedures: No procedures performed   Clinical Data: No additional findings.   Subjective: Chief Complaint  Patient presents with  . Lower Back - Follow-up    2nd opinion from Dr. Ophelia CharterYates, history of 2 back surgeries in last 1410 months    67 year old female with 10 month post op TLIFs at L3-4 and L4-5 with cages and pedicle screws and rods. Preoperatively she was having difficulty standing and walking. Could only make it to the mail box and back, was leaning on to the cart. Prior to the surgery with pain in the left leg just to about the knee. History of previous right TKR in the past with left sided knee arthritis and right hip degenerative joint disease.  Also has left shoulder DJD. [place]. 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 Feels like the left leg is coming from the knee. The left knee pain comes and goes, just walking she would have to sit to relieve the right leg pain. The left knee pain was okay. [place]. She has had a history of recurring pain into the back for at least 18 years. Dr. Krista BlueSinger Neurosurgeon thought she would likely need surgery. Dr. Ophelia CharterYates refrred her for evaluation of her spine as she is having subsidence of the cages with possible loosening.   Review of Systems  Constitutional: Negative for activity change, appetite change, chills, diaphoresis, fatigue, fever and unexpected weight change.  HENT: Positive for congestion, postnasal drip, rhinorrhea, sinus pressure and sinus pain. Negative for dental problem, drooling, ear discharge, ear pain, facial swelling, hearing loss, mouth sores, nosebleeds, sore throat, tinnitus, trouble swallowing and voice change.   Respiratory: Positive for cough. Negative for apnea, choking, chest tightness, shortness of breath, wheezing and stridor.   Cardiovascular: Negative for chest pain, palpitations and leg swelling.  Gastrointestinal: Negative for abdominal distention, abdominal pain, anal bleeding, blood in  stool, constipation, diarrhea, nausea, rectal pain and vomiting.  Endocrine: Negative for cold intolerance, heat intolerance, polydipsia, polyphagia and polyuria.  Genitourinary: Negative for difficulty urinating, dyspareunia, dysuria, enuresis, flank pain, frequency, genital sores and hematuria.  Musculoskeletal: Positive for back pain, gait problem, neck pain and neck stiffness. Negative for arthralgias, joint swelling and myalgias.  Skin: Negative for color change, pallor, rash and wound.  Allergic/Immunologic: Negative for environmental allergies, food allergies and  immunocompromised state.  Neurological: Positive for weakness and numbness. Negative for dizziness, tremors, seizures, syncope, facial asymmetry, speech difficulty, light-headedness and headaches.  Hematological: Negative for adenopathy. Does not bruise/bleed easily.  Psychiatric/Behavioral: Negative for agitation, behavioral problems, confusion, decreased concentration, dysphoric mood, hallucinations, self-injury, sleep disturbance and suicidal ideas. The patient is not nervous/anxious and is not hyperactive.      Objective: Vital Signs: BP 130/84 (BP Location: Left Arm, Patient Position: Sitting)   Pulse 85   Ht 5' 5.5" (1.664 m)   Wt 210 lb (95.3 kg)   BMI 34.41 kg/m   Physical Exam  Constitutional: She is oriented to person, place, and time. She appears well-developed and well-nourished.  HENT:  Head: Normocephalic and atraumatic.  Eyes: Pupils are equal, round, and reactive to light. EOM are normal.  Neck: Normal range of motion. Neck supple.  Pulmonary/Chest: Effort normal and breath sounds normal.  Abdominal: Soft. Bowel sounds are normal.  Neurological: She is alert and oriented to person, place, and time.  Skin: Skin is warm and dry.  Psychiatric: She has a normal mood and affect. Her behavior is normal. Judgment and thought content normal.    Back Exam   Tenderness  The patient is experiencing tenderness in the lumbar.  Range of Motion  Extension: abnormal  Flexion:  70 abnormal  Lateral bend right: abnormal  Lateral bend left: abnormal  Rotation right: abnormal  Rotation left: abnormal   Muscle Strength  Right Quadriceps:  5/5  Left Quadriceps:  5/5  Right Hamstrings:  5/5  Left Hamstrings:  5/5   Tests  Straight leg raise right: negative Straight leg raise left: negative  Reflexes  Patellar:  1/4 abnormal Achilles: normal Biceps: normal Babinski's sign: normal   Other  Toe walk: normal Heel walk: normal Sensation: normal Gait: normal  Scars:  present      Specialty Comments:  No specialty comments available.  Imaging: No results found.   PMFS History: Patient Active Problem List   Diagnosis Date Noted  . S/P lumbar fusion 01/26/2017  . Postoperative urinary retention 01/11/2017  . Lumbar stenosis 01/09/2017  . Osteoarthritis of right knee 10/03/2012  . Fibromyalgia syndrome 10/03/2012  . Obesity (BMI 30.0-34.9) 10/03/2012   Past Medical History:  Diagnosis Date  . ADHD (attention deficit hyperactivity disorder)   . Anxiety    "from the fibromyalgia"  . Chronic lower back pain   . Fibromyalgia   . Headache    "bad one; monthly" (01/11/2017)  . Osteoarthritis    "all over" (01/11/2017)  . Pneumonia    "I've had it about 3 times" (01/11/2017)  . PONV (postoperative nausea and vomiting)     No family history on file.  Past Surgical History:  Procedure Laterality Date  . BACK SURGERY    . CARPAL TUNNEL RELEASE Bilateral   . CYST EXCISION     behind rt ear,coxxyx  . DILATION AND CURETTAGE OF UTERUS    . SHOULDER OPEN ROTATOR CUFF REPAIR Bilateral   . TONSILLECTOMY    . TOTAL KNEE ARTHROPLASTY  Right 10/02/2012   Procedure: TOTAL KNEE ARTHROPLASTY;  Surgeon: Valeria Batman, MD;  Location: [facility] OR;  Service: Orthopedics;  Laterality: Right;  Right Total Knee Arthroplasty  . TRANSFORAMINAL LUMBAR INTERBODY FUSION (TLIF) WITH PEDICLE SCREW FIXATION 2 LEVEL Left 01/09/2017   L3-4, L4-5/notes 01/09/2017  . TUBAL LIGATION    . VAGINAL HYSTERECTOMY     Social History   Occupational History  . Not on file  Tobacco Use  . Smoking status: Never Smoker  . Smokeless tobacco: Never Used  Substance and Sexual Activity  . Alcohol use: No  . Drug use: No  . Sexual activity: Not Currently

## 2017-10-30 NOTE — Patient Instructions (Signed)
Avoid bending, stooping and avoid lifting weights greater than 10 lbs. Avoid prolong standing and walking. Avoid frequent bending and stooping  No lifting greater than 10 lbs. May use ice or moist heat for pain. Weight loss is of benefit. Handicap license is approved. Lumbar myelogram and post myelogram CT scan is ordered. Bone density test is ordered. Vitamin D level ordered.

## 2017-11-01 LAB — EXTRA LAV TOP TUBE

## 2017-11-01 LAB — VITAMIN D 1,25 DIHYDROXY
VITAMIN D 1, 25 (OH) TOTAL: 29 pg/mL (ref 18–72)
Vitamin D3 1, 25 (OH)2: 29 pg/mL

## 2017-11-01 LAB — VITAMIN D 25 HYDROXY (VIT D DEFICIENCY, FRACTURES): Vit D, 25-Hydroxy: 17 ng/mL — ABNORMAL LOW (ref 30–100)

## 2017-11-09 ENCOUNTER — Ambulatory Visit (INDEPENDENT_AMBULATORY_CARE_PROVIDER_SITE_OTHER): Payer: Medicare Other | Admitting: Orthopaedic Surgery

## 2017-11-16 ENCOUNTER — Ambulatory Visit
Admission: RE | Admit: 2017-11-16 | Discharge: 2017-11-16 | Disposition: A | Discharge status: Home / Self Care | Payer: Medicare Other | Source: Ambulatory Visit | Attending: Specialist | Admitting: Specialist

## 2017-11-16 DIAGNOSIS — T84498A Other mechanical complication of other internal orthopedic devices, implants and grafts, initial encounter: Secondary | ICD-10-CM

## 2017-11-16 DIAGNOSIS — M960 Pseudarthrosis after fusion or arthrodesis: Secondary | ICD-10-CM

## 2017-11-16 MED ORDER — DIAZEPAM 5 MG PO TABS
5.0000 mg | ORAL_TABLET | Freq: Once | ORAL | Status: AC
  Administered 2017-11-16: 5 mg via ORAL

## 2017-11-16 MED ORDER — IOPAMIDOL (ISOVUE-M 200) INJECTION 41%
15.0000 mL | Freq: Once | INTRAMUSCULAR | Status: AC
  Administered 2017-11-16: 15 mL via INTRATHECAL

## 2017-11-16 NOTE — Progress Notes (Signed)
Patient states she has been off Adderall and Prozac for at least the past two days.  Donell SievertJeanne Domnick Chervenak, RN

## 2017-11-16 NOTE — Discharge Instructions (Signed)
Myelogram Discharge Instructions  1. Go home and rest quietly for the next 24 hours.  It is important to lie flat for the next 24 hours.  Get up only to go to the restroom.  You may lie in the bed or on a couch on your back, your stomach, your left side or your right side.  You may have one pillow under your head.  You may have pillows between your knees while you are on your side or under your knees while you are on your back.  2. DO NOT drive today.  Recline the seat as far back as it will go, while still wearing your seat belt, on the way home.  3. You may get up to go to the bathroom as needed.  You may sit up for 10 minutes to eat.  You may resume your normal diet and medications unless otherwise indicated.  Drink lots of extra fluids today and tomorrow.  4. The incidence of headache, nausea, or vomiting is about 5% (one in 20 patients).  If you develop a headache, lie flat and drink plenty of fluids until the headache goes away.  Caffeinated beverages may be helpful.  If you develop severe nausea and vomiting or a headache that does not go away with flat bed rest, call [phone].  5. You may resume normal activities after your 24 hours of bed rest is over; however, do not exert yourself strongly or do any heavy lifting tomorrow. If when you get up you have a headache when standing, go back to bed and force fluids for another 24 hours.  6. Call your physician for a follow-up appointment.  The results of your myelogram will be sent directly to your physician by the following day.  7. If you have any questions or if complications develop after you arrive home, please call [phone].  Discharge instructions have been explained to the patient.  The patient, or the person responsible for the patient, fully understands these instructions.  YOU MAY RESTART YOUR ADDERALL AND PROZAC TOMORROW 11/17/2017 AT 1:00PM.

## 2017-11-20 ENCOUNTER — Ambulatory Visit (INDEPENDENT_AMBULATORY_CARE_PROVIDER_SITE_OTHER): Payer: Medicare Other | Admitting: Specialist

## 2017-11-20 ENCOUNTER — Encounter (INDEPENDENT_AMBULATORY_CARE_PROVIDER_SITE_OTHER): Payer: Self-pay | Admitting: Specialist

## 2017-11-20 VITALS — BP 142/79 | HR 95 | Ht 65.5 in | Wt 210.0 lb

## 2017-11-20 DIAGNOSIS — T84498A Other mechanical complication of other internal orthopedic devices, implants and grafts, initial encounter: Secondary | ICD-10-CM | POA: Diagnosis not present

## 2017-11-20 DIAGNOSIS — M960 Pseudarthrosis after fusion or arthrodesis: Secondary | ICD-10-CM

## 2017-11-20 DIAGNOSIS — M4326 Fusion of spine, lumbar region: Secondary | ICD-10-CM

## 2017-11-20 NOTE — Patient Instructions (Signed)
Avoid bending, stooping and avoid lifting weights greater than 10 lbs. Avoid prolong standing and walking. Order for a new walker with wheels. Surgery scheduling secretary Tivis RingerSherri Billings, will call you in the next week to schedule for surgery.  Surgery recommended is a one level lumbar redo fusion L4-5 and this would be done with revision of rods, L5 screws with probable cement fixation of the screws.and  Replacement of left L4-5 cage and placement of a new cage right L4-5 with local bone graft and allograft (donor bone graft). Take hydrocodone for for pain. Risk of surgery includes risk of infection 1 in 300 patients, bleeding 1/2% chance you would need a transfusion.   Risk to the nerves is one in 10,000. 5-10% chance of dura tear.  You will need to use a brace for 3 months and wean from the brace on the 4th month. Expect improved walking and standing tolerance. Expect relief of leg pain but numbness may persist depending on the length and degree of pressure that has been present.

## 2017-11-20 NOTE — Progress Notes (Signed)
Office Visit Note   Patient: Laura Solis           Date of Birth: Jul 14, 1951           MRN: [id number] Visit Date: 11/20/2017              Requested by: Joaquin Courts, DO [address] [place], [place] [address] PCP: Joaquin Courts, DO   Assessment & Plan: Visit Diagnoses:  1. Fusion of spine of lumbar region   2. Pseudarthrosis after fusion or arthrodesis   3. Loosening of hardware in spine [facility])     Plan: Avoid bending, stooping and avoid lifting weights greater than 10 lbs. Avoid prolong standing and walking. Order for a new walker with wheels. Surgery scheduling secretary Tivis Ringer, will call you in the next week to schedule for surgery.  Surgery recommended is a one level lumbar redo fusion L4-5 and this would be done with revision of rods, L5 screws with probable cement fixation of the screws.and  Replacement of left L4-5 cage and placement of a new cage right L4-5 with local bone graft and allograft (donor bone graft). Take hydrocodone for for pain. Risk of surgery includes risk of infection 1 in 300 patients, bleeding 1/2% chance you would need a transfusion.   Risk to the nerves is one in 10,000. 5-10% chance of dura tear.  You will need to use a brace for 3 months and wean from the brace on the 4th month. Expect improved walking and standing tolerance. Expect relief of leg pain but numbness may persist depending on the length and degree of pressure that has been present.  Follow-Up Instructions: Return in about 3 weeks (around 12/11/2017).   Orders:  No orders of the defined types were placed in this encounter.  No orders of the defined types were placed in this encounter.     Procedures: No procedures performed   Clinical Data: No additional findings.   Subjective: Chief Complaint  Patient presents with  . Lower Back - Follow-up    HPI  Review of Systems  Constitutional: Negative.   HENT: Negative.   Eyes: Negative.     Respiratory: Negative.   Cardiovascular: Negative.   Gastrointestinal: Negative.   Endocrine: Negative.   Genitourinary: Negative.   Musculoskeletal: Positive for back pain and gait problem. Negative for arthralgias, joint swelling, myalgias, neck pain and neck stiffness.  Skin: Negative for color change, pallor, rash and wound.  Neurological: Positive for weakness and numbness. Negative for dizziness, tremors, seizures, syncope, facial asymmetry, speech difficulty, light-headedness and headaches.  Hematological: Negative for adenopathy. Does not bruise/bleed easily.  Psychiatric/Behavioral: Negative for agitation, behavioral problems, confusion, decreased concentration, dysphoric mood, hallucinations, self-injury, sleep disturbance and suicidal ideas. The patient is not nervous/anxious and is not hyperactive.      Objective: Vital Signs: BP (!) 142/79 (BP Location: Left Arm, Patient Position: Sitting)   Pulse 95   Ht 5' 5.5" (1.664 m)   Wt 210 lb (95.3 kg)   BMI 34.41 kg/m   Physical Exam  Ortho Exam  Specialty Comments:  No specialty comments available.  Imaging: No results found.   PMFS History: Patient Active Problem List   Diagnosis Date Noted  . S/P lumbar fusion 01/26/2017  . Postoperative urinary retention 01/11/2017  . Lumbar stenosis 01/09/2017  . Restless legs 11/14/2016  . Osteoarthritis 11/14/2016  . 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 Attention deficit hyperactivity disorder 11/14/2016  . Anxiety 11/14/2016  .  Adult attention deficit hyperactivity disorder 11/14/2016  . Osteoarthritis of right knee 10/03/2012  . Fibromyalgia syndrome 10/03/2012  . Obesity (BMI 30.0-34.9) 10/03/2012   Past Medical History:  Diagnosis Date  . ADHD (attention deficit hyperactivity disorder)   . Anxiety    "from the fibromyalgia"  . Chronic lower back pain   . Fibromyalgia   . Headache    "bad one; monthly" (01/11/2017)  . Osteoarthritis    "all  over" (01/11/2017)  . Pneumonia    "I've had it about 3 times" (01/11/2017)  . PONV (postoperative nausea and vomiting)     History reviewed. No pertinent family history.  Past Surgical History:  Procedure Laterality Date  . BACK SURGERY    . CARPAL TUNNEL RELEASE Bilateral   . CYST EXCISION     behind rt ear,coxxyx  . DILATION AND CURETTAGE OF UTERUS    . SHOULDER OPEN ROTATOR CUFF REPAIR Bilateral   . TONSILLECTOMY    . TOTAL KNEE ARTHROPLASTY Right 10/02/2012   Procedure: TOTAL KNEE ARTHROPLASTY;  Surgeon: Valeria BatmanPeter W [name], MD;  [facility];  Service: Orthopedics;  Laterality: Right;  Right Total Knee Arthroplasty  . TRANSFORAMINAL LUMBAR INTERBODY FUSION (TLIF) WITH PEDICLE SCREW FIXATION 2 LEVEL Left 01/09/2017   L3-4, L4-5/notes 01/09/2017  . TUBAL LIGATION    . VAGINAL HYSTERECTOMY     Social History   Occupational History  . Not on file  Tobacco Use  . Smoking status: Never Smoker  . Smokeless tobacco: Never Used  Substance and Sexual Activity  . Alcohol use: No  . Drug use: No  . Sexual activity: Not Currently

## 2017-12-14 ENCOUNTER — Encounter (INDEPENDENT_AMBULATORY_CARE_PROVIDER_SITE_OTHER): Payer: Self-pay | Admitting: Orthopaedic Surgery

## 2017-12-14 ENCOUNTER — Ambulatory Visit (INDEPENDENT_AMBULATORY_CARE_PROVIDER_SITE_OTHER): Payer: Medicare Other | Admitting: Orthopaedic Surgery

## 2017-12-14 VITALS — BP 155/89 | HR 85 | Ht 65.0 in | Wt 217.0 lb

## 2017-12-14 DIAGNOSIS — M7542 Impingement syndrome of left shoulder: Secondary | ICD-10-CM | POA: Diagnosis not present

## 2017-12-14 MED ORDER — METHYLPREDNISOLONE ACETATE 40 MG/ML IJ SUSP
40.0000 mg | INTRAMUSCULAR | Status: AC | PRN
  Administered 2017-12-14: 40 mg via INTRA_ARTICULAR

## 2017-12-14 MED ORDER — LIDOCAINE HCL 1 % IJ SOLN
0.5000 mL | INTRAMUSCULAR | Status: AC | PRN
  Administered 2017-12-14: .5 mL

## 2017-12-14 MED ORDER — BUPIVACAINE HCL 0.25 % IJ SOLN
4.0000 mL | INTRAMUSCULAR | Status: AC | PRN
  Administered 2017-12-14: 4 mL via INTRA_ARTICULAR

## 2017-12-14 NOTE — Progress Notes (Signed)
Office Visit Note   Patient: Laura Solis           Date of Birth: [birth year]           MRN: [id number] Visit Date: 12/14/2017              Requested by: Joaquin Courts, DO [address] [place], [place] [address] PCP: Joaquin Courts, DO   Assessment & Plan: Visit Diagnoses:  1. Impingement syndrome of left shoulder     Plan: Performed which she tolerated well.  She will let us know if she has persistent symptoms we can do imaging if she has persistent problems.  To be able to use a walker or lumbar revision surgery.  Follow-Up Instructions: No follow-ups on file.   Orders:  Orders Placed This Encounter  Procedures  . Large Joint Inj: L subacromial bursa   No orders of the defined types were placed in this encounter.     Procedures: Large Joint Inj: L subacromial bursa on 12/14/2017 2:33 PM Indications: pain Details: 22 G 1.5 in needle  Arthrogram: No  Medications: 4 mL bupivacaine 0.25 %; 40 mg methylPREDNISolone acetate 40 MG/ML; 0.5 mL lidocaine 1 % Outcome: tolerated well, no immediate complications Procedure, treatment alternatives, risks and benefits explained, specific risks discussed. Consent was given by the patient. Immediately prior to procedure a time out was called to verify the correct patient, procedure, equipment, support staff and site/side marked as required. Patient was prepped and draped in the usual sterile fashion.       Clinical Data: No additional findings.   Subjective: Chief Complaint  Patient presents with  . Left Shoulder - Pain    HPI 67 year old female seen with left shoulder pain.  She previously  had both shoulders operated on the past left was done in [place] in in about 1999.  She had go to the emergency room on 12/08/2017 due to severe pain she tolerates pain extremely well and has had problems with lumbar fusion with cage back out, revision surgery and still has some loose screws and migrated cage.  She  had been waiting on surgical scheduling  For her second  revision with Dr. Otelia Sergeant.  She states her shoulder is very painful she is requesting some treatment to give her some relief.  Does not recall any particular injury to her left shoulder.  She states currently with it in the sling her arm is painful enough that she does not think she could really use a walker.  Review of Systems reviewed updated unchanged from surgery last year other than as mentioned in HPI.   Objective: Vital Signs: BP (!) 155/89   Pulse 85   Ht  (1.651 m)   Wt 217 lb (98.4 kg)   BMI 36.11 kg/m   Physical Exam  Constitutional: She is oriented to person, place, and time. She appears well-developed.  HENT:  Head: Normocephalic.  Right Ear: External ear normal.  Left Ear: External ear normal.  Eyes: Pupils are equal, round, and reactive to light.  Neck: No tracheal deviation present. No thyromegaly present.  Cardiovascular: Normal rate.  Pulmonary/Chest: Effort normal.  Abdominal: Soft.  Neurological: She is alert and oriented to person, place, and time.  Skin: Skin is warm and dry.  Psychiatric: She has a normal mood and affect. Her behavior is normal.    Ortho Exam well-healed lumbar midline incision.  Saber posterior incision well-healed..  She has some first dorsal interosseous trace weakness  and abductor digit he minimi weakness more pronounced in the left with normal testing on the right hand.  Tibial tunnel no brachial plexus tenderness.  Negative Spurling.  Specialty Comments:  No specialty comments available.  Imaging: No results found.   PMFS History: Patient Active Problem List   Diagnosis Date Noted  . Impingement syndrome of left shoulder 12/14/2017  . S/P lumbar fusion 01/26/2017  . Postoperative urinary retention 01/11/2017  . Lumbar stenosis 01/09/2017  . Restless legs 11/14/2016  . Osteoarthritis 11/14/2016  . 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 Attention deficit  hyperactivity disorder 11/14/2016  . Anxiety 11/14/2016  . Adult attention deficit hyperactivity disorder 11/14/2016  . Osteoarthritis of right knee 10/03/2012  . Fibromyalgia syndrome 10/03/2012  . Obesity (BMI 30.0-34.9) 10/03/2012   Past Medical History:  Diagnosis Date  . ADHD (attention deficit hyperactivity disorder)   . Anxiety    "from the fibromyalgia"  . Chronic lower back pain   . Fibromyalgia   . Headache    "bad one; monthly" (01/11/2017)  . Osteoarthritis    "all over" (01/11/2017)  . Pneumonia    "I've had it about 3 times" (01/11/2017)  . PONV (postoperative nausea and vomiting)     No family history on file.  Past Surgical History:  Procedure Laterality Date  . BACK SURGERY    . CARPAL TUNNEL RELEASE Bilateral   . CYST EXCISION     behind rt ear,coxxyx  . DILATION AND CURETTAGE OF UTERUS    . SHOULDER OPEN ROTATOR CUFF REPAIR Bilateral   . TONSILLECTOMY    . TOTAL KNEE ARTHROPLASTY Right 10/02/2012   Procedure: TOTAL KNEE ARTHROPLASTY;  Surgeon: Valeria Batman, MD;  Location: [facility] OR;  Service: Orthopedics;  Laterality: Right;  Right Total Knee Arthroplasty  . TRANSFORAMINAL LUMBAR INTERBODY FUSION (TLIF) WITH PEDICLE SCREW FIXATION 2 LEVEL Left 01/09/2017   L3-4, L4-5/notes 01/09/2017  . TUBAL LIGATION    . VAGINAL HYSTERECTOMY     Social History   Occupational History  . Not on file  Tobacco Use  . Smoking status: Never Smoker  . Smokeless tobacco: Never Used  Substance and Sexual Activity  . Alcohol use: No  . Drug use: No  . Sexual activity: Not Currently

## 2017-12-19 ENCOUNTER — Telehealth (INDEPENDENT_AMBULATORY_CARE_PROVIDER_SITE_OTHER): Payer: Self-pay | Admitting: Orthopaedic Surgery

## 2017-12-19 ENCOUNTER — Telehealth (INDEPENDENT_AMBULATORY_CARE_PROVIDER_SITE_OTHER): Payer: Self-pay | Admitting: Radiology

## 2017-12-19 NOTE — Telephone Encounter (Signed)
Note faxed per patient request

## 2017-12-19 NOTE — Telephone Encounter (Signed)
Requested work note. Note faxed.

## 2017-12-19 NOTE — Telephone Encounter (Signed)
Patient left voicemail requesting return call.

## 2017-12-19 NOTE — Telephone Encounter (Signed)
Patient return to work note faxed [phone] Attn: Cordelia Pen. Patient out of work from 12/11/17 returned 12/18/17

## 2018-01-03 ENCOUNTER — Ambulatory Visit (INDEPENDENT_AMBULATORY_CARE_PROVIDER_SITE_OTHER): Payer: Medicare Other | Admitting: Surgery

## 2018-01-09 NOTE — Pre-Procedure Instructions (Signed)
PEGAH SEGEL  01/09/2018      [id number] [place], [place] - [address] [place] [address] Phone: [phone] Fax: [phone]    Your procedure is scheduled on 01/16/2018.  Report to [facility] Admitting at 0530 A.M.  Call this number if you have problems the morning of surgery:  [phone]   Remember:  Do not eat or drink after midnight the night before your surgery.   Continue all medications as directed by your physician except follow these medication instructions before surgery below    Take these medicines the morning of surgery with A SIP OF WATER: Valacyclovir (Valtrex) - if needed  7 days prior to surgery STOP taking any Aspirin (unless otherwise instructed by your surgeon), Aleve, Naproxen, Ibuprofen, Motrin, Advil, Goody's, BC's, all herbal medications, fish oil, and all vitamins     Do not wear jewelry, make-up or nail polish.  Do not wear lotions, powders, or perfumes, or deodorant.  Do not shave 48 hours prior to surgery.    Do not bring valuables to the hospital.  [facility] is not responsible for any belongings or valuables.  Hearing aids, eyeglasses, contacts, dentures or bridgework may not be worn into surgery.  Leave your suitcase in the car.  After surgery it may be brought to your room.  For patients admitted to the hospital, discharge time will be determined by your treatment team.  Patients discharged the day of surgery will not be allowed to drive home.   Name and phone number of your driver:    Special instructions:   [facility]- Preparing For Surgery  Before surgery, you can play an important role. Because skin is not sterile, your skin needs to be as free of germs as possible. You can reduce the number of germs on your skin by washing with CHG (chlorahexidine gluconate) Soap before surgery.  CHG is an antiseptic cleaner which kills germs and bonds with the skin to continue killing germs even  after washing.    Oral Hygiene is also important to reduce your risk of infection.  Remember - BRUSH YOUR TEETH THE MORNING OF SURGERY WITH YOUR REGULAR TOOTHPASTE  Please do not use if you have an allergy to CHG or antibacterial soaps. If your skin becomes reddened/irritated stop using the CHG.  Do not shave (including legs and underarms) for at least 48 hours prior to first CHG shower. It is OK to shave your face.  Please follow these instructions carefully.   1. Shower the NIGHT BEFORE SURGERY and the MORNING OF SURGERY with CHG.   2. If you chose to wash your hair, wash your hair first as usual with your normal shampoo.  3. After you shampoo, rinse your hair and body thoroughly to remove the shampoo.  4. Use CHG as you would any other liquid soap. You can apply CHG directly to the skin and wash gently with a scrungie or a clean washcloth.   5. Apply the CHG Soap to your body ONLY FROM THE NECK DOWN.  Do not use on open wounds or open sores. Avoid contact with your eyes, ears, mouth and genitals (private parts). Wash Face and genitals (private parts)  with your normal soap.  6. Wash thoroughly, paying special attention to the area where your surgery will be performed.  7. Thoroughly rinse your body with warm water from the neck down.  8. DO NOT shower/wash with your normal soap after using and  rinsing off the CHG Soap.  9. Pat yourself dry with a CLEAN TOWEL.  10. Wear CLEAN PAJAMAS to bed the night before surgery, wear comfortable clothes the morning of surgery  11. Place CLEAN SHEETS on your bed the night of your first shower and DO NOT SLEEP WITH PETS.    Day of Surgery: Shower as stated above. Do not apply any deodorants/lotions.  Please wear clean clothes to the hospital/surgery center.   Remember to brush your teeth WITH YOUR REGULAR TOOTHPASTE.    Please read over the following fact sheets that you were given.

## 2018-01-10 ENCOUNTER — Ambulatory Visit (INDEPENDENT_AMBULATORY_CARE_PROVIDER_SITE_OTHER): Payer: Medicare Other | Admitting: Surgery

## 2018-01-10 ENCOUNTER — Encounter (HOSPITAL_COMMUNITY)
Admission: RE | Admit: 2018-01-10 | Discharge: 2018-01-10 | Disposition: A | Discharge status: Home / Self Care | Payer: Medicare Other | Source: Ambulatory Visit | Attending: Specialist | Admitting: Specialist

## 2018-01-10 ENCOUNTER — Encounter (INDEPENDENT_AMBULATORY_CARE_PROVIDER_SITE_OTHER): Payer: Self-pay | Admitting: Surgery

## 2018-01-10 ENCOUNTER — Other Ambulatory Visit: Payer: Self-pay

## 2018-01-10 ENCOUNTER — Encounter (HOSPITAL_COMMUNITY): Payer: Self-pay

## 2018-01-10 DIAGNOSIS — M960 Pseudarthrosis after fusion or arthrodesis: Secondary | ICD-10-CM | POA: Diagnosis not present

## 2018-01-10 DIAGNOSIS — Z01812 Encounter for preprocedural laboratory examination: Secondary | ICD-10-CM | POA: Insufficient documentation

## 2018-01-10 DIAGNOSIS — Z01818 Encounter for other preprocedural examination: Secondary | ICD-10-CM | POA: Insufficient documentation

## 2018-01-10 DIAGNOSIS — T84498A Other mechanical complication of other internal orthopedic devices, implants and grafts, initial encounter: Secondary | ICD-10-CM

## 2018-01-10 HISTORY — DX: Unspecified rotator cuff tear or rupture of unspecified shoulder, not specified as traumatic: M75.100

## 2018-01-10 LAB — COMPREHENSIVE METABOLIC PANEL
ALK PHOS: 99 U/L (ref 38–126)
ALT: 25 U/L (ref 14–54)
AST: 31 U/L (ref 15–41)
Albumin: 3.5 g/dL (ref 3.5–5.0)
Anion gap: 10 (ref 5–15)
BILIRUBIN TOTAL: 0.7 mg/dL (ref 0.3–1.2)
BUN: 14 mg/dL (ref 6–20)
CALCIUM: 9.5 mg/dL (ref 8.9–10.3)
CO2: 28 mmol/L (ref 22–32)
CREATININE: 0.78 mg/dL (ref 0.44–1.00)
Chloride: 102 mmol/L (ref 101–111)
GFR calc Af Amer: >60 mL/min (ref >60)
Glucose, Bld: 129 mg/dL — ABNORMAL HIGH (ref 65–99)
POTASSIUM: 3.5 mmol/L (ref 3.5–5.1)
Sodium: 140 mmol/L (ref 135–145)
TOTAL PROTEIN: 7.1 g/dL (ref 6.5–8.1)

## 2018-01-10 LAB — APTT: aPTT: 28 seconds (ref 24–36)

## 2018-01-10 LAB — URINALYSIS, ROUTINE W REFLEX MICROSCOPIC
BILIRUBIN URINE: NEGATIVE
GLUCOSE, UA: NEGATIVE mg/dL
HGB URINE DIPSTICK: NEGATIVE
Ketones, ur: NEGATIVE mg/dL
Leukocytes, UA: NEGATIVE
NITRITE: NEGATIVE
PH: 5 (ref 5.0–8.0)
Protein, ur: NEGATIVE mg/dL
Specific Gravity, Urine: 1.019 (ref 1.005–1.030)

## 2018-01-10 LAB — TYPE AND SCREEN
ABO/RH(D): A POS
Antibody Screen: NEGATIVE

## 2018-01-10 LAB — CBC
HEMATOCRIT: 43.3 % (ref 36.0–46.0)
Hemoglobin: 13.8 g/dL (ref 12.0–15.0)
MCH: 31 pg (ref 26.0–34.0)
MCHC: 31.9 g/dL (ref 30.0–36.0)
MCV: 97.3 fL (ref 78.0–100.0)
Platelets: 265 10*3/uL (ref 150–400)
RBC: 4.45 MIL/uL (ref 3.87–5.11)
RDW: 13.2 % (ref 11.5–15.5)
WBC: 5.9 10*3/uL (ref 4.0–10.5)

## 2018-01-10 LAB — SURGICAL PCR SCREEN
MRSA, PCR: NEGATIVE
Staphylococcus aureus: POSITIVE — AB

## 2018-01-10 LAB — PROTIME-INR
INR: 1.02
Prothrombin Time: 13.3 seconds (ref 11.4–15.2)

## 2018-01-10 NOTE — Pre-Procedure Instructions (Signed)
Laura Solis  01/10/2018      [id number] - [place], [place] - [address] [place] [address] Phone: [phone] Fax: [phone]    Your procedure is scheduled on January 16, 2018.  Report to [facility] Admitting at 0530 A.M.  Call this number if you have problems the morning of surgery:  [phone]   Remember:  Nothing to eat or drink after midnight.                       Take these medicines the morning of surgery with A SIP OF WATER ALPRAZolam (XANAX) 0.5 MG tablet FLUoxetine (PROZAC) 20 MG capsule Potassium Chloride ER 20 MEQ TBCR pramipexole (MIRAPEX) 1 MG tablet   7 days prior to surgery STOP taking any Aspirin(unless otherwise instructed by your surgeon), Aleve, Naproxen, Ibuprofen, Motrin, Advil, Goody's, BC's, all herbal medications, fish oil, and all vitamins     Do not wear jewelry, make-up or nail polish.  Do not wear lotions, powders, or perfumes, or deodorant.  Do not shave 48 hours prior to surgery.  Men may shave face and neck.  Do not bring valuables to the hospital.  [facility] is not responsible for any belongings or valuables.  Eyeglasses, hearing aids, contacts, dentures or bridgework may not be worn into surgery.  Leave your suitcase in the car.  After surgery it may be brought to your room.  For patients admitted to the hospital, discharge time will be determined by your treatment team.  Patients discharged the day of surgery will not be allowed to drive home.    - Preparing For Surgery  Before surgery, you can play an important role. Because skin is not sterile, your skin needs to be as free of germs as possible. You can reduce the number of germs on your skin by washing with CHG (chlorahexidine gluconate) Soap before surgery.  CHG is an antiseptic cleaner which kills germs and bonds with the skin to continue killing germs even after washing.    Oral Hygiene is also important to reduce  your risk of infection.  Remember - BRUSH YOUR TEETH THE MORNING OF SURGERY WITH YOUR REGULAR TOOTHPASTE  Please do not use if you have an allergy to CHG or antibacterial soaps. If your skin becomes reddened/irritated stop using the CHG.  Do not shave (including legs and underarms) for at least 48 hours prior to first CHG shower. It is OK to shave your face.  Please follow these instructions carefully.   1. Shower the NIGHT BEFORE SURGERY and the MORNING OF SURGERY with CHG.   2. If you chose to wash your hair, wash your hair first as usual with your normal shampoo.  3. After you shampoo, rinse your hair and body thoroughly to remove the shampoo.  4. Use CHG as you would any other liquid soap. You can apply CHG directly to the skin and wash gently with a scrungie or a clean washcloth.   5. Apply the CHG Soap to your body ONLY FROM THE NECK DOWN.  Do not use on open wounds or open sores. Avoid contact with your eyes, ears, mouth and genitals (private parts). Wash Face and genitals (private parts)  with your normal soap.  6. Wash thoroughly, paying special attention to the area where your surgery will be performed.  7. Thoroughly rinse your body with warm water from the neck down.  8. DO NOT shower/wash with  your normal soap after using and rinsing off the CHG Soap.  9. Pat yourself dry with a CLEAN TOWEL.  10. Wear CLEAN PAJAMAS to bed the night before surgery, wear comfortable clothes the morning of surgery  11. Place CLEAN SHEETS on your bed the night of your first shower and DO NOT SLEEP WITH PETS.    Day of Surgery:  Do not apply any deodorants/lotions.  Please wear clean clothes to the hospital/surgery center.   Remember to brush your teeth WITH YOUR REGULAR TOOTHPASTE.   Please read over the following fact sheets that you were given.

## 2018-01-10 NOTE — H&P (Addendum)
Laura BoldJoan M Laube is an 67 y.o. female.   Chief Complaint: Low back pain and lower extremity radiculopathy HPI: Patient with history of L3-L5 loosening of hardware and L4-5 pseudoarthrosis and the above complaint presented to our office for preop evaluation.  Progressively worsening symptoms.  Failed conservative treatment.  Past Medical History:  Diagnosis Date  . ADHD (attention deficit hyperactivity disorder)   . Anxiety    "from the fibromyalgia"  . Chronic lower back pain   . Fibromyalgia   . Headache    "bad one; monthly" (01/11/2017)  . Osteoarthritis    "all over" (01/11/2017)  . Pneumonia    "I've had it about 3 times" (01/11/2017)  . PONV (postoperative nausea and vomiting)     Past Surgical History:  Procedure Laterality Date  . BACK SURGERY    . CARPAL TUNNEL RELEASE Bilateral   . CYST EXCISION     behind rt ear,coxxyx  . DILATION AND CURETTAGE OF UTERUS    . SHOULDER OPEN ROTATOR CUFF REPAIR Bilateral   . TONSILLECTOMY    . TOTAL KNEE ARTHROPLASTY Right 10/02/2012   Procedure: TOTAL KNEE ARTHROPLASTY;  Surgeon: Valeria BatmanPeter W [name], MD;  [facility];  Service: Orthopedics;  Laterality: Right;  Right Total Knee Arthroplasty  . TRANSFORAMINAL LUMBAR INTERBODY FUSION (TLIF) WITH PEDICLE SCREW FIXATION 2 LEVEL Left 01/09/2017   L3-4, L4-5/notes 01/09/2017  . TUBAL LIGATION    . VAGINAL HYSTERECTOMY      No family history on file. Social History:  reports that she has never smoked. She has never used smokeless tobacco. She reports that she does not drink alcohol or use drugs.  Allergies:  Allergies  Allergen Reactions  . Other Other (See Comments)    Anesthesia causes nausea and the patient to not be able to eat for a week or so after anesthesia. She states it makes all food tastes like salt.(given when had knee surgery)    No medications prior to admission.    No results found for this or any previous visit (from the past 48 hour(s)). No results found.  Review of  Systems  Constitutional: Negative.   HENT: Negative.   Respiratory: Positive for shortness of breath (Exertional).   Cardiovascular: Negative.   Gastrointestinal: Negative.   Genitourinary: Negative.   Musculoskeletal: Positive for back pain.  Skin: Negative.   Neurological: Positive for tingling.  Psychiatric/Behavioral: Negative.     There were no vitals taken for this visit. Physical Exam  Constitutional: She is oriented to person, place, and time. She appears well-nourished. No distress.  HENT:  Head: Normocephalic and atraumatic.  Eyes: Pupils are equal, round, and reactive to light. EOM are normal.  Cardiovascular: Normal rate.  Respiratory: Effort normal. No respiratory distress. She has no wheezes.  GI: Bowel sounds are normal. She exhibits no distension. There is no tenderness.  Musculoskeletal:  Negative logroll.  Pain with straight leg raise on left.  Bilateral calves nontender.  Gait antalgic.  Neurological: She is alert and oriented to person, place, and time.  Skin: Skin is warm and dry.  Psychiatric: She has a normal mood and affect.     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  Surgical procedure along with possible rehab/recovery time discussed.  All questions answered.  Zonia KiefJames Owens,  PA-C 01/10/2018, 10:31 AM

## 2018-01-10 NOTE — Progress Notes (Signed)
PCP - Ernst BreachJohn Favero, DO Cardiologist - patient denies  Chest x-ray - 05/12/2017 EKG - not indicated Stress Test - patient denies ECHO - patient denies Cardiac Cath -patient denies   Sleep Study - patient states she has had a sleep study done, results were inconclusive and needed a f/u for repeat testing. Patient has not followed up.   Blood Thinner Instructions: N/A Aspirin Instructions: N/A  Anesthesia review: N/A  Patient denies shortness of breath, fever, cough and chest pain at PAT appointment   Patient verbalized understanding of instructions that were given to them at the PAT appointment. Patient was also instructed that they will need to review over the PAT instructions again at home before surgery.

## 2018-01-10 NOTE — Progress Notes (Signed)
67 year old female with history of loosening hardware L3 L5 L4-5 pseudoarthrosis presents for preop evaluation.  Symptoms unchanged since last office visit.  We received preop medical clearance.  Full H&P placed in patient's chart.

## 2018-01-15 ENCOUNTER — Encounter (HOSPITAL_COMMUNITY): Payer: Self-pay | Admitting: Anesthesiology

## 2018-01-16 ENCOUNTER — Inpatient Hospital Stay (HOSPITAL_COMMUNITY)
Admission: RE | Disposition: A | Discharge status: Home Health | Payer: Self-pay | Source: Home / Self Care | Attending: Specialist

## 2018-01-16 ENCOUNTER — Inpatient Hospital Stay (HOSPITAL_COMMUNITY)
Admission: RE | Admit: 2018-01-16 | Discharge: 2018-01-20 | DRG: 454 | Disposition: A | Discharge status: Home Health | Payer: Medicare Other | Attending: Specialist | Admitting: Specialist

## 2018-01-16 ENCOUNTER — Inpatient Hospital Stay (HOSPITAL_COMMUNITY): Payer: Medicare Other

## 2018-01-16 ENCOUNTER — Inpatient Hospital Stay (HOSPITAL_COMMUNITY): Payer: Medicare Other | Admitting: Anesthesiology

## 2018-01-16 ENCOUNTER — Encounter (HOSPITAL_COMMUNITY): Payer: Self-pay

## 2018-01-16 DIAGNOSIS — F419 Anxiety disorder, unspecified: Secondary | ICD-10-CM | POA: Diagnosis present

## 2018-01-16 DIAGNOSIS — M797 Fibromyalgia: Secondary | ICD-10-CM | POA: Diagnosis present

## 2018-01-16 DIAGNOSIS — T84498A Other mechanical complication of other internal orthopedic devices, implants and grafts, initial encounter: Principal | ICD-10-CM | POA: Diagnosis present

## 2018-01-16 DIAGNOSIS — Z981 Arthrodesis status: Secondary | ICD-10-CM

## 2018-01-16 DIAGNOSIS — Y92234 Operating room of hospital as the place of occurrence of the external cause: Secondary | ICD-10-CM | POA: Diagnosis not present

## 2018-01-16 DIAGNOSIS — Y838 Other surgical procedures as the cause of abnormal reaction of the patient, or of later complication, without mention of misadventure at the time of the procedure: Secondary | ICD-10-CM | POA: Diagnosis not present

## 2018-01-16 DIAGNOSIS — M1990 Unspecified osteoarthritis, unspecified site: Secondary | ICD-10-CM | POA: Diagnosis present

## 2018-01-16 DIAGNOSIS — F909 Attention-deficit hyperactivity disorder, unspecified type: Secondary | ICD-10-CM | POA: Diagnosis present

## 2018-01-16 DIAGNOSIS — D62 Acute posthemorrhagic anemia: Secondary | ICD-10-CM | POA: Diagnosis not present

## 2018-01-16 DIAGNOSIS — Z96651 Presence of right artificial knee joint: Secondary | ICD-10-CM | POA: Diagnosis present

## 2018-01-16 DIAGNOSIS — M5416 Radiculopathy, lumbar region: Secondary | ICD-10-CM | POA: Diagnosis present

## 2018-01-16 DIAGNOSIS — Z419 Encounter for procedure for purposes other than remedying health state, unspecified: Secondary | ICD-10-CM

## 2018-01-16 DIAGNOSIS — G8929 Other chronic pain: Secondary | ICD-10-CM | POA: Diagnosis present

## 2018-01-16 DIAGNOSIS — M960 Pseudarthrosis after fusion or arthrodesis: Secondary | ICD-10-CM | POA: Diagnosis present

## 2018-01-16 DIAGNOSIS — G9731 Intraoperative hemorrhage and hematoma of a nervous system organ or structure complicating a nervous system procedure: Secondary | ICD-10-CM | POA: Diagnosis not present

## 2018-01-16 DIAGNOSIS — M48061 Spinal stenosis, lumbar region without neurogenic claudication: Secondary | ICD-10-CM | POA: Diagnosis present

## 2018-01-16 DIAGNOSIS — T84296A Other mechanical complication of internal fixation device of vertebrae, initial encounter: Secondary | ICD-10-CM | POA: Diagnosis present

## 2018-01-16 DIAGNOSIS — R7981 Abnormal blood-gas level: Secondary | ICD-10-CM

## 2018-01-16 SURGERY — POSTERIOR LUMBAR FUSION 1 WITH HARDWARE REMOVAL
Anesthesia: General | Site: Spine Lumbar

## 2018-01-16 MED ORDER — FENTANYL CITRATE (PF) 250 MCG/5ML IJ SOLN
INTRAMUSCULAR | Status: AC
  Filled 2018-01-16: qty 5

## 2018-01-16 MED ORDER — LACTATED RINGERS IV SOLN
INTRAVENOUS | Status: DC | PRN
  Administered 2018-01-16 (×3): via INTRAVENOUS

## 2018-01-16 MED ORDER — ROCURONIUM BROMIDE 100 MG/10ML IV SOLN
INTRAVENOUS | Status: DC | PRN
  Administered 2018-01-16: 20 mg via INTRAVENOUS
  Administered 2018-01-16: 10 mg via INTRAVENOUS
  Administered 2018-01-16: 50 mg via INTRAVENOUS

## 2018-01-16 MED ORDER — DEXTROSE 5 % IV SOLN
INTRAVENOUS | Status: DC | PRN
  Administered 2018-01-16: 12:00:00 via INTRAVENOUS
  Administered 2018-01-16: 50 ug/min via INTRAVENOUS

## 2018-01-16 MED ORDER — ONDANSETRON HCL 4 MG/2ML IJ SOLN
INTRAMUSCULAR | Status: DC | PRN
  Administered 2018-01-16: 4 mg via INTRAVENOUS

## 2018-01-16 MED ORDER — OXYCODONE HCL ER 10 MG PO T12A
10.0000 mg | EXTENDED_RELEASE_TABLET | Freq: Two times a day (BID) | ORAL | Status: DC
  Administered 2018-01-16 – 2018-01-20 (×7): 10 mg via ORAL
  Filled 2018-01-16 (×8): qty 1

## 2018-01-16 MED ORDER — ALUM & MAG HYDROXIDE-SIMETH 200-200-20 MG/5ML PO SUSP: 30.0000 mL | Freq: Four times a day (QID) | ORAL | Status: DC | PRN

## 2018-01-16 MED ORDER — LIDOCAINE 2% (20 MG/ML) 5 ML SYRINGE
INTRAMUSCULAR | Status: AC
  Filled 2018-01-16: qty 5

## 2018-01-16 MED ORDER — BUPIVACAINE HCL (PF) 0.5 % IJ SOLN
INTRAMUSCULAR | Status: AC
Start: 2018-01-16 — End: ?
  Filled 2018-01-16: qty 30

## 2018-01-16 MED ORDER — PHENOL 1.4 % MT LIQD: 1.0000 | OROMUCOSAL | Status: DC | PRN

## 2018-01-16 MED ORDER — HEMOSTATIC AGENTS (NO CHARGE) OPTIME
TOPICAL | Status: DC | PRN
  Administered 2018-01-16 (×2): 1 via TOPICAL

## 2018-01-16 MED ORDER — ACETAMINOPHEN 650 MG RE SUPP: 650.0000 mg | RECTAL | Status: DC | PRN

## 2018-01-16 MED ORDER — BUPIVACAINE LIPOSOME 1.3 % IJ SUSP
20.0000 mL | Freq: Once | INTRAMUSCULAR | Status: AC
  Administered 2018-01-16: 20 mL
  Filled 2018-01-16: qty 20

## 2018-01-16 MED ORDER — CEFAZOLIN SODIUM-DEXTROSE 2-4 GM/100ML-% IV SOLN
2.0000 g | INTRAVENOUS | Status: AC
  Administered 2018-01-16 (×2): 2 g via INTRAVENOUS
  Filled 2018-01-16: qty 100

## 2018-01-16 MED ORDER — ACETAMINOPHEN 325 MG PO TABS: 650.0000 mg | ORAL_TABLET | ORAL | Status: DC | PRN

## 2018-01-16 MED ORDER — ONDANSETRON HCL 4 MG/2ML IJ SOLN
INTRAMUSCULAR | Status: AC
  Filled 2018-01-16: qty 2

## 2018-01-16 MED ORDER — LACTATED RINGERS IV SOLN
INTRAVENOUS | Status: DC | PRN
  Administered 2018-01-16: 08:00:00 via INTRAVENOUS

## 2018-01-16 MED ORDER — EPHEDRINE SULFATE 50 MG/ML IJ SOLN
INTRAMUSCULAR | Status: AC
  Filled 2018-01-16: qty 1

## 2018-01-16 MED ORDER — SUGAMMADEX SODIUM 200 MG/2ML IV SOLN
INTRAVENOUS | Status: AC
  Filled 2018-01-16: qty 2

## 2018-01-16 MED ORDER — FLUOXETINE HCL 20 MG PO CAPS
20.0000 mg | ORAL_CAPSULE | Freq: Every day | ORAL | Status: DC
  Administered 2018-01-16 – 2018-01-19 (×4): 20 mg via ORAL
  Filled 2018-01-16 (×4): qty 1

## 2018-01-16 MED ORDER — PANTOPRAZOLE SODIUM 40 MG PO TBEC
40.0000 mg | DELAYED_RELEASE_TABLET | Freq: Every day | ORAL | Status: DC
  Administered 2018-01-16 – 2018-01-20 (×5): 40 mg via ORAL
  Filled 2018-01-16 (×5): qty 1

## 2018-01-16 MED ORDER — 0.9 % SODIUM CHLORIDE (POUR BTL) OPTIME
TOPICAL | Status: DC | PRN
  Administered 2018-01-16 (×4): 1000 mL

## 2018-01-16 MED ORDER — MIDAZOLAM HCL 5 MG/5ML IJ SOLN
INTRAMUSCULAR | Status: DC | PRN
  Administered 2018-01-16: 2 mg via INTRAVENOUS

## 2018-01-16 MED ORDER — AMPHETAMINE-DEXTROAMPHETAMINE 10 MG PO TABS
10.0000 mg | ORAL_TABLET | Freq: Two times a day (BID) | ORAL | Status: DC
  Administered 2018-01-17 – 2018-01-19 (×6): 10 mg via ORAL
  Filled 2018-01-16 (×7): qty 1

## 2018-01-16 MED ORDER — ROCURONIUM BROMIDE 50 MG/5ML IV SOLN
INTRAVENOUS | Status: AC
  Filled 2018-01-16: qty 1

## 2018-01-16 MED ORDER — SODIUM CHLORIDE 0.9% FLUSH
3.0000 mL | INTRAVENOUS | Status: DC | PRN
Start: 2018-01-16 — End: 2018-01-20

## 2018-01-16 MED ORDER — DEXAMETHASONE SODIUM PHOSPHATE 10 MG/ML IJ SOLN
INTRAMUSCULAR | Status: DC | PRN
  Administered 2018-01-16: 10 mg via INTRAVENOUS

## 2018-01-16 MED ORDER — LIDOCAINE HCL (CARDIAC) PF 100 MG/5ML IV SOSY
PREFILLED_SYRINGE | INTRAVENOUS | Status: DC | PRN
  Administered 2018-01-16: 60 mg via INTRAVENOUS

## 2018-01-16 MED ORDER — CEFAZOLIN SODIUM 1 G IJ SOLR
INTRAMUSCULAR | Status: AC
  Filled 2018-01-16: qty 20

## 2018-01-16 MED ORDER — FENTANYL CITRATE (PF) 100 MCG/2ML IJ SOLN
INTRAMUSCULAR | Status: DC | PRN
  Administered 2018-01-16: 50 ug via INTRAVENOUS
  Administered 2018-01-16: 25 ug via INTRAVENOUS
  Administered 2018-01-16 (×5): 50 ug via INTRAVENOUS
  Administered 2018-01-16: 25 ug via INTRAVENOUS
  Administered 2018-01-16: 50 ug via INTRAVENOUS
  Administered 2018-01-16 (×2): 25 ug via INTRAVENOUS

## 2018-01-16 MED ORDER — CHLORHEXIDINE GLUCONATE 4 % EX LIQD: 60.0000 mL | Freq: Once | CUTANEOUS | Status: DC

## 2018-01-16 MED ORDER — OXYCODONE HCL 5 MG PO TABS: 5.0000 mg | ORAL_TABLET | Freq: Once | ORAL | Status: DC | PRN

## 2018-01-16 MED ORDER — SODIUM CHLORIDE 0.9 % IV SOLN
INTRAVENOUS | Status: DC | PRN
  Administered 2018-01-16: 13:00:00 via INTRAVENOUS

## 2018-01-16 MED ORDER — ONDANSETRON HCL 4 MG/2ML IJ SOLN: 4.0000 mg | Freq: Four times a day (QID) | INTRAMUSCULAR | Status: DC | PRN

## 2018-01-16 MED ORDER — BISACODYL 5 MG PO TBEC
5.0000 mg | DELAYED_RELEASE_TABLET | Freq: Every day | ORAL | Status: DC | PRN
Start: 2018-01-16 — End: 2018-01-20

## 2018-01-16 MED ORDER — ALBUMIN HUMAN 5 % IV SOLN
INTRAVENOUS | Status: DC | PRN
  Administered 2018-01-16: 10:00:00 via INTRAVENOUS

## 2018-01-16 MED ORDER — POTASSIUM CHLORIDE CRYS ER 10 MEQ PO TBCR
20.0000 meq | EXTENDED_RELEASE_TABLET | Freq: Two times a day (BID) | ORAL | Status: DC
  Administered 2018-01-16 – 2018-01-20 (×8): 20 meq via ORAL
  Filled 2018-01-16 (×8): qty 2

## 2018-01-16 MED ORDER — VANCOMYCIN HCL 1000 MG IV SOLR
INTRAVENOUS | Status: DC | PRN
  Administered 2018-01-16: 1000 mg via TOPICAL

## 2018-01-16 MED ORDER — SCOPOLAMINE 1 MG/3DAYS TD PT72
MEDICATED_PATCH | TRANSDERMAL | Status: AC
  Filled 2018-01-16: qty 1

## 2018-01-16 MED ORDER — HYDROMORPHONE HCL 2 MG/ML IJ SOLN
INTRAMUSCULAR | Status: AC
  Filled 2018-01-16: qty 1

## 2018-01-16 MED ORDER — VANCOMYCIN HCL 1000 MG IV SOLR
INTRAVENOUS | Status: AC
  Filled 2018-01-16: qty 1000

## 2018-01-16 MED ORDER — HYDROMORPHONE HCL 2 MG/ML IJ SOLN
0.2500 mg | INTRAMUSCULAR | Status: DC | PRN
  Administered 2018-01-16 (×2): 0.5 mg via INTRAVENOUS

## 2018-01-16 MED ORDER — PROPOFOL 10 MG/ML IV BOLUS
INTRAVENOUS | Status: DC | PRN
  Administered 2018-01-16: 150 mg via INTRAVENOUS

## 2018-01-16 MED ORDER — MENTHOL 3 MG MT LOZG: 1.0000 | LOZENGE | OROMUCOSAL | Status: DC | PRN

## 2018-01-16 MED ORDER — POLYETHYLENE GLYCOL 3350 17 G PO PACK: 17.0000 g | PACK | Freq: Every day | ORAL | Status: DC | PRN

## 2018-01-16 MED ORDER — DEXAMETHASONE SODIUM PHOSPHATE 10 MG/ML IJ SOLN
INTRAMUSCULAR | Status: AC
  Filled 2018-01-16: qty 1

## 2018-01-16 MED ORDER — METHOCARBAMOL 1000 MG/10ML IJ SOLN
500.0000 mg | Freq: Four times a day (QID) | INTRAVENOUS | Status: DC | PRN
  Filled 2018-01-16: qty 5

## 2018-01-16 MED ORDER — KETOROLAC TROMETHAMINE 15 MG/ML IJ SOLN
7.5000 mg | Freq: Four times a day (QID) | INTRAMUSCULAR | Status: AC
  Administered 2018-01-16 – 2018-01-17 (×3): 7.5 mg via INTRAVENOUS
  Filled 2018-01-16 (×4): qty 1

## 2018-01-16 MED ORDER — FLEET ENEMA 7-19 GM/118ML RE ENEM: 1.0000 | ENEMA | Freq: Once | RECTAL | Status: DC | PRN

## 2018-01-16 MED ORDER — SODIUM CHLORIDE 0.9 % IJ SOLN
INTRAMUSCULAR | Status: AC
  Filled 2018-01-16: qty 10

## 2018-01-16 MED ORDER — OXYCODONE HCL 5 MG PO TABS: 5.0000 mg | ORAL_TABLET | ORAL | Status: DC | PRN

## 2018-01-16 MED ORDER — OXYCODONE HCL 5 MG/5ML PO SOLN: 5.0000 mg | Freq: Once | ORAL | Status: DC | PRN

## 2018-01-16 MED ORDER — OXYCODONE HCL 5 MG PO TABS
10.0000 mg | ORAL_TABLET | ORAL | Status: DC | PRN
  Filled 2018-01-16: qty 2

## 2018-01-16 MED ORDER — SUGAMMADEX SODIUM 200 MG/2ML IV SOLN
INTRAVENOUS | Status: DC | PRN
  Administered 2018-01-16: 200 mg via INTRAVENOUS

## 2018-01-16 MED ORDER — THROMBIN 20000 UNITS EX SOLR
CUTANEOUS | Status: DC | PRN
  Administered 2018-01-16: 20000 [IU] via TOPICAL

## 2018-01-16 MED ORDER — PRAMIPEXOLE DIHYDROCHLORIDE 1.5 MG PO TABS
1.5000 mg | ORAL_TABLET | Freq: Every day | ORAL | Status: DC
  Administered 2018-01-17 – 2018-01-19 (×3): 1.5 mg via ORAL
  Filled 2018-01-16 (×4): qty 1

## 2018-01-16 MED ORDER — ALPRAZOLAM 0.5 MG PO TABS
0.5000 mg | ORAL_TABLET | Freq: Every day | ORAL | Status: DC
  Administered 2018-01-16 – 2018-01-19 (×4): 0.5 mg via ORAL
  Filled 2018-01-16 (×4): qty 1

## 2018-01-16 MED ORDER — THROMBIN (RECOMBINANT) 20000 UNITS EX SOLR
CUTANEOUS | Status: AC
  Filled 2018-01-16: qty 20000

## 2018-01-16 MED ORDER — SODIUM CHLORIDE 0.9 % IV SOLN: 250.0000 mL | INTRAVENOUS | Status: DC

## 2018-01-16 MED ORDER — GABAPENTIN 300 MG PO CAPS
300.0000 mg | ORAL_CAPSULE | Freq: Three times a day (TID) | ORAL | Status: DC
  Administered 2018-01-16 – 2018-01-20 (×12): 300 mg via ORAL
  Filled 2018-01-16 (×12): qty 1

## 2018-01-16 MED ORDER — MIDAZOLAM HCL 2 MG/2ML IJ SOLN
INTRAMUSCULAR | Status: AC
  Filled 2018-01-16: qty 2

## 2018-01-16 MED ORDER — DOCUSATE SODIUM 100 MG PO CAPS
100.0000 mg | ORAL_CAPSULE | Freq: Two times a day (BID) | ORAL | Status: DC
  Administered 2018-01-17 – 2018-01-20 (×6): 100 mg via ORAL
  Filled 2018-01-16 (×7): qty 1

## 2018-01-16 MED ORDER — SODIUM CHLORIDE 0.9% FLUSH
3.0000 mL | Freq: Two times a day (BID) | INTRAVENOUS | Status: DC
  Administered 2018-01-17 – 2018-01-19 (×4): 3 mL via INTRAVENOUS

## 2018-01-16 MED ORDER — PHENYLEPHRINE HCL 10 MG/ML IJ SOLN
INTRAMUSCULAR | Status: DC | PRN
  Administered 2018-01-16 (×2): 120 ug via INTRAVENOUS
  Administered 2018-01-16 (×2): 80 ug via INTRAVENOUS

## 2018-01-16 MED ORDER — METHOCARBAMOL 500 MG PO TABS: 500.0000 mg | ORAL_TABLET | Freq: Four times a day (QID) | ORAL | Status: DC | PRN

## 2018-01-16 MED ORDER — SCOPOLAMINE 1 MG/3DAYS TD PT72
MEDICATED_PATCH | TRANSDERMAL | Status: DC | PRN
  Administered 2018-01-16: 1 via TRANSDERMAL

## 2018-01-16 MED ORDER — SODIUM CHLORIDE 0.9 % IV SOLN
INTRAVENOUS | Status: DC
  Administered 2018-01-16: 18:00:00 via INTRAVENOUS
  Administered 2018-01-17: 150 mL via INTRAVENOUS

## 2018-01-16 MED ORDER — VALACYCLOVIR HCL 500 MG PO TABS
1000.0000 mg | ORAL_TABLET | Freq: Two times a day (BID) | ORAL | Status: DC | PRN
  Filled 2018-01-16: qty 2

## 2018-01-16 MED ORDER — ONDANSETRON HCL 4 MG PO TABS: 4.0000 mg | ORAL_TABLET | Freq: Four times a day (QID) | ORAL | Status: DC | PRN

## 2018-01-16 MED ORDER — CEFAZOLIN SODIUM-DEXTROSE 2-4 GM/100ML-% IV SOLN
2.0000 g | Freq: Three times a day (TID) | INTRAVENOUS | Status: AC
  Administered 2018-01-16 – 2018-01-17 (×2): 2 g via INTRAVENOUS
  Filled 2018-01-16 (×2): qty 100

## 2018-01-16 MED ORDER — PROPOFOL 10 MG/ML IV BOLUS
INTRAVENOUS | Status: AC
  Filled 2018-01-16: qty 20

## 2018-01-16 MED ORDER — BUPIVACAINE HCL 0.5 % IJ SOLN
INTRAMUSCULAR | Status: DC | PRN
  Administered 2018-01-16: 30 mL

## 2018-01-16 MED ORDER — DIPHENHYDRAMINE HCL 25 MG PO CAPS
25.0000 mg | ORAL_CAPSULE | Freq: Every evening | ORAL | Status: DC | PRN
  Administered 2018-01-16: 25 mg via ORAL
  Filled 2018-01-16: qty 1

## 2018-01-16 SURGICAL SUPPLY — 75 items
BENZOIN TINCTURE PRP APPL 2/3 (GAUZE/BANDAGES/DRESSINGS) ×3 IMPLANT
BLADE CLIPPER SURG (BLADE) IMPLANT
BONE VIVIGEN FORMABLE 10CC (Bone Implant) ×3 IMPLANT
BUR MATCHSTICK NEURO 3.0 LAGG (BURR) ×3 IMPLANT
BUR SABER RD CUTTING 3.0 (BURR) IMPLANT
BUR SABER RD CUTTING 3.0MM (BURR)
CLOSURE STERI-STRIP 1/2X4 (GAUZE/BANDAGES/DRESSINGS) ×1
CLSR STERI-STRIP ANTIMIC 1/2X4 (GAUZE/BANDAGES/DRESSINGS) ×2 IMPLANT
CONNECTOR SFX SIZE A4 (Orthopedic Implant) ×3 IMPLANT
CONNECTOR Z ROD UNIVERSAL 5.5 (Connector) ×3 IMPLANT
CONT SPEC 4OZ CLIKSEAL STRL BL (MISCELLANEOUS) ×3 IMPLANT
COVER BACK TABLE 80X110 HD (DRAPES) ×3 IMPLANT
COVER MAYO STAND STRL (DRAPES) ×3 IMPLANT
COVER SURGICAL LIGHT HANDLE (MISCELLANEOUS) ×3 IMPLANT
DERMABOND ADVANCED (GAUZE/BANDAGES/DRESSINGS)
DERMABOND ADVANCED .7 DNX12 (GAUZE/BANDAGES/DRESSINGS) IMPLANT
DRAPE C-ARM 42X72 X-RAY (DRAPES) ×3 IMPLANT
DRAPE C-ARMOR (DRAPES) ×3 IMPLANT
DRAPE SURG 17X23 STRL (DRAPES) ×6 IMPLANT
DRSG MEPILEX BORDER 4X4 (GAUZE/BANDAGES/DRESSINGS) IMPLANT
DRSG MEPILEX BORDER 4X8 (GAUZE/BANDAGES/DRESSINGS) ×3 IMPLANT
DRSG PAD ABDOMINAL 8X10 ST (GAUZE/BANDAGES/DRESSINGS) IMPLANT
DURAPREP 26ML APPLICATOR (WOUND CARE) ×3 IMPLANT
ELECT BLADE 6.5 EXT (BLADE) ×3 IMPLANT
ELECT CAUTERY BLADE 6.4 (BLADE) ×3 IMPLANT
ELECT REM PT RETURN 9FT ADLT (ELECTROSURGICAL) ×3
ELECTRODE REM PT RTRN 9FT ADLT (ELECTROSURGICAL) ×1 IMPLANT
EVACUATOR 1/8 PVC DRAIN (DRAIN) ×3 IMPLANT
FLOSEAL 5ML (HEMOSTASIS) ×3 IMPLANT
GAUZE SPONGE 4X4 12PLY STRL (GAUZE/BANDAGES/DRESSINGS) ×3 IMPLANT
GAUZE SPONGE 4X4 12PLY STRL LF (GAUZE/BANDAGES/DRESSINGS) ×3 IMPLANT
GLOVE BIOGEL PI IND STRL 8 (GLOVE) ×2 IMPLANT
GLOVE BIOGEL PI INDICATOR 8 (GLOVE) ×4
GLOVE ECLIPSE 9.0 STRL (GLOVE) ×6 IMPLANT
GLOVE ORTHO TXT STRL SZ7.5 (GLOVE) ×3 IMPLANT
GLOVE SURG 8.5 LATEX PF (GLOVE) ×6 IMPLANT
GOWN STRL REUS W/ TWL LRG LVL3 (GOWN DISPOSABLE) ×2 IMPLANT
GOWN STRL REUS W/TWL 2XL LVL3 (GOWN DISPOSABLE) ×12 IMPLANT
GOWN STRL REUS W/TWL LRG LVL3 (GOWN DISPOSABLE) ×4
KIT BASIN OR (CUSTOM PROCEDURE TRAY) ×3 IMPLANT
KIT POSITION SURG JACKSON T1 (MISCELLANEOUS) ×3 IMPLANT
KIT TURNOVER KIT B (KITS) ×3 IMPLANT
NEEDLE 22X1 1/2 (OR ONLY) (NEEDLE) ×3 IMPLANT
NS IRRIG 1000ML POUR BTL (IV SOLUTION) ×12 IMPLANT
PACK LAMINECTOMY ORTHO (CUSTOM PROCEDURE TRAY) ×3 IMPLANT
PAD ARMBOARD 7.5X6 YLW CONV (MISCELLANEOUS) ×9 IMPLANT
PATTIES SURGICAL .75X.75 (GAUZE/BANDAGES/DRESSINGS) ×3 IMPLANT
ROD EXPEDIUM PREBENT 95MM (Rod) ×3 IMPLANT
SCREW CORT FIX FEN 5.5X7X40MM (Screw) ×6 IMPLANT
SCREW SET SINGLE INNER (Screw) ×24 IMPLANT
SCREW VIPER 7X45MM (Screw) ×6 IMPLANT
SCREW VIPER 7X50MM (Screw) ×12 IMPLANT
SPACER CONCORDE PRO 9X12X23 (Spacer) ×6 IMPLANT
SPONGE LAP 4X18 RFD (DISPOSABLE) ×9 IMPLANT
SPONGE SURGIFOAM ABS GEL SZ50 (HEMOSTASIS) ×3 IMPLANT
STAPLER VISISTAT 35W (STAPLE) ×3 IMPLANT
STYLET INTUB SATIN SLIP 14FR (MISCELLANEOUS) ×3 IMPLANT
SUT VIC AB 0 CT1 27 (SUTURE) ×2
SUT VIC AB 0 CT1 27XBRD ANBCTR (SUTURE) ×1 IMPLANT
SUT VIC AB 1 CTX 27 (SUTURE) ×3 IMPLANT
SUT VIC AB 1 CTX 36 (SUTURE) ×4
SUT VIC AB 1 CTX36XBRD ANBCTR (SUTURE) ×2 IMPLANT
SUT VIC AB 2-0 CT1 27 (SUTURE) ×2
SUT VIC AB 2-0 CT1 TAPERPNT 27 (SUTURE) ×1 IMPLANT
SUT VIC AB 3-0 X1 27 (SUTURE) ×3 IMPLANT
SYR 20CC LL (SYRINGE) ×3 IMPLANT
SYR CONTROL 10ML LL (SYRINGE) ×6 IMPLANT
TAP CANN VIPER2 DL 5.0 (TAP) ×3 IMPLANT
TAP CANN VIPER2 DL 6.0 (TAP) ×3 IMPLANT
TAP CANN VIPER2 DL 7.0 (TAP) ×3 IMPLANT
TOWEL GREEN STERILE (TOWEL DISPOSABLE) ×3 IMPLANT
TOWEL GREEN STERILE FF (TOWEL DISPOSABLE) ×3 IMPLANT
TRAY FOLEY MTR SLVR 16FR STAT (SET/KITS/TRAYS/PACK) ×3 IMPLANT
WATER STERILE IRR 1000ML POUR (IV SOLUTION) IMPLANT
YANKAUER SUCT BULB TIP NO VENT (SUCTIONS) ×3 IMPLANT

## 2018-01-16 NOTE — Transfer of Care (Signed)
Immediate Anesthesia Transfer of Care Note  Patient: Ulice BoldJoan M Mcglaughlin  Procedure(s) Performed: Revision of pedicle screws L5 with mPACT pedicle screws with cement, new right L4-5 transforaminal lumbar interbody fusion, revision of cage Left L4-5 (N/A Spine Lumbar)  Patient Location: PACU  Anesthesia Type:General  Level of Consciousness: oriented, drowsy and patient cooperative  Airway & Oxygen Therapy: Patient Spontanous Breathing and Patient connected to face mask oxygen  Post-op Assessment: Report given to RN and Post -op Vital signs reviewed and stable  Post vital signs: Reviewed  Last Vitals:  Vitals Value Taken Time  BP 147/92 01/16/2018  3:52 PM  Temp    Pulse 114 01/16/2018  4:00 PM  Resp 14 01/16/2018  4:00 PM  SpO2 100 % 01/16/2018  4:00 PM  Vitals shown include unvalidated device data.  Last Pain:  Vitals:   01/16/18 0548  TempSrc:   PainSc: 3       Patients Stated Pain Goal: 3 (01/16/18 0548)  Complications: No apparent anesthesia complications

## 2018-01-16 NOTE — Anesthesia Preprocedure Evaluation (Addendum)
Anesthesia Evaluation  Patient identified by MRN, date of birth, ID band Patient awake    Reviewed: Allergy & Precautions, H&P , NPO status , Patient's Chart, lab work & pertinent test results  History of Anesthesia Complications (+) PONV and history of anesthetic complications  Airway Mallampati: II   Neck ROM: full    Dental  (+) Chipped, Dental Advisory Given,    Pulmonary    breath sounds clear to auscultation       Cardiovascular negative cardio ROS   Rhythm:regular Rate:Normal     Neuro/Psych  Headaches, PSYCHIATRIC DISORDERS Anxiety Depression  Neuromuscular disease    GI/Hepatic negative GI ROS, Neg liver ROS,   Endo/Other  Morbid obesity  Renal/GU negative Renal ROS     Musculoskeletal  (+) Arthritis , Fibromyalgia -  Abdominal   Peds  Hematology   Anesthesia Other Findings   Reproductive/Obstetrics negative OB ROS                            Anesthesia Physical Anesthesia Plan  ASA: II  Anesthesia Plan: General   Post-op Pain Management:    Induction: Intravenous  PONV Risk Score and Plan: 4 or greater and Ondansetron, Dexamethasone, Midazolam, Scopolamine patch - Pre-op and Treatment may vary due to age or medical condition  Airway Management Planned: Oral ETT  Additional Equipment:   Intra-op Plan:   Post-operative Plan: Extubation in OR  Informed Consent: I have reviewed the patients History and Physical, chart, labs and discussed the procedure including the risks, benefits and alternatives for the proposed anesthesia with the patient or authorized representative who has indicated his/her understanding and acceptance.     Plan Discussed with: CRNA, Anesthesiologist and Surgeon  Anesthesia Plan Comments:         Anesthesia Quick Evaluation

## 2018-01-16 NOTE — Discharge Instructions (Addendum)

## 2018-01-16 NOTE — Interval H&P Note (Signed)
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 After consideration of risks, benefits and other options for treatment, the patient has consented to  Procedure(s): Revision of pedicle screws L5 with mPACT pedicle screws with cement, new right L4-5 transforaminal lumbar interbody fusion, revision of cage Left L4-5 (N/A) as a surgical intervention .  The patient's history has been reviewed, patient examined, no change in status, stable for surgery.  I have reviewed the patient's chart and labs.  Questions were answered to the patient's satisfaction.     Vira BrownsJames Nitka

## 2018-01-16 NOTE — Anesthesia Procedure Notes (Signed)
Procedure Name: Intubation Date/Time: 01/16/2018 7:38 AM Performed by: Lovie Cholock, Lorenso Quirino K, CRNA Pre-anesthesia Checklist: Patient identified, Emergency Drugs available, Suction available and Patient being monitored Patient Re-evaluated:Patient Re-evaluated prior to induction Oxygen Delivery Method: Circle System Utilized Preoxygenation: Pre-oxygenation with 100% oxygen Induction Type: IV induction Ventilation: Mask ventilation without difficulty Laryngoscope Size: Miller and 2 Grade View: Grade II Tube type: Oral Tube size: 7.5 mm Number of attempts: 1 Airway Equipment and Method: Stylet Placement Confirmation: ETT inserted through vocal cords under direct vision,  positive ETCO2 and breath sounds checked- equal and bilateral Secured at: 22 cm Tube secured with: Tape Dental Injury: Teeth and Oropharynx as per pre-operative assessment

## 2018-01-16 NOTE — Brief Op Note (Addendum)
01/16/2018  3:18 PM  PATIENT:  Laura Solis  67 y.o. 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 Bilateral TLIF L4-5 with 12mm x 23mm [place] ProTi Cages lordotic 5 degrees  SURGEON:  Surgeon(s) and Role:    *Kerrin ChampagneNitka, Aracelia Brinson E, MD - Primary  PHYSICIAN ASSISTANT:Marget Outten Barry Dieneswens, PA-C  ANESTHESIA:   local and general , Dr. Abran Richardose, Jennifer Rock, CRNA.  EBL:  600 mL   BLOOD ADMINISTERED:210 CC PRBC  DRAINS: (one medium) Hemovact drain(s) in the left lumbar  with  Suction Open and Urinary Catheter (Foley)   LOCAL MEDICATIONS USED:  MARCAINE 0.5% 1:1 EXPAREL 1.3% Amount: 40 ml  SPECIMEN:  No Specimen  DISPOSITION OF SPECIMEN:  N/A  COUNTS:  YES  TOURNIQUET:  * No tourniquets in log *  DICTATION: .Dragon Dictation  PLAN OF CARE: Admit to inpatient   PATIENT DISPOSITION:  PACU - hemodynamically stable.   Delay start of Pharmacological VTE agent (>24hrs) due to surgical blood loss or risk of bleeding: yes

## 2018-01-16 NOTE — Op Note (Signed)
01/16/2018  4:07 PM  PATIENT:  Laura Solis  67 y.o. 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   PROCEDURE:  Procedure(s): Revision of pedicle screws L5 with mPACT pedicle screws with cement, new right L4-5 transforaminal lumbar interbody fusion, revision of cage Left L4-5. Bilateral TLIFs L4-5 level with 53m x 266mConcorde ProTi cages, local bone graft and vivigen. Revision of rods and pedicle screws bilateral L3, bilateral L4 and bilateral L5, new corticotrajectory screws bilateral S1.    SURGEON:  JaJessy OtoMD     ASSISTANT:  JaBenjiman CorePA-C  (Present throughout the entire procedure and necessary for completion of procedure in a timely manner)     ANESTHESIA:  General,supplemented with local marcaine 0.5% 1:1 exparel 1.3% total of 40 CC.  Dr. RoKalman ShanJeAlleg[facility]CRNA.     EBL: 600CC  CELL SAVER RETURNED: 210CC  DRAINS: Foley to SD, Hemovac drain left lumbar x 1.   HARDWARE REMOVED:  6 pedicle screws and on 9 x 21 mm LIFT cage    COMPLICATIONS:  None.     COMPONENTS: Implant Name Type Inv. Item Serial No. Manufacturer Lot No. LRB No. Used  BONE VIVIGEN FORMABLE 10CC - [id number] Implant BONE VIVIGEN FORMABLE 10CC [id number] [place] [facility]  N/A 1  concorde proti 5 dg     DEPUY SYNTHES [id number]  SCREW VIPER 7X45MM - [id number] SCREW VIPER 7X45MM  [employer]  N/A 2  ROD EXPEDIUM PREBENT 95MM - [id number] ROD EXPEDIUM PREBENT 95MM  [employer] SPINE  N/A 1  SCREW SET SINGLE INNER - [id number] SCREW SET SINGLE INNER  [employer] SPINE  N/A 8  SCREW CORT FIX FEN 5.5X7X40MM - [id number] SCREW CORT FIX FEN 5.5X7X40MM  [employer] SPINE  N/A 2  SCREW VIPER 7X50MM - [id number] SCREW VIPER 7X50MM  [employer] SPINE  N/A 4  CONNECTOR Z ROD UNIVERSAL 5.5 - [id number] CONNECTOR Z ROD UNIVERSAL 5.5  [employer] SPINE  N/A 1  CONNECTOR SFX SIZE A4 - [id number] Implant CONNECTOR SFX SIZE A4  [employer] DE[employer] SPINE  N/A 1    PROCEDURE:The patient was met in the holding area, and the appropriate lumbar level  Right L4-5 level identified and marked with an x and my initials.The patient was then transported to OR. The patient was then placed under general anesthesia without difficulty.The patient received appropriate preoperative antibiotic prophylaxis 3 grams ancef. Nursing staff inserted a Foley catheter under sterile conditions. He was then turned to a prone position JaParispine table was used for this case. All pressure points were well padded PAS stocking applied bilateral lower extremity to prevent DVT.The arms at the side to 90 90 Standard prep DuraPrep solution. Draped in the usual manner.  Iodine Vi-Drape was used and the old incision scar was marked. Time-out procedure was called and correct. Skin in the midline between L2 and S1 was then infiltrated with local anesthesia exparel 1.3% total 20 cc used. Incision was then made extending from L2-S1 through the skin and subcutaneous layers down to the patient's lumbodorsal fascia and spinous processes.  ellipsing the old incision scar at L2 to S1The incision then carried sharply excising the supraspinous ligament and then continuing the lateral aspect of the spinous processes of L2,L3 and S1 . Cobb elevator used to carefully elevate the paralumbar muscles off of the posterior elements using electrocautery carefully drilled bleeding and perform dissection of the muscle tissues of the preserving the facet capsule at the L2-3. Continuing the exposure out laterally to expose the lateral  margin of the facet joint line and retained pedicles screws and rods bilateral L3-4-5, . The caudal levels L3 through S2 Exposure carried out to the hardware bilaterally exposing the pedicle screws and rods L3, L4 and L5. The caps at each level were removed and the rods removed bilaterally.  Incision was carried in the midline down to the L4 and L5 level to a safe area in the midline superficial to the thecal area bleeders controlled using electrocautery monopolar electrocautery.  The bilateral pedicle screw and rod construct at the L3 to S1 level was also exposed. The hardware used to identify the lumbar levels. Vicryl retractors were placed Loupe magnification and headlight were used for this  procedure.   A self retaining retractor then placed over the posterior aspect of the lamina at the expected L2-L5 level.The appropriate level L4-5 identified at the level of the L4 and L5 pedicle screws.  2 mm Kerrison then used to enter the spinal canal over the superior aspect of the L5 lamina carefully using the Kerrison to debris the attachment as a curet. Foraminotomy was then performed over the L5 nerve roots. The medial 50% superior articular process of L5 bilaterally and then resected using an osteotome and 3 mm Kerrison. This allowed for identification of the thecal sac and bilateral L4 nerve roots. Penfield 4 was then used to carefully mobilize the thecal sac and separate the scar tissue off the thecal and the L4 nerve roots were identified within the lateral recess entering the  L4 neuroforamen. The residual right lamina of L4 was resected and the residual pars area resected bilaterally using osteotomes and kerrisons.   Carefully the lateral aspect of the L5 nerve root was identified and a Penfield 4 was used to mobilize the nerve medially such that the right  L4-5 disc was visible with microscope.No disc protrusion noted but disc bulge with severe hypertrophic ligament thicking, the slipped vertebra at  this. Further foraminotomies was performed over the left L4 nerve root the nerve root until it was noted to be decompressed. The nerve root able to be retracted along the medial aspect of the L4 pedicle and foramenotomy completed.  Had a moderate amount of further resection of the L4 lamina inferiorly was performed. With this then the disc space at L4-5 was easily visualized on the right side and eventually on the left side.ck nerve probe could then be passed out the L4 neuroforamen and the L5 neuroforamen. Venous bleeding encountered. Thrombin-soaked Gelfoam used to control this following this then the sac and the L3 nerve root were mobilized medially and the L2-3 disc examined and found not to be herniated. Irrigation was carried out down to this bleeding controlled with Gelfoam. Gelfoam was then removed. Irrigation carried careful examination demonstrated no active bleeding present.  Following irrigation the left L4-5 level was exposed with viper retractors. Medial border of previous laminotomy at the L4-5 level was then identified using curettes and blunt dissection. Osteotomes then used to resect a portion of the medial superior articular process of L5. Additionally the  superior border of the L5 pedicle exposed both medially and superiorly. This allowed for exposure of the left L4 neuroforamen area carefully the bone overlying the foramen was drilled to within layer and then resected using 3 mm millimeters Kerrisons and osteotomes. The L5 nerve root was then able to be freed up with a Penfield 4 carefully medially and exposing the left L4-5 disc posterior laterally. Spur was found to be present as well as bone impressing on the L4 nerve root. Lateral aspect of the thecal sac was then able to be mobilized medially to the medial border of the L5 pedicle and the left LIFT cage identified along with some bone graft retropulsed into the left side of the spinal canal by about 1 or 2 mm. Using Epstein curettes then  on graft and the disc entered and removed off of the superior aspect of the L5 vertebral body lip. The disc space was debrided using pituitary rongeurs of loosened debris and old bone graft.The right side of the disc space was dialated up to10 mm. And with the dialator in place on the right side of the disc space at L4-5 the left LIFT cage was able to be first lowered in height using the flat head screw driver and inserting the screw driver and turning to the left the cage height was able to be shortened allowing for mobilization  Of the cage. The care initially reimpacted into the disc space with the placement of the dialators on the right  The cage identified and a Penfield 4 used to free up the posterior aspect of the cage from the thecal sac. A cage was then able to be removed from the posterior aspect of the disc space at the left L4-5 level. This completed the decompression in the patient's left L5 neuroforamen and removal of the left L4-5 LIFT cage. The loosened bilateral L5 pedicle screws were then removed and revised with a 106m x 462mviper corticotrajectory pedicle screws, original was 40m70m 40 mm  pedicle screw. Bilateral L4-5 complete facetectomies were perform at L4-5 to provide for exposure of the bilateral L4-5 neuroforamen for ease of placement of bilateral L4-5 TLIFs (transforaminal lumbar interbody fusion) at L4-5 bilateral inferior portions of the lamina and pars were also resected were the inferior articular process L4 bilaterally. The L5 nerve root identified along the right side medial aspect of the L5 pedicle. Superior articular process of L5 was then resected bilaterally further decompressing the L4 nerve roots providing for exposure of the area just superior to the L5 pedicles for a placement of L4-5 cages.C-Arm fluoroscopy was draped sterilely and brought into the field.  Attention then turned to placement of the transforaminal lumbar interbody fusion cages. Using a Penfield 4 the  right lateral aspect of the thecal sac at the L4-5 disc space was carefully freed up The thecal sac could then easily be retracted in the posterior lateral aspect of the right L4-5 disc was exposed 15 blade scalpel used to incise the posterolateral disc and an osteotome used to resect a small portion of bone off the superior aspect of the posterior superior vertebral body of L5 in order to ease the entry into the L4-5 disc space. A 3mm58mrrison rongeur was then able to be introduced in the disc space debrided. 7 mm dilator was used to dialate the L4-5 disc space on the right side attempts were made to dilate further the 8 mm , 9 mm, 10mm35mmm 63m 12mm w65msuccessful and using  small curettes and the disc space was debrided a moderate degenerative disc present in the endplates debrided to bleeding endplate bone.The 12 mm trial noted to fill the L4-5 disc place.  A 12 mm x 62m lordotic Concord ProTi cage was carefully packed with morcellized bone graft and the been harvested from previous laminotomies and vivigen.The cage was then inserted with the insertion handle observed to be in good depth, the care was inserted in less convergence that usual to allow for a left cage to be placed .  Additional bone graft was packed into the intervertebral disc space prior to placement of the concord cage. Bleeding controlled using bipolar electrocautery thrombin soaked gel cottonoids. Using a Penfield 4 the left lateral aspect of the thecal sac at the L4-5 disc space was carefully freed up The thecal sac could then easily be retracted in the posterior lateral aspect of the L4-5 disc and an osteotome used to resect a small portion of bone off the superior aspect of the posterior superior vertebral body of L5 in order to ease the entry into the L4-5 disc space. A 346mkerrison rongeur was then able to be introduced in the disc space debrided it was quite narrow. The left L4-5 disc space was made to dilate further with 8 mm ,  9 mm, 1075m66m33md 12mm15mlators successfully and using small curettes and the disc space was debrided of some small amount of degenerative disc present in the endplates debrided to bleeding endplate bone. A 12 mm x 23mm 42motic concord ProTi cage was carefully packed with morcellized bone graft and the been harvested from previous laminotomies incombination with vivigen.The cage was then inserted with the insertion handle. Additional bone graft was packed into the intervertebral disc space several times prior to placement of the concord cage. Bleeding controlled using bipolar electrocautery thrombin soaked gel cottonoids.   Using C-arm fluoroscopy then a hole made into the posterior and medial aspect of the left pedicle of L5 observed in the pedicle using ball tipped nerve hook and hockey stick nerve probe initial entry was determined on fluoroscopy to be good position alignment so that a 4.35 mm tap was then used to tap the left L5 pedicle to a depth of nearly 45 mm observed on C-arm fluoroscopy to be beyond the posterior one third of the lumbar vertebra and good position alignment within the left L5 pedicle this was then removed and the pedicle channel probed demonstrating patency no sign of rupture the cortex of the pedicle. Tapping with a 5 mm screw tap, then 6 mm tap and then a 7mm ta58mthen 7.0mm x 469mm screw was placed on the left side at the L5 level. The pedicle channel of L5 on the left probed demonstrating patency no sign of rupture the cortex of the pedicle. Viper screw for fixation of this level was measured as 7.0 mm x 45 mm screw was placed on the left side at the L5 level. Using C-arm fluoroscopy then a hole made into the posterior and medial aspect of the right pedicle of L5 observed in the pedicle using ball tipped nerve hook and hockey stick nerve probe initial entry was determined on fluoroscopy to be good position alignment so that a 4.35 mm tap was then used to tap the right L5  pedicle to a depth of nearly 45 mm observed on C-arm fluoroscopy to be beyond the posterior one third of the lumbar vertebra and good position alignment within the right L5 pedicle  this was then removed and the pedicle channel probed demonstrating patency no sign of rupture the cortex of the pedicle. Tapping with a 5 mm screw tap and then 6.o mm tap and then a 7.0 mm tap, then 7.56m x 45 mm screw was placed on the right side at the L5 level. The pedicle channel of L5 on the right probed demonstrating patency no sign of rupture the cortex of the pedicle. Viper screw for fixation of this level was measured as 7.0 mm x 45 mm screw. Using C-arm fluoroscopy then the hole into the pedicle of left S1 was placed and observed with ball-tipped probe the S1 pedicle on this side was 7.0 mm x 40 mm. A 6.051mX 40 mm carefully passed down the center of the S1 pedicle to a depth of nearly 40 mm. Observed on C-arm fluoroscopy to be in good position alignment channel was probed with a ball-tipped probe ensure patency no sign of cortical disruption. Following tapping with a 5 mm tap then a 6.0 mm tap, a 7.0 x 40 mm screw was placed on the left side pedicle at S1. C-arm fluoroscopy was used to localize the hole made in the medial aspect of the pedicle of S1 on the right localizing the pedicle within the spinal canal with nerve hook and hockey-stick nerve probe carefully passed down the center of the S1 pedicle to a depth of nearly 40 mm. Observed on C-arm fluoroscopy to be in good position alignment channel was probed with a ball-tipped probe ensure predicle screw  to be placed on the right side at the S1 level.C-arm fluoroscopy was used to localize the hole made in the lateral aspect of the pedicle of S1 on the right localizing the pedicle within the spinal canal with nerve hook and hockey-stick nerve probe re patency no sign of cortical disruption. Following tapping with a 4 mm, 79m73mnd 6 mm taps a 7.0 x 40 mm screw was placed on  right side at the S1 level  Observed to be in good position and alignment. Due to the type of mPACT screws placed at L5 and S1 the pedicle screws at L3 and L4 were revised to 7.0 mm x 50 mm viper corticocancellous pedicle screws. This was done by remove each screw tapping with the 7.0 mm tap and then inserting the new pedicle screw in the same degree of convergence. Each of the removed previous screws measured 6.0 mm x 45 mm some that  Adequate fixation was obtained with the new screws. The area of the pars bilaterally at L4-5 had been mobilized with osteotomy while decompressing the L4 nerve roots bilaterally.  These were packed with cancellous local bone graft. The right side first, a 5 mm titanium rod with an off set configuration to allow for the previous laterally placed screws at L3 and L4 on the right side, these were templated, cut for length and were then carefully further contoured using the french benders to fit the fasteners at theS1 to L3 levels on the right. The left pedicle screws aligned  Well and an 85 mm precontoured rod was able to be placed into the screw fasteners on the left side without further contouring.  The screw fasteners at L3 tightened with a torque driver to 80 foot pounds. Compression between the fasteners of L3 and the L4 fastener the L4 fastener was tightened to 80 foot lbs. Across the left side L3 and L4 screw fasteners compression was obtained on the right side between  L4 and L5, then L5 and S1 by compressing between the fasteners and tightening the screw caps 85 pounds. Similarly this was done on the left side at L3-4, L4-5 and L5-S1 obtaining compression and tightened 85 pounds.Irrigation was carried out with copious amounts of saline solution this was done throughout the case. Cell Saver was used during the case. 210 cc of cell saver blood was returned. A portion of the L5 spinous process superiorly was able to be resected and the rods measured for a crosslink, this  measured an A4. The cross link then attached to the rods and the torque drive used to torque the screws to the rod fasteners to 85 foot lbs and then the central length screw locking mechanism torqued to 85 foot lbs. The [facility] neuroprobe was used to probe the neuroforamen bilateral L4 and L5. And also used to probe the left L4 and L5 neuroforamem, these were determined to be well decompressed. Permanent C-arm images were obtained in AP and lateral plane and oblique planes. Remaining local bone graft was then applied along both lateral posterior lateral region extending from L3 to S1 facet beds.Gelfoam was then removed spinal canal. The lumbodorsal musculature carefully exam debrided of any devitalized tissue following removal of Viper retractors were the bleeders were controlled using electrocautery. 1 gram of vancomycin powder appled throughout the deep muscle and posterior elements.A medium hemovac drain placed on the left side exiting the left lower lumbar spine. The area dorsal lumbar muscle were then approximated in the midline with interrupted #1 Vicryl sutures loose the dorsal fascia was reattached to the spinous process of L1 superiorly and L5 inferiorly this was done with #1 Vicryl sutures.The paralumbar muscles were then approximated with interrupted #1 vicryl. The remaining lumbodorsal fascia approximated with #1 vicryl. Subcutaneous layers then approximated using interrupted 0 Vicryl sutures and 2-0 Vicryl sutures. Skin was closed with a running subcutaneous stitch of 4-0 Vicryl Dermabond was applied then MedPlex bandage. All instrument and sponge counts were correct. The patient was then returned to a supine position on her bed reactivated extubated and returned to the recovery room in satisfactory condition. Adequate hemostasis then with Gelfoam thrombin soaked cottonoids these were then removed when hemostasis had been obtained. Copious amount of irrigation was then carried out and devitalized  tissue debrided. Cell Saver was used during this case however there was 600 cc blood loss and a total of 210 cc of Cell Saver blood was returned. A medium Hemovac drain was placed in the left lower lumbar spine and the midline incision away from neural structures the deep paralumbar muscles with her approximated with 1 Vicryl sutures, the lumbodorsal fascia approximated with 0 and 1 Vicryl sutures. Subcutaneous layers were then approximated with interrupted 0 and 2-0 Vicryl sutures , skin closed with a running subcuticular 4-0 Vicryl suture. The bone was then applied. Mepilex bandage was then applied. The exiting Hemovac drain site and carefully bandage. All instrument and sponge counts were correct. Patient was then returned to the supine position reactivated extubated and returned to the recovery room in satisfactory condition Benjiman Core, PA-C perform the duties of assistant surgeon during this case. He was present from the beginning of the case to the end of the case assisting in transfer the patient from his stretcher to the OR table and back to the stretcher at the end of the case. Assisted in careful retraction and suction of the laminectomy site delicate neural structures operating under the operating room microscope. He performed closure  of the incision from the fascia to the skin applying the dressing.     Basil Dess  01/16/2018, 4:07 PM

## 2018-01-17 ENCOUNTER — Inpatient Hospital Stay (HOSPITAL_COMMUNITY): Payer: Medicare Other

## 2018-01-17 ENCOUNTER — Other Ambulatory Visit: Payer: Self-pay

## 2018-01-17 DIAGNOSIS — D62 Acute posthemorrhagic anemia: Secondary | ICD-10-CM | POA: Diagnosis not present

## 2018-01-17 LAB — BASIC METABOLIC PANEL
ANION GAP: 7 (ref 5–15)
BUN: 16 mg/dL (ref 6–20)
CO2: 27 mmol/L (ref 22–32)
Calcium: 7.9 mg/dL — ABNORMAL LOW (ref 8.9–10.3)
Chloride: 108 mmol/L (ref 101–111)
Creatinine, Ser: 0.94 mg/dL (ref 0.44–1.00)
Glucose, Bld: 145 mg/dL — ABNORMAL HIGH (ref 65–99)
POTASSIUM: 4.6 mmol/L (ref 3.5–5.1)
SODIUM: 142 mmol/L (ref 135–145)

## 2018-01-17 LAB — CBC
HCT: 31.5 % — ABNORMAL LOW (ref 36.0–46.0)
HEMOGLOBIN: 9.8 g/dL — AB (ref 12.0–15.0)
MCH: 31.5 pg (ref 26.0–34.0)
MCHC: 31.1 g/dL (ref 30.0–36.0)
MCV: 101.3 fL — ABNORMAL HIGH (ref 78.0–100.0)
PLATELETS: 195 10*3/uL (ref 150–400)
RBC: 3.11 MIL/uL — AB (ref 3.87–5.11)
RDW: 13.8 % (ref 11.5–15.5)
WBC: 11.6 10*3/uL — AB (ref 4.0–10.5)

## 2018-01-17 LAB — CBC WITH DIFFERENTIAL/PLATELET
ABS IMMATURE GRANULOCYTES: 0.1 10*3/uL (ref 0.0–0.1)
BASOS ABS: 0 10*3/uL (ref 0.0–0.1)
Basophils Relative: 0 %
EOS PCT: 0 %
Eosinophils Absolute: 0 10*3/uL (ref 0.0–0.7)
HCT: 28.3 % — ABNORMAL LOW (ref 36.0–46.0)
HEMOGLOBIN: 9.1 g/dL — AB (ref 12.0–15.0)
Immature Granulocytes: 1 %
LYMPHS PCT: 19 %
Lymphs Abs: 2.1 10*3/uL (ref 0.7–4.0)
MCH: 32 pg (ref 26.0–34.0)
MCHC: 32.2 g/dL (ref 30.0–36.0)
MCV: 99.6 fL (ref 78.0–100.0)
Monocytes Absolute: 0.9 10*3/uL (ref 0.1–1.0)
Monocytes Relative: 8 %
NEUTROS ABS: 7.8 10*3/uL — AB (ref 1.7–7.7)
NEUTROS PCT: 72 %
Platelets: 189 10*3/uL (ref 150–400)
RBC: 2.84 MIL/uL — ABNORMAL LOW (ref 3.87–5.11)
RDW: 13.7 % (ref 11.5–15.5)
WBC: 10.9 10*3/uL — AB (ref 4.0–10.5)

## 2018-01-17 MED ORDER — SODIUM CHLORIDE 0.9 % IV BOLUS
400.0000 mL | Freq: Once | INTRAVENOUS | Status: AC
  Administered 2018-01-17: 400 mL via INTRAVENOUS

## 2018-01-17 MED FILL — Thrombin (Recombinant) For Soln 20000 Unit: CUTANEOUS | Qty: 1 | Status: AC

## 2018-01-17 MED FILL — Sodium Chloride IV Soln 0.9%: INTRAVENOUS | Qty: 1000 | Status: AC

## 2018-01-17 MED FILL — Heparin Sodium (Porcine) Inj 1000 Unit/ML: INTRAMUSCULAR | Qty: 30 | Status: AC

## 2018-01-17 MED FILL — Sodium Chloride Irrigation Soln 0.9%: Qty: 3000 | Status: AC

## 2018-01-17 NOTE — Anesthesia Postprocedure Evaluation (Signed)
Anesthesia Post Note  Patient: Laura Solis  Procedure(s) Performed: Revision of pedicle screws L5 with mPACT pedicle screws with cement, new right L4-5 transforaminal lumbar interbody fusion, revision of cage Left L4-5 (N/A Spine Lumbar)     Patient location during evaluation: PACU Anesthesia Type: General Level of consciousness: awake and alert Pain management: pain level controlled Vital Signs Assessment: post-procedure vital signs reviewed and stable Respiratory status: spontaneous breathing, nonlabored ventilation, respiratory function stable and patient connected to nasal cannula oxygen Cardiovascular status: blood pressure returned to baseline and stable Postop Assessment: no apparent nausea or vomiting Anesthetic complications: no    Last Vitals:  Vitals:   01/17/18 0949 01/17/18 0951  BP: (!) 100/48 (!) 94/57  Pulse: 90 90  Resp:    Temp:    SpO2:      Last Pain:  Vitals:   01/17/18 0830  TempSrc:   PainSc: Asleep                 Konnie Noffsinger S

## 2018-01-17 NOTE — Evaluation (Signed)
Physical Therapy Evaluation Patient Details Name: Laura Solis MRN: [id number] DOB: [birth year] Today's Date: 01/17/2018   History of Present Illness  Revision of pedicle screws L5 with mPACT pedicle screws with cement, new right L4-5 transforaminal lumbar interbody fusion, revision of cage Left L4-5 on 01/16/18. Patient with a PMH significant for anxiety, fibromyalgia, OA.  Clinical Impression  Patient admitted with the above listed diagnosis. Prior to admission patient mod I with all mobility. Today patient requiring Min A/min guard for transfers and mobility - limited due to low BP, but stable throughout session with no onset of lightheadedness. Education on back precautions with good verbal understanding.  PT to follow acutely to maximize functional mobility prior to d/c.    Follow Up Recommendations Home health PT;Supervision - Intermittent    Equipment Recommendations  None recommended by PT    Recommendations for Other Services       Precautions / Restrictions Precautions Precautions: Back Required Braces or Orthoses: Spinal Brace Spinal Brace: Lumbar corset;Applied in sitting position Restrictions Weight Bearing Restrictions: No      Mobility  Bed Mobility Overal bed mobility: Needs Assistance Bed Mobility: Rolling;Sidelying to Sit;Sit to Sidelying Rolling: Min guard Sidelying to sit: Min guard     Sit to sidelying: Min assist    Transfers Overall transfer level: Needs assistance Equipment used: Rolling walker (2 wheeled) Transfers: Sit to/from [employer] Sit to Stand: Min guard Stand pivot transfers: Min guard       General transfer comment: VC for safety  Ambulation/Gait Ambulation/Gait assistance: Min guard Gait Distance (Feet): 20 Feet Assistive device: Rolling walker (2 wheeled) Gait Pattern/deviations: Step-through pattern;Decreased stride length Gait velocity: decreased   General Gait Details: very hesitant with gait  Stairs             Wheelchair Mobility    Modified Rankin (Stroke Patients Only)       Balance Overall balance assessment: Mild deficits observed, not formally tested                                           Pertinent Vitals/Pain Pain Assessment: No/denies pain Pain Score: 3  Pain Descriptors / Indicators: Aching;Grimacing;Discomfort Pain Intervention(s): Limited activity within patient's tolerance;Monitored during session;Repositioned    Home Living Family/patient expects to be discharged to:: Private residence Living Arrangements: Alone Available Help at Discharge: Available PRN/intermittently Type of Home: Mobile home Home Access: Stairs to enter Entrance Stairs-Rails: Right;Left;Can reach both Entrance Stairs-Number of Steps: 7 Home Layout: One level Home Equipment: Shower seat;Bedside commode;Walker - 2 wheels;Cane - single point      Prior Function Level of Independence: Independent         Comments: using RW for mobility and furniture walking     Hand Dominance        Extremity/Trunk Assessment   Upper Extremity Assessment Upper Extremity Assessment: Defer to OT evaluation    Lower Extremity Assessment Lower Extremity Assessment: Generalized weakness       Communication   Communication: No difficulties  Cognition Arousal/Alertness: Awake/alert Behavior During Therapy: WFL for tasks assessed/performed Overall Cognitive Status: Within Functional Limits for tasks assessed                                        General Comments  Exercises     Assessment/Plan    PT Assessment Patient needs continued PT services  PT Problem List Decreased strength;Decreased activity tolerance;Decreased balance;Decreased mobility;Decreased knowledge of use of DME;Decreased safety awareness;Decreased knowledge of precautions       PT Treatment Interventions      PT Goals (Current goals can be found in the Care Plan  section)  Acute Rehab PT Goals Patient Stated Goal: home in next few days PT Goal Formulation: With patient Time For Goal Achievement: 01/24/18 Potential to Achieve Goals: Good    Frequency Min 5X/week   Barriers to discharge        Co-evaluation               AM-PAC PT "6 Clicks" Daily Activity  Outcome Measure Difficulty turning over in bed (including adjusting bedclothes, sheets and blankets)?: Unable Difficulty moving from lying on back to sitting on the side of the bed? : Unable Difficulty sitting down on and standing up from a chair with arms (e.g., wheelchair, bedside commode, etc,.)?: Unable Help needed moving to and from a bed to chair (including a wheelchair)?: A Little Help needed walking in hospital room?: A Little Help needed climbing 3-5 steps with a railing? : A Lot 6 Click Score: 11    End of Session Equipment Utilized During Treatment: Gait belt;Back brace Activity Tolerance: Patient tolerated treatment well Patient left: in bed;with call bell/phone within reach;with family/visitor present Nurse Communication: Mobility status PT Visit Diagnosis: Unsteadiness on feet (R26.81);Other abnormalities of gait and mobility (R26.89);Muscle weakness (generalized) (M62.81)    Time: 1353-1430 PT Time Calculation (min) (ACUTE ONLY): 37 min   Charges:   PT Evaluation $PT Eval Moderate Complexity: 1 Mod PT Treatments $Therapeutic Activity: 8-22 mins   PT G Codes:        Kipp LaurenceStephanie R Takeo Harts, PT, DPT 01/17/18 3:49 PM

## 2018-01-17 NOTE — Progress Notes (Signed)
     Subjective: 1 Day Post-Op Procedure(s) (LRB): Revision of pedicle screws L5 with mPACT pedicle screws with cement, new right L4-5 transforaminal lumbar interbody fusion, revision of cage Left L4-5 (N/A) Awake, alert and oriented x 4. Patient in an adjacent area is screaming incessantly. She stood and walked to bathroom and has voided since foley discontinued. No abdomenal discomfort. Hgb 9.6 now at 9.1 recheck in the AM. Hemovac intact, dressing changed.   Patient reports pain as moderate.    Objective:   VITALS:  Temp:  [97.5 F (36.4 C)-98.4 F (36.9 C)] 97.8 F (36.6 C) (06/19 1233) Pulse Rate:  [86-115] 86 (06/19 1233) Resp:  [8-21] 20 (06/19 1233) BP: (85-163)/(48-80) 96/50 (06/19 1233) SpO2:  [91 %-100 %] 100 % (06/19 1233)  Neurologically intact ABD soft Neurovascular intact Sensation intact distally Intact pulses distally Dorsiflexion/Plantar flexion intact Incision: dressing C/D/I and Hemovac with drainage that is mild, plan to discontinue in the AM.    LABS Recent Labs    01/17/18 0702 01/17/18 1511  HGB 9.8* 9.1*  WBC 11.6* 10.9*  PLT 195 189   Recent Labs    01/17/18 0702  NA 142  K 4.6  CL 108  CO2 27  BUN 16  CREATININE 0.94  GLUCOSE 145*   No results for input(s): LABPT, INR in the last 72 hours. CXR negative for cardiomegally or infiltrate, NAD.  EKG normal sinus, normal   Assessment/Plan: 1 Day Post-Op Procedure(s) (LRB): Revision of pedicle screws L5 with mPACT pedicle screws with cement, new right L4-5 transforaminal lumbar interbody fusion, revision of cage Left L4-5 (N/A) Anemia of acute blood loss. Low O2 sat resolved.   Advance diet Up with therapy  Continue with IVF but decrease to 125cc/hr.  AM CBC and BMET  Laura Solis 01/17/2018, 4:06 PMPatient ID: Laura Solis, female   DOB: 07/03/1951, 67 y.o.   MRN: [id number]

## 2018-01-17 NOTE — Progress Notes (Signed)
     Subjective: 1 Day Post-Op Procedure(s) (LRB): Revision of pedicle screws L5 with mPACT pedicle screws with cement, new right L4-5 transforaminal lumbar interbody fusion, revision of cage Left L4-5 (N/A) Awake, alert and oriented x 4. o2  Per nasal cannula. Foley discontinued. Hemovac in place.  Patient reports pain as moderate.    Objective:   VITALS:  Temp:  [97.5 F (36.4 C)-98.4 F (36.9 C)] 97.8 F (36.6 C) (06/19 0758) Pulse Rate:  [87-116] 87 (06/19 0758) Resp:  [8-21] 16 (06/19 0758) BP: (85-163)/(51-92) 106/55 (06/19 0758) SpO2:  [91 %-100 %] 100 % (06/19 0758)  Neurologically intact ABD soft Neurovascular intact Sensation intact distally Intact pulses distally Dorsiflexion/Plantar flexion intact Incision: dressing C/D/I and hemovac to SD.    LABS Recent Labs    01/17/18 0702  HGB 9.8*  WBC 11.6*  PLT 195   Recent Labs    01/17/18 0702  NA 142  K 4.6  CL 108  CO2 27  BUN 16  CREATININE 0.94  GLUCOSE 145*   No results for input(s): LABPT, INR in the last 72 hours.   Assessment/Plan: 1 Day Post-Op Procedure(s) (LRB): Revision of pedicle screws L5 with mPACT pedicle screws with cement, new right L4-5 transforaminal lumbar interbody fusion, revision of cage Left L4-5 (N/A)  Advance diet Up with therapy  Check CXR due to low sats. Increase IVF to 125 cc/hr/  Vira BrownsJames Nitka 01/17/2018, 8:56 AMPatient ID: Laura Solis, female   DOB: 08/18/1950, 67 y.o.   MRN: [id number]

## 2018-01-17 NOTE — Progress Notes (Signed)
Patient desat to 70 on trial run off of oxygen. Placed on 2 liters and encouraged the incentive spirometer to ensure adequate oxygenation. After a few minutes of using the IS and oxygent therapy she returned to 98% on 2 liters. Rechecked her blood pressure after receiving bolus. It improved to 97/58. Due to recent decreased oxygenation, low blood pressure, and patient appearing drowsy, I didn't attempt to walk this am as ordered for patient safety. Will inform oncoming shift to ambulate patient once she's more alert. Will continue to monitor for patient safety.

## 2018-01-17 NOTE — Evaluation (Signed)
Occupational Therapy Evaluation Patient Details Name: Laura Solis MRN: [id number] DOB: Jan 19, 1951 Today's Date: 01/17/2018    History of Present Illness Revision of pedicle screws L5 with mPACT pedicle screws with cement, new right L4-5 transforaminal lumbar interbody fusion, revision of cage Left L4-5   Clinical Impression   Patient is s/p back surgery resulting in the deficits listed below (see OT Problem List).  Patient will benefit from skilled OT to increase their safety and independence with ADL and functional mobility for ADL (while adhering to their precautions) to facilitate discharge to venue listed below.      Follow Up Recommendations  No OT follow up;Supervision - Intermittent    Equipment Recommendations  None recommended by OT - Pt will benefit from 1 more OT visit to reiterate back  Back precautions with ADL activity . Pt agrees     Precautions / Restrictions Precautions Precautions: Back Required Braces or Orthoses: Spinal Brace Spinal Brace: Applied in sitting position      Mobility Bed Mobility Overal bed mobility: Needs Assistance Bed Mobility: Rolling;Sidelying to Sit Rolling: Min assist Sidelying to sit: Min assist          Transfers Overall transfer level: Needs assistance Equipment used: 1 person hand held assist Transfers: Sit to/from [employer] Sit to Stand: Min assist Stand pivot transfers: Min assist       General transfer comment: VC for hand placement        ADL either performed or assessed with clinical judgement   ADL Overall ADL's : Needs assistance/impaired Eating/Feeding: Set up;Sitting   Grooming: Set up;Sitting   Upper Body Bathing: Set up;Sitting   Lower Body Bathing: Moderate assistance;Sit to/from stand;Cueing for safety;Cueing for compensatory techniques;Cueing for sequencing;Cueing for back precautions   Upper Body Dressing : Set up;Sitting   Lower Body Dressing: Moderate assistance;Cueing for  safety;With adaptive equipment;With caregiver independent assisting;Cueing for back precautions   Toilet Transfer: Minimal assistance;Stand-pivot;Cueing for safety;BSC   Toileting- Clothing Manipulation and Hygiene: Maximal assistance;Sit to/from stand;Adhering to back precautions         General ADL Comments: Pt has AE at home. Daugther will A as needed.  Pts able to get from bed to [facility] to chair with OT     Vision Patient Visual Report: No change from baseline       Perception     Praxis      Pertinent Vitals/Pain Pain Assessment: 0-10 Pain Score: 4  Pain Descriptors / Indicators: Sore Pain Intervention(s): Monitored during session     Hand Dominance     Extremity/Trunk Assessment Upper Extremity Assessment Upper Extremity Assessment: Generalized weakness           Communication Communication Communication: No difficulties   Cognition Arousal/Alertness: Awake/alert Behavior During Therapy: WFL for tasks assessed/performed Overall Cognitive Status: Within Functional Limits for tasks assessed                                                Home Living Family/patient expects to be discharged to:: Private residence Living Arrangements: Alone Available Help at Discharge: Available PRN/intermittently Type of Home: Mobile home Home Access: Stairs to enter           Bathroom Shower/Tub: Chief Strategy Officer: Standard     Home Equipment: Shower seat;Bedside commode;Walker - 2 wheels;Cane - single point  Prior Functioning/Environment Level of Independence: Independent        Comments: using RW for mobility and furniture walking        OT Problem List: Impaired balance (sitting and/or standing);Decreased knowledge of use of DME or AE;Decreased knowledge of precautions;Decreased strength;Obesity;Pain      OT Treatment/Interventions: Self-care/ADL training;Patient/family education;DME and/or AE  instruction;Therapeutic activities    OT Goals(Current goals can be found in the care plan section) Acute Rehab OT Goals Patient Stated Goal: home in next few days OT Goal Formulation: With patient Time For Goal Achievement: 01/24/18 Potential to Achieve Goals: Good  OT Frequency: Min 2X/week    AM-PAC PT "6 Clicks" Daily Activity     Outcome Measure Help from another person eating meals?: None Help from another person taking care of personal grooming?: None Help from another person toileting, which includes using toliet, bedpan, or urinal?: A Lot Help from another person bathing (including washing, rinsing, drying)?: A Little Help from another person to put on and taking off regular upper body clothing?: A Little Help from another person to put on and taking off regular lower body clothing?: A Lot 6 Click Score: 18   End of Session Equipment Utilized During Treatment: Back brace Nurse Communication: Mobility status  Activity Tolerance: Patient tolerated treatment well Patient left: in chair;with call bell/phone within reach;with family/visitor present  OT Visit Diagnosis: Muscle weakness (generalized) (M62.81);Pain;Unsteadiness on feet (R26.81)                Time: [id number] OT Time Calculation (min): 27 min Charges:  OT General Charges $OT Visit: 1 Visit OT Evaluation $OT Eval Moderate Complexity: 1 Mod G-Codes:     Lise AuerLori Eran Mistry, [place]OT [phone]  Einar CrowEDDING, Kylyn Sookram D 01/17/2018, 1:57 PM

## 2018-01-17 NOTE — Progress Notes (Addendum)
Notified on-call physician MD. Ophelia CharterYates of the patient's low blood pressure 85/51.  She currently has a foley. She voided 350 ml via her catheter. Patient remained asymptomatic. Will continue to monitor for patient safety.  Will recheck the patient's blood pressure after the bolus.

## 2018-01-18 LAB — BASIC METABOLIC PANEL
ANION GAP: 5 (ref 5–15)
BUN: 13 mg/dL (ref 6–20)
CHLORIDE: 109 mmol/L (ref 101–111)
CO2: 29 mmol/L (ref 22–32)
Calcium: 8.1 mg/dL — ABNORMAL LOW (ref 8.9–10.3)
Creatinine, Ser: 0.74 mg/dL (ref 0.44–1.00)
GFR calc Af Amer: >60 mL/min (ref >60)
GFR calc non Af Amer: >60 mL/min (ref >60)
Glucose, Bld: 106 mg/dL — ABNORMAL HIGH (ref 65–99)
POTASSIUM: 4.3 mmol/L (ref 3.5–5.1)
Sodium: 143 mmol/L (ref 135–145)

## 2018-01-18 LAB — CBC
HCT: 28.9 % — ABNORMAL LOW (ref 36.0–46.0)
HEMOGLOBIN: 8.9 g/dL — AB (ref 12.0–15.0)
MCH: 31.9 pg (ref 26.0–34.0)
MCHC: 30.8 g/dL (ref 30.0–36.0)
MCV: 103.6 fL — AB (ref 78.0–100.0)
Platelets: 163 10*3/uL (ref 150–400)
RBC: 2.79 MIL/uL — AB (ref 3.87–5.11)
RDW: 13.7 % (ref 11.5–15.5)
WBC: 8.7 10*3/uL (ref 4.0–10.5)

## 2018-01-18 MED ORDER — FERROUS GLUCONATE 324 (38 FE) MG PO TABS
324.0000 mg | ORAL_TABLET | Freq: Three times a day (TID) | ORAL | Status: DC
  Administered 2018-01-19 – 2018-01-20 (×4): 324 mg via ORAL
  Filled 2018-01-18 (×6): qty 1

## 2018-01-18 MED ORDER — MAGNESIUM HYDROXIDE 400 MG/5ML PO SUSP
30.0000 mL | Freq: Every day | ORAL | Status: DC
  Administered 2018-01-18 – 2018-01-19 (×2): 30 mL via ORAL
  Filled 2018-01-18 (×3): qty 30

## 2018-01-18 MED ORDER — ALBUTEROL SULFATE (2.5 MG/3ML) 0.083% IN NEBU: 2.5000 mg | INHALATION_SOLUTION | RESPIRATORY_TRACT | Status: DC | PRN

## 2018-01-18 NOTE — Progress Notes (Signed)
Patient ID: Laura Solis, female   DOB: 12/20/1950, 67 y.o.   MRN: [id number] Complains of some congestion, eupnea (normal respirations) Oxygen sat 96%. She uses an albuterol inhaler intermittantly at home.  Anemia Hgb 8.9 this AM, IVF discontinued and I expect she may have some mild increased volumn. Check CBC in the AM. Start ferrous gluconate. She normally has low BP so if there is no tachycardia then she is probably normal especially if sats are normal.

## 2018-01-18 NOTE — Progress Notes (Signed)
Occupational Therapy Treatment Patient Details Name: EMELIA SANDOVAL MRN: [id number] DOB: [birth year] Today's Date: 01/18/2018    History of present illness Revision of pedicle screws L5 with mPACT pedicle screws with cement, new right L4-5 transforaminal lumbar interbody fusion, revision of cage Left L4-5 on 01/16/18. Patient with a PMH significant for anxiety, fibromyalgia, OA.   OT comments  Pt making progress towards goals. Pt verbalizing 2/3 back precautions and OT reviewing again. OT also providing functional examples and concerns when going home. Pt verbalized having LH reacher at home as well to increase I with self care tasks while maintaining precautions. OT providing education and demonstrate for use of toilet aid to increase I with hygiene after toileting. Pt returned demonstration with proper technique. Pt reporting increased concern with hygiene secondary to body habitus and OT provided education and recommendation for bidet attachment.   Follow Up Recommendations  No OT follow up;Supervision - Intermittent    Equipment Recommendations  None recommended by OT    Recommendations for Other Services      Precautions / Restrictions Precautions Precautions: Back Required Braces or Orthoses: Spinal Brace Spinal Brace: Lumbar corset;Applied in sitting position Restrictions Weight Bearing Restrictions: No       Mobility Bed Mobility               General bed mobility comments: pt seated in recliner chair         ADL either performed or assessed with clinical judgement        Vision Patient Visual Report: No change from baseline            Cognition Arousal/Alertness: Awake/alert Behavior During Therapy: WFL for tasks assessed/performed Overall Cognitive Status: Within Functional Limits for tasks assessed                           Pertinent Vitals/ Pain       Pain Assessment: 0-10 Pain Score: 4  Pain Descriptors / Indicators: Aching Pain  Intervention(s): Monitored during session         Frequency  Min 2X/week        Progress Toward Goals  OT Goals(current goals can now be found in the care plan section)  Progress towards OT goals: Progressing toward goals  Acute Rehab OT Goals Patient Stated Goal: to return home OT Goal Formulation: With patient Time For Goal Achievement: 02/01/18 Potential to Achieve Goals: Good  Plan Discharge plan remains appropriate       AM-PAC PT "6 Clicks" Daily Activity     Outcome Measure   Help from another person eating meals?: None Help from another person taking care of personal grooming?: None Help from another person toileting, which includes using toliet, bedpan, or urinal?: A Lot Help from another person bathing (including washing, rinsing, drying)?: A Little Help from another person to put on and taking off regular upper body clothing?: A Little   6 Click Score: 16    End of Session Equipment Utilized During Treatment: Back brace  OT Visit Diagnosis: Muscle weakness (generalized) (M62.81);Pain;Unsteadiness on feet (R26.81) Pain - part of body: (back)   Activity Tolerance Patient tolerated treatment well   Patient Left in chair;with call bell/phone within reach;with family/visitor present   Nurse Communication          Time: 1914-7829 OT Time Calculation (min): 17 min  Charges: OT General Charges $OT Visit: 1 Visit OT Treatments $Self Care/Home Management : 8-22 mins   Kayleena Eke,  Elizzie Westergard P,MS, OTR/L 01/18/2018, 11:07 AM

## 2018-01-18 NOTE — Care Management Note (Signed)
Case Management Note  Patient Details  Name: Laura Solis MRN: [id number] Date of Birth: 1950-09-17  Subjective/Objective:     Pt s/p lumbar surgery. She is from home alone.               Action/Plan: Pt with orders for [facility] services. CM met with the patient and she has used [facility] in the past and would like to use them again. CM notified [facility] and faxed them the required information: [phone]. Pt states she has all the needed DME at home.  CM following for any further d/c needs.   Expected Discharge Date:                  Expected Discharge Plan:  [place]  In-House Referral:     Discharge planning Services  CM Consult  Post Acute Care Choice:  Durable Medical Equipment, Home Health Choice offered to:  Patient  DME Arranged:  (pt has all needed DME) DME Agency:     HH Arranged:  PT [place]:  [facility]  Status of Service:  Completed, signed off  If discussed at [place] of Stay Meetings, dates discussed:    Additional Comments:  Pollie Friar, RN 01/18/2018, 2:01 PM

## 2018-01-18 NOTE — Progress Notes (Signed)
     Subjective: 2 Days Post-Op Procedure(s) (LRB): Revision of pedicle screws L5 with mPACT pedicle screws with cement, new right L4-5 transforaminal lumbar interbody fusion, revision of cage Left L4-5 (N/A) Awake, alert and oriented x4. Good color warm central but she does have cold hands. VSS. Fluid at 125 cc/hr. Will decrease IVF rate.  Difficulty with O2 sat due to cold hands  Patient reports pain as moderate.    Objective:   VITALS:  Temp:  [97.6 F (36.4 C)-98.3 F (36.8 C)] 97.6 F (36.4 C) (06/20 1535) Pulse Rate:  [76-88] 84 (06/20 1535) Resp:  [16-20] 20 (06/20 1535) BP: (98-113)/(47-68) 109/53 (06/20 1535) SpO2:  [94 %-100 %] 96 % (06/20 1535)  Neurologically intact ABD soft Neurovascular intact Sensation intact distally Intact pulses distally Dorsiflexion/Plantar flexion intact Incision: dressing C/D/I, no drainage and dressing intact,drain intact but decrease out put. No cellulitis present Compartment soft Hemovac removed without difficulty.    LABS Recent Labs    01/17/18 0702 01/17/18 1511 01/18/18 0409  HGB 9.8* 9.1* 8.9*  WBC 11.6* 10.9* 8.7  PLT 195 189 163   Recent Labs    01/17/18 0702 01/18/18 0409  NA 142 143  K 4.6 4.3  CL 108 109  CO2 27 29  BUN 16 13  CREATININE 0.94 0.74  GLUCOSE 145* 106*   No results for input(s): LABPT, INR in the last 72 hours.   Assessment/Plan: 2 Days Post-Op Procedure(s) (LRB): Revision of pedicle screws L5 with mPACT pedicle screws with cement, new right L4-5 transforaminal lumbar interbody fusion, revision of cage Left L4-5 (N/A) Anemia due to acute blood loss   Advance diet Up with therapy D/C IV fluids  Vira BrownsJames Nitka 01/18/2018, 6:07 PMPatient ID: Ulice BoldJoan M Schlup, female   DOB: 04/02/1951, 67 y.o.   MRN: [id number]

## 2018-01-18 NOTE — Progress Notes (Signed)
Physical Therapy Treatment Patient Details Name: Laura Solis MRN: [id number] DOB: 08-22-1950 Today's Date: 01/18/2018    History of Present Illness Revision of pedicle screws L5 with mPACT pedicle screws with cement, new right L4-5 transforaminal lumbar interbody fusion, revision of cage Left L4-5 on 01/16/18. Patient with a PMH significant for anxiety, fibromyalgia, OA.    PT Comments    Patient doing well today - motivated to ambulate with PT. Patient requiring Min guard for transfers and mobility as well as assist for line management. Definite need of arm rests for sit <> stand transfers but with no physical assist from PT provided. Able to progress ambulation distance in hallway with RW with good tolerance. SaO2 on 2L via [place] at 100% at rest; SaO2 on RA after activity 97-100% - nursing in room, able to remove supplemental O2 currently with nursing stating to continue to monitor patient on RA. Patient progressing towards goals.   Follow Up Recommendations  Home health PT;Supervision - Intermittent     Equipment Recommendations  None recommended by PT    Recommendations for Other Services       Precautions / Restrictions Precautions Precautions: Back Required Braces or Orthoses: Spinal Brace Spinal Brace: Lumbar corset;Applied in sitting position Restrictions Weight Bearing Restrictions: No    Mobility  Bed Mobility               General bed mobility comments: pt seated in recliner chair  Transfers Overall transfer level: Needs assistance Equipment used: Rolling walker (2 wheeled) Transfers: Sit to/from [employer] Sit to Stand: Min guard Stand pivot transfers: Min guard       General transfer comment: for safety and line management; sit <> stand from recliner and toilet with definite need of hand rails, but no physical assist from PT   Ambulation/Gait Ambulation/Gait assistance: Min guard Gait Distance (Feet): 80 Feet Assistive device:  Rolling walker (2 wheeled) Gait Pattern/deviations: Step-through pattern;Decreased stride length;Narrow base of support Gait velocity: decreased   General Gait Details: very slow pace   Optometrist    Modified Rankin (Stroke Patients Only)       Balance Overall balance assessment: Mild deficits observed, not formally tested                                          Cognition Arousal/Alertness: Awake/alert Behavior During Therapy: WFL for tasks assessed/performed Overall Cognitive Status: Within Functional Limits for tasks assessed                                        Exercises      General Comments        Pertinent Vitals/Pain Pain Assessment: 0-10 Pain Score: 3  Pain Descriptors / Indicators: Sore Pain Intervention(s): Limited activity within patient's tolerance;Monitored during session;Repositioned    Home Living                      Prior Function            PT Goals (current goals can now be found in the care plan section) Acute Rehab PT Goals Patient Stated Goal: to return home PT Goal Formulation: With patient Time For Goal Achievement: 01/24/18 Potential  to Achieve Goals: Good Progress towards PT goals: Progressing toward goals    Frequency    Min 5X/week      PT Plan Current plan remains appropriate    Co-evaluation              AM-PAC PT "6 Clicks" Daily Activity  Outcome Measure  Difficulty turning over in bed (including adjusting bedclothes, sheets and blankets)?: A Lot Difficulty moving from lying on back to sitting on the side of the bed? : A Lot Difficulty sitting down on and standing up from a chair with arms (e.g., wheelchair, bedside commode, etc,.)?: Unable Help needed moving to and from a bed to chair (including a wheelchair)?: A Little Help needed walking in hospital room?: A Little Help needed climbing 3-5 steps with a railing? : A Lot 6  Click Score: 13    End of Session Equipment Utilized During Treatment: Gait belt;Back brace Activity Tolerance: Patient tolerated treatment well Patient left: in chair;with nursing/sitter in room Nurse Communication: Mobility status PT Visit Diagnosis: Unsteadiness on feet (R26.81);Other abnormalities of gait and mobility (R26.89);Muscle weakness (generalized) (M62.81)     Time: [id number] PT Time Calculation (min) (ACUTE ONLY): 26 min  Charges:  $Gait Training: 8-22 mins $Therapeutic Activity: 8-22 mins                    G Codes:       Kipp LaurenceStephanie R Monta Police, PT, DPT 01/18/18 11:30 AM

## 2018-01-19 LAB — CBC
HCT: 32.4 % — ABNORMAL LOW (ref 36.0–46.0)
HEMOGLOBIN: 9.8 g/dL — AB (ref 12.0–15.0)
MCH: 31.6 pg (ref 26.0–34.0)
MCHC: 30.2 g/dL (ref 30.0–36.0)
MCV: 104.5 fL — ABNORMAL HIGH (ref 78.0–100.0)
Platelets: 185 10*3/uL (ref 150–400)
RBC: 3.1 MIL/uL — ABNORMAL LOW (ref 3.87–5.11)
RDW: 14.1 % (ref 11.5–15.5)
WBC: 8.5 10*3/uL (ref 4.0–10.5)

## 2018-01-19 MED ORDER — METHOCARBAMOL 500 MG PO TABS: 500.0000 mg | ORAL_TABLET | Freq: Three times a day (TID) | ORAL | 1 refills | Status: DC | PRN

## 2018-01-19 MED ORDER — OXYCODONE HCL 5 MG PO TABS: 5.0000 mg | ORAL_TABLET | ORAL | 0 refills | Status: DC | PRN

## 2018-01-19 MED ORDER — DIPHENHYDRAMINE HCL 25 MG PO CAPS: 25.0000 mg | ORAL_CAPSULE | Freq: Every evening | ORAL | 0 refills | Status: DC | PRN

## 2018-01-19 MED ORDER — OXYCODONE HCL ER 10 MG PO T12A: 10.0000 mg | EXTENDED_RELEASE_TABLET | Freq: Two times a day (BID) | ORAL | 0 refills | Status: DC

## 2018-01-19 MED ORDER — DOCUSATE SODIUM 100 MG PO CAPS: 100.0000 mg | ORAL_CAPSULE | Freq: Two times a day (BID) | ORAL | 0 refills | Status: DC

## 2018-01-19 MED ORDER — FERROUS GLUCONATE 324 (38 FE) MG PO TABS: 324.0000 mg | ORAL_TABLET | Freq: Three times a day (TID) | ORAL | 1 refills | Status: DC

## 2018-01-19 NOTE — Progress Notes (Signed)
     Subjective: 3 Days Post-Op Procedure(s) (LRB): Revision of pedicle screws L5 with mPACT pedicle screws with cement, new right L4-5 transforaminal lumbar interbody fusion, revision of cage Left L4-5 (N/A) Awake, Alert and oriented x 4. Trying to get by without pain meds during the day. Voiding without difficulty. No shortness of breath with standing and walking. Ride should be available tomorrow. Needs work on stairs for home.   Patient reports pain as moderate.    Objective:   VITALS:  Temp:  [97.5 F (36.4 C)-98.5 F (36.9 C)] 98.5 F (36.9 C) (06/21 1518) Pulse Rate:  [62-95] 89 (06/21 1518) Resp:  [16-20] 20 (06/21 1518) BP: (97-112)/(44-66) 97/49 (06/21 1518) SpO2:  [80 %-100 %] 94 % (06/21 1518)  Neurologically intact ABD soft Neurovascular intact Sensation intact distally Intact pulses distally Dorsiflexion/Plantar flexion intact Incision: scant drainage   LABS Recent Labs    01/17/18 0702 01/17/18 1511 01/18/18 0409 01/19/18 0331  HGB 9.8* 9.1* 8.9* 9.8*  WBC 11.6* 10.9* 8.7 8.5  PLT 195 189 163 185   Recent Labs    01/17/18 0702 01/18/18 0409  NA 142 143  K 4.6 4.3  CL 108 109  CO2 27 29  BUN 16 13  CREATININE 0.94 0.74  GLUCOSE 145* 106*   No results for input(s): LABPT, INR in the last 72 hours.   Assessment/Plan: 3 Days Post-Op Procedure(s) (LRB): Revision of pedicle screws L5 with mPACT pedicle screws with cement, new right L4-5 transforaminal lumbar interbody fusion, revision of cage Left L4-5 (N/A) Anemia due to blood loss stable.   Advance diet Up with therapy Plan for discharge tomorrow Discharge home with home health  Laura Solis 01/19/2018, 4:30 PMPatient ID: Laura BoldJoan M Solis, female   DOB: 09/19/1950, 67 y.o.   MRN: [id number]

## 2018-01-19 NOTE — Progress Notes (Signed)
Physical Therapy Treatment Patient Details Name: Ulice BoldJoan M Surette MRN: [id number] DOB: 02/25/1951 Today's Date: 01/19/2018    History of Present Illness Revision of pedicle screws L5 with mPACT pedicle screws with cement, new right L4-5 transforaminal lumbar interbody fusion, revision of cage Left L4-5 on 01/16/18. Patient with a PMH significant for anxiety, fibromyalgia, OA.    PT Comments    Patient received in bed - motivated to work with PT. Per chart review SaO2 dropping during night - supplemental O2 removed for activity with SaO2 remaining stable >90% on RA. Does still rely heavily on arm rests for sit to stand transfers but with no LOB or instability. Dr. Otelia SergeantNitka presenting at end of treatment session - advised PT to leave patient off of supplemental O2. Patient making good progress towards goals.     Follow Up Recommendations  Home health PT;Supervision - Intermittent     Equipment Recommendations  None recommended by PT    Recommendations for Other Services       Precautions / Restrictions Precautions Precautions: Back Required Braces or Orthoses: Spinal Brace Spinal Brace: Lumbar corset;Applied in sitting position Restrictions Weight Bearing Restrictions: No    Mobility  Bed Mobility Overal bed mobility: Needs Assistance Bed Mobility: Rolling;Sidelying to Sit Rolling: Supervision Sidelying to sit: Supervision       General bed mobility comments: good awareness of back precautions  Transfers Overall transfer level: Needs assistance Equipment used: Rolling walker (2 wheeled) Transfers: Sit to/from BJ'sStand;Stand Pivot Transfers Sit to Stand: Supervision Stand pivot transfers: Supervision       General transfer comment: SUP with no LOB, continued definite need of hand rails  Ambulation/Gait Ambulation/Gait assistance: Min guard Gait Distance (Feet): 120 Feet Assistive device: Rolling walker (2 wheeled) Gait Pattern/deviations: Step-through pattern;Decreased  stride length;Narrow base of support Gait velocity: decreased   General Gait Details: feeling well with gait; so subjective reports of dizziness/lightheadedness; good awareness of surroundings and obstacle navigation   Stairs             Wheelchair Mobility    Modified Rankin (Stroke Patients Only)       Balance Overall balance assessment: Mild deficits observed, not formally tested                                          Cognition Arousal/Alertness: Awake/alert Behavior During Therapy: WFL for tasks assessed/performed Overall Cognitive Status: Within Functional Limits for tasks assessed                                 General Comments: patient reports general fatigue/sleepiness      Exercises      General Comments        Pertinent Vitals/Pain Pain Assessment: 0-10 Pain Score: 3  Pain Descriptors / Indicators: Sore Pain Intervention(s): Limited activity within patient's tolerance;Monitored during session;Repositioned    Home Living                      Prior Function            PT Goals (current goals can now be found in the care plan section) Acute Rehab PT Goals Patient Stated Goal: to return home PT Goal Formulation: With patient Time For Goal Achievement: 01/24/18 Potential to Achieve Goals: Good Progress towards PT goals: Progressing toward goals  Frequency    Min 5X/week      PT Plan Current plan remains appropriate    Co-evaluation              AM-PAC PT "6 Clicks" Daily Activity  Outcome Measure  Difficulty turning over in bed (including adjusting bedclothes, sheets and blankets)?: A Little Difficulty moving from lying on back to sitting on the side of the bed? : A Little Difficulty sitting down on and standing up from a chair with arms (e.g., wheelchair, bedside commode, etc,.)?: A Little Help needed moving to and from a bed to chair (including a wheelchair)?: A Little Help  needed walking in hospital room?: A Little Help needed climbing 3-5 steps with a railing? : A Lot 6 Click Score: 17    End of Session Equipment Utilized During Treatment: Gait belt;Back brace Activity Tolerance: Patient tolerated treatment well Patient left: in chair;with call bell/phone within reach;with nursing/sitter in room Nurse Communication: Mobility status PT Visit Diagnosis: Unsteadiness on feet (R26.81);Other abnormalities of gait and mobility (R26.89);Muscle weakness (generalized) (M62.81)     Time: 1610-9604 PT Time Calculation (min) (ACUTE ONLY): 22 min  Charges:  $Gait Training: 8-22 mins                    G Codes:       Kipp Laurence, PT, DPT 01/19/18 8:45 AM

## 2018-01-19 NOTE — Care Management Important Message (Signed)
Important Message  Patient Details  Name: Laura BoldJoan M Macera MRN: [id number] Date of Birth: 02/16/1951   Medicare Important Message Given:  Yes    Kimo Bancroft P Jamilah Jean 01/19/2018, 12:41 PM

## 2018-01-19 NOTE — Plan of Care (Signed)
Patient continues to desat throughout the night while sleeping. Placed her on 2 liters of oxygen to prevent hr dropping into the low 80's/high 70's. She remained on continuous pulse ox for her safety.

## 2018-01-20 NOTE — Progress Notes (Signed)
Patient ID: [name], female   DOB: 09/05/1950, 67 y.o.   MRN: [id number] The patient looks good overall.  Her lumbar spine dressing is clean and dry.  She has good mobility.  Vitals are stable.  Plan to go home to daughters house today.

## 2018-01-20 NOTE — Progress Notes (Signed)
Patient discharged home. Discharge instructions were reviewed with the patient. Patient verbalized understanding.  

## 2018-01-20 NOTE — Discharge Summary (Signed)
Patient ID: Laura Solis MRN: [id number] DOB/AGE: 1951/01/09 67 y.o.  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 ADHD (attention deficit hyperactivity disorder)   . Anxiety    "from the fibromyalgia"  . Chronic lower back pain   . Fibromyalgia   . Headache    "bad one; monthly" (01/11/2017)  . Osteoarthritis    "all over" (01/11/2017)  . Pneumonia    "I've had it about 3 times" (01/11/2017)  . PONV (postoperative nausea and vomiting)   . Rotator cuff tear    left shoulder    Surgeries: Procedure(s): Revision of pedicle screws L5 with mPACT pedicle screws with cement, new right L4-5 transforaminal lumbar interbody fusion, revision of cage Left L4-5 on 01/16/2018   Consultants:   Discharged Condition: Improved  Hospital Course: Laura Solis is an 67 y.o. female who was admitted 01/16/2018 for operative treatment ofPseudarthrosis after fusion or arthrodesis. Patient has severe unremitting pain that affects sleep, daily activities, and work/hobbies. After pre-op clearance the patient was taken to the operating room on 01/16/2018 and underwent  Procedure(s): Revision of pedicle screws L5 with mPACT pedicle screws with cement, new right L4-5 transforaminal lumbar interbody fusion, revision of cage Left L4-5.    Patient was given perioperative antibiotics:  Anti-infectives (From admission, onward)   Start     Dose/Rate Route Frequency Ordered Stop   01/16/18 2000  ceFAZolin (ANCEF) IVPB 2g/100 mL premix     2 g 200 mL/hr over 30 Minutes Intravenous Every 8 hours 01/16/18 1741 01/17/18 0437   01/16/18 1802  valACYclovir (VALTREX) tablet 1,000 mg     1,000 mg Oral Every 12 hours PRN 01/16/18  1741     01/16/18 1258  vancomycin (VANCOCIN) powder  Status:  Discontinued       As needed 01/16/18 1259 01/16/18 1547   01/16/18 0600  ceFAZolin (ANCEF) IVPB 2g/100 mL premix     2 g 200 mL/hr over 30 Minutes Intravenous On call to O.R. 01/16/18 0515 01/16/18 1155       Patient was given sequential compression devices, early ambulation, and chemoprophylaxis to prevent DVT.  Patient benefited maximally from hospital stay and there were no complications.    Recent vital signs:  Patient Vitals for the past 24 hrs:  BP Temp Temp src Pulse Resp SpO2  01/20/18 0742 105/62 98 F (36.7 C) Oral 86 17 95 %  01/20/18 0410 (!) 98/43 98 F (36.7 C) Oral 89 18 98 %  01/20/18 0031 (!) 94/52 98.2 F (36.8 C) Oral 97 18 96 %  01/19/18 2058 (!) 112/56 99.1 F (37.3 C) Oral 91 18 92 %  01/19/18 1518 (!) 97/49 98.5 F (36.9 C) Oral 89 20 94 %  01/19/18 1139 (!) 105/50 98.4 F (36.9 C) Oral 95 16 92 %     Recent laboratory studies:  Recent Labs    01/18/18 0409 01/19/18 0331  WBC 8.7 8.5  HGB 8.9* 9.8*  HCT 28.9* 32.4*  PLT 163 185  NA 143  --   K 4.3  --   CL 109  --   CO2 29  --   BUN 13  --   CREATININE 0.74  --  GLUCOSE 106*  --   CALCIUM 8.1*  --      Discharge Medications:   Allergies as of 01/20/2018      Reactions   Other Other (See Comments)   Anesthesia causes nausea.       Medication List    STOP taking these medications   ADVIL PM 200-25 MG Caps Generic drug:  Ibuprofen-diphenhydrAMINE HCl   ibuprofen 600 MG tablet Commonly known as:  ADVIL,MOTRIN     TAKE these medications   ALPRAZolam 0.5 MG tablet Commonly known as:  XANAX Take 0.5 mg by mouth at bedtime.   amphetamine-dextroamphetamine 10 MG tablet Commonly known as:  ADDERALL Take 10 mg by mouth 2 (two) times daily.   diphenhydrAMINE 25 mg capsule Commonly known as:  BENADRYL Take 1 capsule (25 mg total) by mouth at bedtime as needed for itching, allergies or sleep.   docusate sodium 100  MG capsule Commonly known as:  COLACE Take 1 capsule (100 mg total) by mouth 2 (two) times daily.   ferrous gluconate 324 MG tablet Commonly known as:  FERGON Take 1 tablet (324 mg total) by mouth 3 (three) times daily with meals.   FLUoxetine 20 MG capsule Commonly known as:  PROZAC Take 20 mg by mouth at bedtime.   methocarbamol 500 MG tablet Commonly known as:  ROBAXIN Take 1 tablet (500 mg total) by mouth every 8 (eight) hours as needed for muscle spasms.   oxyCODONE 5 MG immediate release tablet Commonly known as:  Oxy IR/ROXICODONE Take 1 tablet (5 mg total) by mouth every 3 (three) hours as needed for moderate pain ((score 4 to 6)).   oxyCODONE 10 mg 12 hr tablet Commonly known as:  OXYCONTIN Take 1 tablet (10 mg total) by mouth every 12 (twelve) hours.   Potassium Chloride ER 20 MEQ Tbcr Take 2 tablets daily (40 meq) by mouth daily. What changed:    how much to take  how to take this  when to take this  additional instructions   pramipexole 1 MG tablet Commonly known as:  MIRAPEX Take 1.5 mg by mouth at bedtime.   valACYclovir 1000 MG tablet Commonly known as:  VALTREX TAKE 1 TABLET BY MOUTH EVERY 12 HOURS FOR 7 DAYS AS NEEDED FOR BREAKOUTS            Durable Medical Equipment  (From admission, onward)        Start     Ordered   01/16/18 1742  DME Walker rolling  Once    Question:  Patient needs a walker to treat with the following condition  Answer:  S/P lumbar spinal fusion   01/16/18 1741   01/16/18 1742  DME 3 n 1  Once     01/16/18 1741      Diagnostic Studies: Dg Lumbar Spine Complete  Result Date: 01/16/2018 CLINICAL DATA:  Revision of L5 pedicle screws. New L4-5 lumbar interbody fusion. EXAM: DG C-ARM 61-120 MIN; LUMBAR SPINE - COMPLETE 4+ VIEW Radiation exposure index: 61.82 mGy. COMPARISON:  Radiographs of October 30, 2017. FINDINGS: Four intraoperative fluoroscopic images were obtained of the lower lumbar spine. These demonstrate the  patient be status post surgical posterior fusion of L3-4, L4-5 and L5-S1. Interbody fusion of L3-4 and L4-5 is noted as well. Stable grade 1 anterolisthesis of L4-5 is noted. IMPRESSION: Status post surgical posterior fusion of L3-4, L4-5 and L5-S1. Electronically Signed   By: Lupita Raider, M.D.   On: 01/16/2018 15:15  Dg Chest Port 1 View  Result Date: 01/17/2018 CLINICAL DATA:  Hypoxia. EXAM: PORTABLE CHEST 1 VIEW COMPARISON:  Radiographs of May 12, 2017. FINDINGS: The heart size and mediastinal contours are within normal limits. Both lungs are clear. No pneumothorax or pleural effusion is noted. Bony thorax is unremarkable. IMPRESSION: No acute cardiopulmonary abnormality seen. Electronically Signed   By: Lupita RaiderJames  Green Jr, M.D.   On: 01/17/2018 09:27   Dg C-arm 1-60 Min  Result Date: 01/16/2018 CLINICAL DATA:  Revision of L5 pedicle screws. New L4-5 lumbar interbody fusion. EXAM: DG C-ARM 61-120 MIN; LUMBAR SPINE - COMPLETE 4+ VIEW Radiation exposure index: 61.82 mGy. COMPARISON:  Radiographs of October 30, 2017. FINDINGS: Four intraoperative fluoroscopic images were obtained of the lower lumbar spine. These demonstrate the patient be status post surgical posterior fusion of L3-4, L4-5 and L5-S1. Interbody fusion of L3-4 and L4-5 is noted as well. Stable grade 1 anterolisthesis of L4-5 is noted. IMPRESSION: Status post surgical posterior fusion of L3-4, L4-5 and L5-S1. Electronically Signed   By: Lupita RaiderJames  Green Jr, M.D.   On: 01/16/2018 15:15   Dg C-arm 1-60 Min  Result Date: 01/16/2018 CLINICAL DATA:  Revision of L5 pedicle screws. New L4-5 lumbar interbody fusion. EXAM: DG C-ARM 61-120 MIN; LUMBAR SPINE - COMPLETE 4+ VIEW Radiation exposure index: 61.82 mGy. COMPARISON:  Radiographs of October 30, 2017. FINDINGS: Four intraoperative fluoroscopic images were obtained of the lower lumbar spine. These demonstrate the patient be status post surgical posterior fusion of L3-4, L4-5 and L5-S1. Interbody  fusion of L3-4 and L4-5 is noted as well. Stable grade 1 anterolisthesis of L4-5 is noted. IMPRESSION: Status post surgical posterior fusion of L3-4, L4-5 and L5-S1. Electronically Signed   By: Lupita RaiderJames  Green Jr, M.D.   On: 01/16/2018 15:15   Dg C-arm 1-60 Min  Result Date: 01/16/2018 CLINICAL DATA:  Revision of L5 pedicle screws. New L4-5 lumbar interbody fusion. EXAM: DG C-ARM 61-120 MIN; LUMBAR SPINE - COMPLETE 4+ VIEW Radiation exposure index: 61.82 mGy. COMPARISON:  Radiographs of October 30, 2017. FINDINGS: Four intraoperative fluoroscopic images were obtained of the lower lumbar spine. These demonstrate the patient be status post surgical posterior fusion of L3-4, L4-5 and L5-S1. Interbody fusion of L3-4 and L4-5 is noted as well. Stable grade 1 anterolisthesis of L4-5 is noted. IMPRESSION: Status post surgical posterior fusion of L3-4, L4-5 and L5-S1. Electronically Signed   By: Lupita RaiderJames  Green Jr, M.D.   On: 01/16/2018 15:15    Disposition: Discharge disposition: 01-Home or Self Care       Discharge Instructions    Call MD / Call 911   Complete by:  As directed    If you experience chest pain or shortness of breath, CALL 911 and be transported to the hospital emergency room.  If you develope a fever above 101 F, pus (white drainage) or increased drainage or redness at the wound, or calf pain, call your surgeon's office.   Constipation Prevention   Complete by:  As directed    Drink plenty of fluids.  Prune juice may be helpful.  You may use a stool softener, such as Colace (over the counter) 100 mg twice a day.  Use MiraLax (over the counter) for constipation as needed.   Diet - low sodium heart healthy   Complete by:  As directed    Discharge instructions   Complete by:  As directed    Call if there is increasing drainage, fever greater than 101.5, severe head  aches, and worsening nausea or light sensitivity. If shortness of breath, bloody cough or chest tightness or pain go to an  emergency room. No lifting greater than 10 lbs. Avoid bending, stooping and twisting. Use brace when sitting and out of bed even to go to bathroom. Walk in house for first 2 weeks then may start to get out slowly increasing distances up to one mile by 4-6 weeks post op. After 5 days may shower and change dressing following bathing with shower.When bathing remove the brace shower and replace brace before getting out of the shower. If drainage, keep dry dressing and do not bathe the incision, use an moisture impervious dressing. Please call and return for scheduled follow up appointment 2 weeks from the time of surgery.   Driving restrictions   Complete by:  As directed    No driving for 4 weeks   Increase activity slowly as tolerated   Complete by:  As directed    Lifting restrictions   Complete by:  As directed    No lifting for 12 weeks      Follow-up Information    Kerrin Champagne, MD In 2 weeks.   Specialty:  Orthopedic Surgery Why:  For wound re-check Contact information: [address] [place] [address] [phone]            Signed: Kathryne Hitch 01/20/2018, 8:31 AM

## 2018-01-31 ENCOUNTER — Ambulatory Visit (INDEPENDENT_AMBULATORY_CARE_PROVIDER_SITE_OTHER): Payer: Medicare Other | Admitting: Specialist

## 2018-01-31 ENCOUNTER — Encounter (INDEPENDENT_AMBULATORY_CARE_PROVIDER_SITE_OTHER): Payer: Self-pay | Admitting: Specialist

## 2018-01-31 ENCOUNTER — Ambulatory Visit (INDEPENDENT_AMBULATORY_CARE_PROVIDER_SITE_OTHER): Payer: Medicare Other

## 2018-01-31 VITALS — BP 111/67 | HR 80 | Ht 65.0 in | Wt 217.0 lb

## 2018-01-31 DIAGNOSIS — T84498A Other mechanical complication of other internal orthopedic devices, implants and grafts, initial encounter: Secondary | ICD-10-CM

## 2018-01-31 DIAGNOSIS — M4326 Fusion of spine, lumbar region: Secondary | ICD-10-CM

## 2018-01-31 MED ORDER — OXYCODONE HCL ER 10 MG PO T12A: 10.0000 mg | EXTENDED_RELEASE_TABLET | Freq: Two times a day (BID) | ORAL | 0 refills | Status: DC

## 2018-01-31 NOTE — Patient Instructions (Signed)
Avoid frequent bending and stooping  No lifting greater than 10 lbs. May use ice or moist heat for pain. Weight loss is of benefit. Handicap license is approved. May bath this evening and as needed, After a week if the steristrips are still there you may remove the last ones.

## 2018-01-31 NOTE — Progress Notes (Signed)
Post-Op Visit Note   Patient: Laura Solis           Date of Birth: 1950/12/25           MRN: [id number] Visit Date: 01/31/2018 PCP: Joaquin Courts, DO   Assessment & Plan:2 weeks post Revision arthrodesis PLIF at L4-5 and pedicle screws and rods L3 toL5 new screws S1 bilateral.   Chief Complaint:  Chief Complaint  Patient presents with  . Lower Back - Routine Post Op   Visit Diagnoses:  1. 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 Fusion of spine of lumbar region   Incision is healed no drainage. Minimal staple irritation erythrema. Legs NV intact. Radiographs without abnormality.  Plan:Avoid frequent bending and stooping  No lifting greater than 10 lbs. May use ice or moist heat for pain. Weight loss is of benefit. Handicap license is approved. May bath this evening and as needed, After a week if the steristrips are still there you may remove the last ones.   Follow-Up Instructions: No follow-ups on file.   Orders:  Orders Placed This Encounter  Procedures  . XR Lumbar Spine 2-3 Views   No orders of the defined types were placed in this encounter.   Imaging: Xr Lumbar Spine 2-3 Views  Result Date: 01/31/2018 e of pedicle screws and rods L3 to S1 in good position and alignment the cages at L3-4 and  L4-5 are in good position and aligment. No abnormality noted. AP and lateral radiographs of the lumbar spine show the presence   PMFS History: Patient Active Problem List   Diagnosis Date Noted  . Anemia associated with acute blood loss 01/17/2018    Priority: High    Class: Acute  . 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 Pseudarthrosis after fusion or arthrodesis 01/16/2018    Priority: High    Class: Chronic  . S/P lumbar spinal fusion 01/16/2018  . Impingement syndrome of left shoulder 12/14/2017  . S/P lumbar fusion 01/26/2017  . Postoperative urinary retention 01/11/2017  . Lumbar stenosis  01/09/2017  . Restless legs 11/14/2016  . Osteoarthritis 11/14/2016  . 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 Attention deficit hyperactivity disorder 11/14/2016  . Anxiety 11/14/2016  . Adult attention deficit hyperactivity disorder 11/14/2016  . Osteoarthritis of right knee 10/03/2012  . Fibromyalgia syndrome 10/03/2012  . Obesity (BMI 30.0-34.9) 10/03/2012   Past Medical History:  Diagnosis Date  . ADHD (attention deficit hyperactivity disorder)   . Anxiety    "from the fibromyalgia"  . Chronic lower back pain   . Fibromyalgia   . Headache    "bad one; monthly" (01/11/2017)  . Osteoarthritis    "all over" (01/11/2017)  . Pneumonia    "I've had it about 3 times" (01/11/2017)  . PONV (postoperative nausea and vomiting)   . Rotator cuff tear    left shoulder    No family history on file.  Past Surgical History:  Procedure Laterality Date  . BACK SURGERY    . CARPAL TUNNEL RELEASE Bilateral   . CYST EXCISION     behind rt ear,coxxyx  . CYSTECTOMY N/A    from spine.   Marland Kitchen DILATION AND CURETTAGE OF UTERUS    . SHOULDER OPEN ROTATOR CUFF REPAIR Bilateral   . TONSILLECTOMY    . TOTAL KNEE ARTHROPLASTY Right 10/02/2012   Procedure: TOTAL KNEE ARTHROPLASTY;  Surgeon: Valeria Batman,  MD;  Location: [facility] OR;  Service: Orthopedics;  Laterality: Right;  Right Total Knee Arthroplasty  . TRANSFORAMINAL LUMBAR INTERBODY FUSION (TLIF) WITH PEDICLE SCREW FIXATION 2 LEVEL Left 01/09/2017   L3-4, L4-5/notes 01/09/2017  . TUBAL LIGATION    . VAGINAL HYSTERECTOMY     Social History   Occupational History  . Not on file  Tobacco Use  . Smoking status: Never Smoker  . Smokeless tobacco: Never Used  Substance and Sexual Activity  . Alcohol use: No  . Drug use: No  . Sexual activity: Not Currently

## 2018-02-11 ENCOUNTER — Other Ambulatory Visit (INDEPENDENT_AMBULATORY_CARE_PROVIDER_SITE_OTHER): Payer: Self-pay | Admitting: Specialist

## 2018-02-12 NOTE — Telephone Encounter (Signed)
ferrous gluconate refill request 

## 2018-03-08 ENCOUNTER — Encounter (INDEPENDENT_AMBULATORY_CARE_PROVIDER_SITE_OTHER): Payer: Self-pay | Admitting: Surgery

## 2018-03-08 ENCOUNTER — Ambulatory Visit (INDEPENDENT_AMBULATORY_CARE_PROVIDER_SITE_OTHER): Payer: Medicare Other | Admitting: Surgery

## 2018-03-08 DIAGNOSIS — M4326 Fusion of spine, lumbar region: Secondary | ICD-10-CM

## 2018-03-08 NOTE — Progress Notes (Signed)
Post-Op Visit Note   Patient: Laura Solis           Date of Birth: 05-25-1951           MRN: [id number] Visit Date: 03/08/2018 PCP: Joaquin Courts, DO   Assessment & Plan:  Chief Complaint:  Chief Complaint  Patient presents with  . Lower Back - Routine Post Op  Patient returns for recheck after lumbar fusion.  Patient comes in today without her brace.  She stated that Dr. Otelia Sergeant never told her that she needed to wear the brace.  Stated that she only wears this occasionally when she has at home.  She has been out driving.  I did show her Dr. Ferdie Ping his preop office note from April 2019 that states that she will wear her brace x3 months and then wean out the fourth month.   Visit Diagnoses:  1. Fusion of spine of lumbar region   Noncompliance with use of lumbar brace postop  Plan: I advised patient that she must wear lumbar brace until at least 12 weeks postop stressed to her the importance of being compliant.  I advised patient that Dr. Otelia Sergeant perform most recent surgery due to pseudoarthrosis and loosening of hardware.  Patient admitted today that she did not wear her brace after her last fusion surgery.  Stated that she would now be compliant.  With Dr. Otelia Sergeant in 5 weeks for recheck with x-rays.  Follow-Up Instructions: Return in about 5 weeks (around 04/12/2018) for with Dr Otelia Sergeant.   Orders:  No orders of the defined types were placed in this encounter.  No orders of the defined types were placed in this encounter.   Imaging: No results found.  PMFS History: Patient Active Problem List   Diagnosis Date Noted  . Anemia associated with acute blood loss 01/17/2018    Class: Acute  . 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 Pseudarthrosis after fusion or arthrodesis 01/16/2018    Class: Chronic  . S/P lumbar spinal fusion 01/16/2018  . Impingement syndrome of left shoulder 12/14/2017  . S/P lumbar fusion 01/26/2017  . Postoperative urinary  retention 01/11/2017  . Lumbar stenosis 01/09/2017  . Restless legs 11/14/2016  . Osteoarthritis 11/14/2016  . 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 Attention deficit hyperactivity disorder 11/14/2016  . Anxiety 11/14/2016  . Adult attention deficit hyperactivity disorder 11/14/2016  . Osteoarthritis of right knee 10/03/2012  . Fibromyalgia syndrome 10/03/2012  . Obesity (BMI 30.0-34.9) 10/03/2012   Past Medical History:  Diagnosis Date  . ADHD (attention deficit hyperactivity disorder)   . Anxiety    "from the fibromyalgia"  . Chronic lower back pain   . Fibromyalgia   . Headache    "bad one; monthly" (01/11/2017)  . Osteoarthritis    "all over" (01/11/2017)  . Pneumonia    "I've had it about 3 times" (01/11/2017)  . PONV (postoperative nausea and vomiting)   . Rotator cuff tear    left shoulder    History reviewed. No pertinent family history.  Past Surgical History:  Procedure Laterality Date  . BACK SURGERY    . CARPAL TUNNEL RELEASE Bilateral   . CYST EXCISION     behind rt ear,coxxyx  . CYSTECTOMY N/A    from spine.   Marland Kitchen DILATION AND CURETTAGE OF UTERUS    . SHOULDER OPEN ROTATOR CUFF REPAIR Bilateral   . TONSILLECTOMY    . TOTAL KNEE ARTHROPLASTY  Right 10/02/2012   Procedure: TOTAL KNEE ARTHROPLASTY;  Surgeon: Valeria BatmanPeter W [name], MD;  [facility];  Service: Orthopedics;  Laterality: Right;  Right Total Knee Arthroplasty  . TRANSFORAMINAL LUMBAR INTERBODY FUSION (TLIF) WITH PEDICLE SCREW FIXATION 2 LEVEL Left 01/09/2017   L3-4, L4-5/notes 01/09/2017  . TUBAL LIGATION    . VAGINAL HYSTERECTOMY     Social History   Occupational History  . Not on file  Tobacco Use  . Smoking status: Never Smoker  . Smokeless tobacco: Never Used  Substance and Sexual Activity  . Alcohol use: No  . Drug use: No  . Sexual activity: Not Currently

## 2018-03-15 ENCOUNTER — Other Ambulatory Visit (INDEPENDENT_AMBULATORY_CARE_PROVIDER_SITE_OTHER): Payer: Self-pay | Admitting: Specialist

## 2018-03-15 NOTE — Telephone Encounter (Signed)
ferrous gluconate refill request 

## 2018-04-04 ENCOUNTER — Other Ambulatory Visit (INDEPENDENT_AMBULATORY_CARE_PROVIDER_SITE_OTHER): Payer: Self-pay | Admitting: Specialist

## 2018-04-11 ENCOUNTER — Encounter (INDEPENDENT_AMBULATORY_CARE_PROVIDER_SITE_OTHER): Payer: Self-pay | Admitting: Orthopaedic Surgery

## 2018-04-11 ENCOUNTER — Ambulatory Visit (INDEPENDENT_AMBULATORY_CARE_PROVIDER_SITE_OTHER): Payer: Medicare Other | Admitting: Orthopaedic Surgery

## 2018-04-11 ENCOUNTER — Ambulatory Visit (INDEPENDENT_AMBULATORY_CARE_PROVIDER_SITE_OTHER): Payer: Medicare Other

## 2018-04-11 VITALS — BP 118/68 | HR 81 | Ht 65.0 in | Wt 217.0 lb

## 2018-04-11 DIAGNOSIS — G8929 Other chronic pain: Secondary | ICD-10-CM

## 2018-04-11 DIAGNOSIS — M1712 Unilateral primary osteoarthritis, left knee: Secondary | ICD-10-CM | POA: Diagnosis not present

## 2018-04-11 DIAGNOSIS — M170 Bilateral primary osteoarthritis of knee: Secondary | ICD-10-CM | POA: Insufficient documentation

## 2018-04-11 DIAGNOSIS — M25562 Pain in left knee: Secondary | ICD-10-CM

## 2018-04-11 MED ORDER — METHYLPREDNISOLONE ACETATE 40 MG/ML IJ SUSP
80.0000 mg | INTRAMUSCULAR | Status: AC | PRN
  Administered 2018-04-11: 80 mg

## 2018-04-11 MED ORDER — BUPIVACAINE HCL 0.5 % IJ SOLN
2.0000 mL | INTRAMUSCULAR | Status: AC | PRN
  Administered 2018-04-11: 2 mL via INTRA_ARTICULAR

## 2018-04-11 MED ORDER — LIDOCAINE HCL 1 % IJ SOLN
2.0000 mL | INTRAMUSCULAR | Status: AC | PRN
  Administered 2018-04-11: 2 mL

## 2018-04-11 NOTE — Progress Notes (Signed)
Office Visit Note   Patient: Laura Solis           Date of Birth: [birth year]           MRN: [id number] Visit Date: 04/11/2018              Requested by: Joaquin Courts, DO [address] [place], [place] [address] PCP: Joaquin Courts, DO   Assessment & Plan: Visit Diagnoses:  1. Chronic pain of left knee   2. Bilateral primary osteoarthritis of knee     Plan: Nearly end-stage osteoarthritis left knee.  Long discussion regarding diagnosis and treatment options.  Mrs. Holford is about 6 to 7 years status post successful right total knee replacement and is aware of the procedure and the fact that it would be the definitive procedure for the left.  She like to wait.  I will inject her knee with cortisone and monitor her response.  Follow-Up Instructions: Return if symptoms worsen or fail to improve.   Orders:  Orders Placed This Encounter  Procedures  . Large Joint Inj: L knee  . XR KNEE 3 VIEW LEFT   No orders of the defined types were placed in this encounter.     Procedures: Large Joint Inj: L knee on 04/11/2018 10:59 AM Indications: pain and diagnostic evaluation Details: 25 G 1.5 in needle, anteromedial approach  Arthrogram: No  Medications: 2 mL lidocaine 1 %; 2 mL bupivacaine 0.5 %; 80 mg methylPREDNISolone acetate 40 MG/ML Procedure, treatment alternatives, risks and benefits explained, specific risks discussed. Consent was given by the patient. Patient was prepped and draped in the usual sterile fashion.       Clinical Data: No additional findings.   Subjective: Chief Complaint  Patient presents with  . New Patient (Initial Visit)    L KNEE PAIN FOR SEV YRS JUST GETTING WORSE WOULD LIKE DISCUSS INJECTIONS  Mrs Pavich is about 6 to 7 years status post successful right total knee replacement and is very happy with the results.  She has had progressive problems with her left knee with recurrent episodes of swelling, pain and achiness.  She is  at the point of compromise and wanted to discuss treatment options.  She does have chronic back pain is had 3 surgeries since her knee replacement.  She presently is on chronic oxycodone, OxyContin, Prozac, Robaxin and Xanax.  HPI  Review of Systems  Constitutional: Negative for fatigue and fever.  HENT: Negative for ear pain.   Eyes: Negative for pain.  Respiratory: Negative for cough and shortness of breath.   Cardiovascular: Negative for leg swelling.  Gastrointestinal: Negative for constipation and diarrhea.  Genitourinary: Negative for difficulty urinating.  Musculoskeletal: Positive for back pain. Negative for neck pain.  Skin: Negative for rash.  Allergic/Immunologic: Negative for food allergies.  Neurological: Positive for weakness. Negative for numbness.  Hematological: Bruises/bleeds easily.  Psychiatric/Behavioral: Positive for sleep disturbance.     Objective: Vital Signs: BP 118/68 (BP Location: Right Arm, Patient Position: Sitting, Cuff Size: Normal)   Pulse 81   Ht 5\' 5"  (1.651 m)   Wt 217 lb (98.4 kg)   BMI 36.11 kg/m   Physical Exam  Constitutional: She is oriented to person, place, and time. She appears well-developed and well-nourished.  HENT:  Mouth/Throat: Oropharynx is clear and moist.  Eyes: Pupils are equal, round, and reactive to light. EOM are normal.  Pulmonary/Chest: Effort normal.  Neurological: She is alert and oriented to person, place, and time.  Skin: Skin is warm and dry.  Psychiatric: She has a normal mood and affect. Her behavior is normal.    Ortho Exam awake alert and oriented x3.  Comfortable sitting.  Left knee with some medial and lateral joint pain.  Some patellar crepitation.  Full extension of flexion over 100 degrees.  No instability.  No distal edema.  Good capillary refill to toes.  No pain with range of motion of left hip.  Normal clinical alignment  Specialty Comments:  No specialty comments available.  Imaging: Xr Knee 3  View Left  Result Date: 04/11/2018 The left knee were obtained in 3 projections standing.  There are tricompartmental degenerative changes predominantly patellofemoral joint and lateral compartment.  There is some ectopic calcification consistent with calcium phosphate deposition.  There are osteophytes and subchondral sclerosis and narrowing of the joint.  Slight increased valgus.  There is considerable degenerative change at the patellofemoral joint with lateral tilt and significant narrowing at the lateral patellofemoral articulation.  Large subchondral cysts and peripheral osteophytes    PMFS History: Patient Active Problem List   Diagnosis Date Noted  . Chronic pain of left knee 04/11/2018  . Bilateral primary osteoarthritis of knee 04/11/2018  . Anemia associated with acute blood loss 01/17/2018    Class: Acute  . 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 Pseudarthrosis after fusion or arthrodesis 01/16/2018    Class: Chronic  . S/P lumbar spinal fusion 01/16/2018  . Impingement syndrome of left shoulder 12/14/2017  . S/P lumbar fusion 01/26/2017  . Postoperative urinary retention 01/11/2017  . Lumbar stenosis 01/09/2017  . Restless legs 11/14/2016  . Osteoarthritis 11/14/2016  . 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 Attention deficit hyperactivity disorder 11/14/2016  . Anxiety 11/14/2016  . Adult attention deficit hyperactivity disorder 11/14/2016  . Osteoarthritis of right knee 10/03/2012  . Fibromyalgia syndrome 10/03/2012  . Obesity (BMI 30.0-34.9) 10/03/2012   Past Medical History:  Diagnosis Date  . ADHD (attention deficit hyperactivity disorder)   . Anxiety    "from the fibromyalgia"  . Chronic lower back pain   . Fibromyalgia   . Headache    "bad one; monthly" (01/11/2017)  . Osteoarthritis    "all over" (01/11/2017)  . Pneumonia    "I've had it about 3 times" (01/11/2017)  . PONV (postoperative nausea and vomiting)     . Rotator cuff tear    left shoulder    History reviewed. No pertinent family history.  Past Surgical History:  Procedure Laterality Date  . BACK SURGERY    . CARPAL TUNNEL RELEASE Bilateral   . CYST EXCISION     behind rt ear,coxxyx  . CYSTECTOMY N/A    from spine.   Marland Kitchen DILATION AND CURETTAGE OF UTERUS    . SHOULDER OPEN ROTATOR CUFF REPAIR Bilateral   . TONSILLECTOMY    . TOTAL KNEE ARTHROPLASTY Right 10/02/2012   Procedure: TOTAL KNEE ARTHROPLASTY;  Surgeon: Valeria Batman, MD;  Location: [facility] OR;  Service: Orthopedics;  Laterality: Right;  Right Total Knee Arthroplasty  . TRANSFORAMINAL LUMBAR INTERBODY FUSION (TLIF) WITH PEDICLE SCREW FIXATION 2 LEVEL Left 01/09/2017   L3-4, L4-5/notes 01/09/2017  . TUBAL LIGATION    . VAGINAL HYSTERECTOMY     Social History   Occupational History  . Not on file  Tobacco Use  . Smoking status: Never Smoker  . Smokeless tobacco: Never Used  Substance and Sexual Activity  . Alcohol use: No  .  Drug use: No  . Sexual activity: Not Currently

## 2018-04-23 ENCOUNTER — Ambulatory Visit (INDEPENDENT_AMBULATORY_CARE_PROVIDER_SITE_OTHER): Payer: Medicare Other | Admitting: Specialist

## 2018-04-23 ENCOUNTER — Ambulatory Visit (INDEPENDENT_AMBULATORY_CARE_PROVIDER_SITE_OTHER): Payer: Medicare Other

## 2018-04-23 ENCOUNTER — Encounter (INDEPENDENT_AMBULATORY_CARE_PROVIDER_SITE_OTHER): Payer: Self-pay | Admitting: Specialist

## 2018-04-23 VITALS — BP 136/69 | HR 90 | Ht 65.0 in | Wt 217.0 lb

## 2018-04-23 DIAGNOSIS — M4326 Fusion of spine, lumbar region: Secondary | ICD-10-CM | POA: Diagnosis not present

## 2018-04-23 NOTE — Progress Notes (Addendum)
Post-Op Visit Note   Patient: Laura Solis           Date of Birth: 1951-06-24           MRN: [id number] Visit Date: 04/23/2018 PCP: Joaquin Courts, DO   Assessment & Plan:  Chief Complaint:  Chief Complaint  Patient presents with  . Lower Back - Follow-up   Visit Diagnoses:  1. Fusion of spine of lumbar region     Plan: Avoid frequent bending and stooping  No lifting greater than 10 lbs. May use ice or moist heat for pain. Weight loss is of benefit. Best medication for lumbar disc disease is arthritis medications like tylenol. Exercise is important to improve your indurance and does allow people to function better inspite of back pain. Vitamin D 800 International units per day and 1500mg  per day, 200mg  in each dairy portion. 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  Follow-Up Instructions: Return in about 3 months (around 07/23/2018).   Orders:  Orders Placed This Encounter  Procedures  . XR Lumbar Spine 2-3 Views   No orders of the defined types were placed in this encounter.   Imaging: Xr Lumbar Spine 2-3 Views  Result Date: 04/23/2018 Ap and lateral flexion and extension radiographs show Lumbar fusion L3 to S1 with interbody fusion L3-4 and L4-5, posterior pedicle screws and rods L3 to S1, no loosening of hardware. With flexion and extension there is a difference greater than 1 mm across similar points over the anterior lumbar spine but no lucency and screw spread is changed on the 2 views, no loosening of hardware, early callous formation L4-5 interspace.   PMFS History: Patient Active Problem List   Diagnosis Date Noted  . Anemia associated with acute blood loss 01/17/2018    Priority: High    Class: Acute  . 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 Pseudarthrosis after fusion or arthrodesis 01/16/2018    Priority: High    Class: Chronic  . Chronic pain of left knee  04/11/2018  . Bilateral primary osteoarthritis of knee 04/11/2018  . S/P lumbar spinal fusion 01/16/2018  . Impingement syndrome of left shoulder 12/14/2017  . S/P lumbar fusion 01/26/2017  . Postoperative urinary retention 01/11/2017  . Lumbar stenosis 01/09/2017  . Restless legs 11/14/2016  . Osteoarthritis 11/14/2016  . 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 Attention deficit hyperactivity disorder 11/14/2016  . Anxiety 11/14/2016  . Adult attention deficit hyperactivity disorder 11/14/2016  . Osteoarthritis of right knee 10/03/2012  . Fibromyalgia syndrome 10/03/2012  . Obesity (BMI 30.0-34.9) 10/03/2012   Past Medical History:  Diagnosis Date  . ADHD (attention deficit hyperactivity disorder)   . Anxiety    "from the fibromyalgia"  . Chronic lower back pain   . Fibromyalgia   . Headache    "bad one; monthly" (01/11/2017)  . Osteoarthritis    "all over" (01/11/2017)  . Pneumonia    "I've had it about 3 times" (01/11/2017)  . PONV (postoperative nausea and vomiting)   . Rotator cuff tear    left shoulder    History reviewed. No pertinent family history.  Past Surgical History:  Procedure Laterality Date  . BACK SURGERY    . CARPAL TUNNEL RELEASE Bilateral   . CYST EXCISION     behind rt ear,coxxyx  . CYSTECTOMY N/A    from spine.   Marland Kitchen  DILATION AND CURETTAGE OF UTERUS    . SHOULDER OPEN ROTATOR CUFF REPAIR Bilateral   . TONSILLECTOMY    . TOTAL KNEE ARTHROPLASTY Right 10/02/2012   Procedure: TOTAL KNEE ARTHROPLASTY;  Surgeon: Valeria BatmanPeter W [name], MD;  [facility];  Service: Orthopedics;  Laterality: Right;  Right Total Knee Arthroplasty  . TRANSFORAMINAL LUMBAR INTERBODY FUSION (TLIF) WITH PEDICLE SCREW FIXATION 2 LEVEL Left 01/09/2017   L3-4, L4-5/notes 01/09/2017  . TUBAL LIGATION    . VAGINAL HYSTERECTOMY     Social History   Occupational History  . Not on file  Tobacco Use  . Smoking status: Never Smoker  . Smokeless tobacco: Never Used    Substance and Sexual Activity  . Alcohol use: No  . Drug use: No  . Sexual activity: Not Currently

## 2018-04-23 NOTE — Patient Instructions (Addendum)
  Plan: Avoid frequent bending and stooping  No lifting greater than 10 lbs. May use ice or moist heat for pain. Weight loss is of benefit. Best medication for lumbar disc disease is arthritis medications like tylenol. Exercise is important to improve your indurance and does allow people to function better inspite of back pain. 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

## 2018-07-27 ENCOUNTER — Ambulatory Visit (INDEPENDENT_AMBULATORY_CARE_PROVIDER_SITE_OTHER): Payer: Medicare Other | Admitting: Orthopaedic Surgery

## 2018-07-27 ENCOUNTER — Ambulatory Visit (INDEPENDENT_AMBULATORY_CARE_PROVIDER_SITE_OTHER): Payer: Medicare Other | Admitting: Specialist

## 2018-09-12 ENCOUNTER — Ambulatory Visit (INDEPENDENT_AMBULATORY_CARE_PROVIDER_SITE_OTHER): Payer: Medicare Other | Admitting: Orthopaedic Surgery

## 2018-09-26 ENCOUNTER — Encounter (INDEPENDENT_AMBULATORY_CARE_PROVIDER_SITE_OTHER): Payer: Self-pay | Admitting: Orthopaedic Surgery

## 2018-09-26 ENCOUNTER — Ambulatory Visit (INDEPENDENT_AMBULATORY_CARE_PROVIDER_SITE_OTHER): Payer: Medicare Other | Admitting: Orthopaedic Surgery

## 2018-09-26 VITALS — BP 131/74 | HR 95 | Ht 65.5 in | Wt 214.0 lb

## 2018-09-26 DIAGNOSIS — M170 Bilateral primary osteoarthritis of knee: Secondary | ICD-10-CM | POA: Diagnosis not present

## 2018-09-26 MED ORDER — BUPIVACAINE HCL 0.5 % IJ SOLN
2.0000 mL | INTRAMUSCULAR | Status: AC | PRN
  Administered 2018-09-26: 2 mL via INTRA_ARTICULAR

## 2018-09-26 MED ORDER — METHYLPREDNISOLONE ACETATE 40 MG/ML IJ SUSP
80.0000 mg | INTRAMUSCULAR | Status: AC | PRN
  Administered 2018-09-26: 80 mg via INTRA_ARTICULAR

## 2018-09-26 MED ORDER — LIDOCAINE HCL 1 % IJ SOLN
2.0000 mL | INTRAMUSCULAR | Status: AC | PRN
  Administered 2018-09-26: 2 mL

## 2018-09-26 NOTE — Progress Notes (Signed)
Office Visit Note   Patient: Laura Solis           Date of Birth: Mar 13, 1951           MRN: [id number] Visit Date: 09/26/2018              Requested by: Joaquin Courts, DO [address] [place], [place] [address] PCP: Joaquin Courts, DO   Assessment & Plan: Visit Diagnoses:  1. Bilateral primary osteoarthritis of knee     Plan: Recurrent symptoms of osteoarthritis left knee.  Had cortisone injection in September that made a difference.  Has had a prior successful right total knee replacement.  Will reinject left knee with cortisone follow-up as needed  Follow-Up Instructions: Return if symptoms worsen or fail to improve.   Orders:  Orders Placed This Encounter  Procedures  . Large Joint Inj: L knee   No orders of the defined types were placed in this encounter.     Procedures: Large Joint Inj: L knee on 09/26/2018 11:38 AM Indications: pain and diagnostic evaluation Details: 25 G 1.5 in needle, anteromedial approach  Arthrogram: No  Medications: 2 mL lidocaine 1 %; 2 mL bupivacaine 0.5 %; 80 mg methylPREDNISolone acetate 40 MG/ML Procedure, treatment alternatives, risks and benefits explained, specific risks discussed. Consent was given by the patient. Patient was prepped and draped in the usual sterile fashion.       Clinical Data: No additional findings.   Subjective: Chief Complaint  Patient presents with  . Left Knee - Pain  Patient presents today with left knee pain. She said that it is grinding, popping, and giving way. Her pain level is a 10. She had an injection in her knee more than 39months ago, and is wanting to get one today. She has been using heat/ice and Ibuprofen. She is wanting to get through the summer without surgery. She has a history of knee replacement on the right side.   HPI  Review of Systems   Objective: Vital Signs: BP 131/74   Pulse 95   Ht 5' 5.5" (1.664 m)   Wt 214 lb (97.1 kg)   BMI 35.07 kg/m   Physical  Exam Constitutional:      Appearance: She is well-developed.  Eyes:     Pupils: Pupils are equal, round, and reactive to light.  Pulmonary:     Effort: Pulmonary effort is normal.  Skin:    General: Skin is warm and dry.  Neurological:     Mental Status: She is alert and oriented to person, place, and time.  Psychiatric:        Behavior: Behavior normal.     Ortho Exam awake alert and oriented x3.  Comfortable sitting.  Right knee with well-healed incision related to her total knee replacement and no related symptoms.  Left knee without effusion.  Mostly medial joint pain .slight varus.  No distal edema.  Full extension and flexion over 100 degrees without instability Specialty Comments:  No specialty comments available.  Imaging: No results found.   PMFS History: Patient Active Problem List   Diagnosis Date Noted  . Chronic pain of left knee 04/11/2018  . Bilateral primary osteoarthritis of knee 04/11/2018  . Anemia associated with acute blood loss 01/17/2018    Class: Acute  . 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 Pseudarthrosis after fusion or arthrodesis 01/16/2018    Class: Chronic  . S/P lumbar spinal fusion 01/16/2018  .  Impingement syndrome of left shoulder 12/14/2017  . S/P lumbar fusion 01/26/2017  . Postoperative urinary retention 01/11/2017  . Lumbar stenosis 01/09/2017  . Restless legs 11/14/2016  . Osteoarthritis 11/14/2016  . 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 Attention deficit hyperactivity disorder 11/14/2016  . Anxiety 11/14/2016  . Adult attention deficit hyperactivity disorder 11/14/2016  . Osteoarthritis of right knee 10/03/2012  . Fibromyalgia syndrome 10/03/2012  . Obesity (BMI 30.0-34.9) 10/03/2012   Past Medical History:  Diagnosis Date  . ADHD (attention deficit hyperactivity disorder)   . Anxiety    "from the fibromyalgia"  . Chronic lower back pain   . Fibromyalgia   . Headache     "bad one; monthly" (01/11/2017)  . Osteoarthritis    "all over" (01/11/2017)  . Pneumonia    "I've had it about 3 times" (01/11/2017)  . PONV (postoperative nausea and vomiting)   . Rotator cuff tear    left shoulder    History reviewed. No pertinent family history.  Past Surgical History:  Procedure Laterality Date  . BACK SURGERY    . CARPAL TUNNEL RELEASE Bilateral   . CYST EXCISION     behind rt ear,coxxyx  . CYSTECTOMY N/A    from spine.   Marland Kitchen DILATION AND CURETTAGE OF UTERUS    . SHOULDER OPEN ROTATOR CUFF REPAIR Bilateral   . TONSILLECTOMY    . TOTAL KNEE ARTHROPLASTY Right 10/02/2012   Procedure: TOTAL KNEE ARTHROPLASTY;  Surgeon: Valeria Batman, MD;  Location: [facility] OR;  Service: Orthopedics;  Laterality: Right;  Right Total Knee Arthroplasty  . TRANSFORAMINAL LUMBAR INTERBODY FUSION (TLIF) WITH PEDICLE SCREW FIXATION 2 LEVEL Left 01/09/2017   L3-4, L4-5/notes 01/09/2017  . TUBAL LIGATION    . VAGINAL HYSTERECTOMY     Social History   Occupational History  . Not on file  Tobacco Use  . Smoking status: Never Smoker  . Smokeless tobacco: Never Used  Substance and Sexual Activity  . Alcohol use: No  . Drug use: No  . Sexual activity: Not Currently

## 2018-10-05 ENCOUNTER — Encounter (INDEPENDENT_AMBULATORY_CARE_PROVIDER_SITE_OTHER): Payer: Self-pay | Admitting: Specialist

## 2018-10-05 ENCOUNTER — Ambulatory Visit (INDEPENDENT_AMBULATORY_CARE_PROVIDER_SITE_OTHER): Payer: Medicare Other | Admitting: Specialist

## 2018-10-05 ENCOUNTER — Ambulatory Visit (INDEPENDENT_AMBULATORY_CARE_PROVIDER_SITE_OTHER): Payer: Medicare Other

## 2018-10-05 VITALS — BP 127/77 | HR 91 | Ht 65.5 in | Wt 214.0 lb

## 2018-10-05 DIAGNOSIS — M4326 Fusion of spine, lumbar region: Secondary | ICD-10-CM | POA: Diagnosis not present

## 2018-10-05 NOTE — Patient Instructions (Signed)
Avoid frequent bending and stooping  No lifting greater than 10 lbs. May use ice or moist heat for pain. Weight loss is of benefit. Best medication for lumbar disc disease is arthritis medications like motrin, celebrex and naprosyn. Exercise is important to improve your indurance and does allow people to function better inspite of back pain.   

## 2018-10-05 NOTE — Progress Notes (Signed)
Office Visit Note   Patient: Laura Solis           Date of Birth: 1950/10/22           MRN: [id number] Visit Date: 10/05/2018              Requested by: Joaquin Courts, DO [address] [place], [place] [address] PCP: Joaquin Courts, DO   Assessment & Plan: Visit Diagnoses:  1. Fusion of spine of lumbar region     Plan: Avoid frequent bending and stooping  No lifting greater than 10 lbs. May use ice or moist heat for pain. Weight loss is of benefit. Best medication for lumbar disc disease is arthritis medications like motrin, celebrex and naprosyn. Exercise is important to improve your indurance and does allow people to function better inspite of back pain.    Follow-Up Instructions: No follow-ups on file.   Orders:  Orders Placed This Encounter  Procedures  . XR Lumbar Spine 2-3 Views   No orders of the defined types were placed in this encounter.     Procedures: No procedures performed   Clinical Data: No additional findings.   Subjective: Chief Complaint  Patient presents with  . Lower Back - Follow-up    9 month post op visit    68 year old female post revision of L4-5 TLIf and arthrodesis L3 to S1 12/2017, taking occasional motrin. No complaints.    Review of Systems  Constitutional: Negative.   HENT: Negative.   Eyes: Negative.   Respiratory: Negative.   Cardiovascular: Negative.   Gastrointestinal: Negative.   Endocrine: Negative.   Genitourinary: Negative.   Musculoskeletal: Negative.   Skin: Negative.   Allergic/Immunologic: Negative.   Neurological: Negative.   Hematological: Negative.   Psychiatric/Behavioral: Negative.      Objective: Vital Signs: There were no vitals taken for this visit.  Physical Exam Constitutional:      Appearance: She is well-developed.  HENT:     Head: Normocephalic and atraumatic.  Eyes:     Pupils: Pupils are equal, round, and reactive to light.  Neck:     Musculoskeletal:  Normal range of motion and neck supple.  Pulmonary:     Effort: Pulmonary effort is normal.     Breath sounds: Normal breath sounds.  Abdominal:     General: Bowel sounds are normal.     Palpations: Abdomen is soft.  Skin:    General: Skin is warm and dry.  Neurological:     Mental Status: She is alert and oriented to person, place, and time.  Psychiatric:        Behavior: Behavior normal.        Thought Content: Thought content normal.        Judgment: Judgment normal.     Back Exam   Tenderness  The patient is experiencing tenderness in the lumbar.  Range of Motion  Extension: abnormal  Flexion: abnormal  Lateral bend right: abnormal  Lateral bend left: abnormal  Rotation right: abnormal  Rotation left: abnormal   Muscle Strength  Right Quadriceps:  5/5  Left Quadriceps:  5/5  Right Hamstrings:  5/5  Left Hamstrings:  5/5   Tests  Straight leg raise right: negative Straight leg raise left: negative  Reflexes  Patellar: normal Achilles: normal  Other  Toe walk: normal Heel walk: normal Sensation: normal Gait: normal       Specialty Comments:  No specialty comments available.  Imaging: No results found.  PMFS History: Patient Active Problem List   Diagnosis Date Noted  . Anemia associated with acute blood loss 01/17/2018    Priority: High    Class: Acute  . 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 Pseudarthrosis after fusion or arthrodesis 01/16/2018    Priority: High    Class: Chronic  . Chronic pain of left knee 04/11/2018  . Bilateral primary osteoarthritis of knee 04/11/2018  . S/P lumbar spinal fusion 01/16/2018  . Impingement syndrome of left shoulder 12/14/2017  . S/P lumbar fusion 01/26/2017  . Postoperative urinary retention 01/11/2017  . Lumbar stenosis 01/09/2017  . Restless legs 11/14/2016  . Osteoarthritis 11/14/2016  . 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  Attention deficit hyperactivity disorder 11/14/2016  . Anxiety 11/14/2016  . Adult attention deficit hyperactivity disorder 11/14/2016  . Osteoarthritis of right knee 10/03/2012  . Fibromyalgia syndrome 10/03/2012  . Obesity (BMI 30.0-34.9) 10/03/2012   Past Medical History:  Diagnosis Date  . ADHD (attention deficit hyperactivity disorder)   . Anxiety    "from the fibromyalgia"  . Chronic lower back pain   . Fibromyalgia   . Headache    "bad one; monthly" (01/11/2017)  . Osteoarthritis    "all over" (01/11/2017)  . Pneumonia    "I've had it about 3 times" (01/11/2017)  . PONV (postoperative nausea and vomiting)   . Rotator cuff tear    left shoulder    No family history on file.  Past Surgical History:  Procedure Laterality Date  . BACK SURGERY    . CARPAL TUNNEL RELEASE Bilateral   . CYST EXCISION     behind rt ear,coxxyx  . CYSTECTOMY N/A    from spine.   Marland Kitchen DILATION AND CURETTAGE OF UTERUS    . SHOULDER OPEN ROTATOR CUFF REPAIR Bilateral   . TONSILLECTOMY    . TOTAL KNEE ARTHROPLASTY Right 10/02/2012   Procedure: TOTAL KNEE ARTHROPLASTY;  Surgeon: Valeria Batman, MD;  Location: [facility];  Service: Orthopedics;  Laterality: Right;  Right Total Knee Arthroplasty  . TRANSFORAMINAL LUMBAR INTERBODY FUSION (TLIF) WITH PEDICLE SCREW FIXATION 2 LEVEL Left 01/09/2017   L3-4, L4-5/notes 01/09/2017  . TUBAL LIGATION    . VAGINAL HYSTERECTOMY     Social History   Occupational History  . Not on file  Tobacco Use  . Smoking status: Never Smoker  . Smokeless tobacco: Never Used  Substance and Sexual Activity  . Alcohol use: No  . Drug use: No  . Sexual activity: Not Currently

## 2019-04-10 ENCOUNTER — Encounter: Payer: Self-pay | Admitting: Specialist

## 2019-04-10 ENCOUNTER — Other Ambulatory Visit: Payer: Self-pay

## 2019-04-10 ENCOUNTER — Ambulatory Visit (INDEPENDENT_AMBULATORY_CARE_PROVIDER_SITE_OTHER): Payer: Medicare Other | Admitting: Specialist

## 2019-04-10 ENCOUNTER — Ambulatory Visit (INDEPENDENT_AMBULATORY_CARE_PROVIDER_SITE_OTHER): Payer: Medicare Other

## 2019-04-10 VITALS — BP 144/79 | HR 86 | Ht 65.5 in | Wt 214.0 lb

## 2019-04-10 DIAGNOSIS — M4326 Fusion of spine, lumbar region: Secondary | ICD-10-CM

## 2019-04-10 DIAGNOSIS — M1712 Unilateral primary osteoarthritis, left knee: Secondary | ICD-10-CM

## 2019-04-10 NOTE — Progress Notes (Addendum)
Post-Op Visit Note   Patient: Laura Solis           Date of Birth: [birth year]           MRN: [id number] Visit Date: 04/10/2019 PCP: [name], DO   Assessment & Plan:  Chief Complaint:  Chief Complaint  Patient presents with  . Lower Back - Follow-up   Visit Diagnoses:  1. Fusion of spine of lumbar region   2. Unilateral primary osteoarthritis, left knee   Lumbar incision is healed. Legs, motor is normal, sensation Lumbar radiographs show minimal motion at 36mm. Body mass index is 35.07 kg/m.   Plan: Avoid frequent bending and stooping  No lifting greater than 10 lbs. May use ice or moist heat for pain. Weight loss is of benefit. Best medication for lumbar disc disease is arthritis medications like motrin, celebrex and naprosyn. Exercise is important to improve your indurance and does allow people to function better inspite of back pain.    Follow-Up Instructions: Return in about 3 months (around 07/10/2019).   Orders:  Orders Placed This Encounter  Procedures  . XR Lumbar Spine 2-3 Views   No orders of the defined types were placed in this encounter.   Imaging: Xr Lumbar Spine 2-3 Views  Result Date: 04/10/2019 AP and lateral flexion and extension radiographs show the pedicle screws and rods fixing L3 to S1 in good postion and alignment. There is 3 mm of measurable motion from the upper anterior edge of the L3 vertebral body and the lowest pedicle screw anterior tip at L5. The lateral radiographs however are not exact in terms of the parallel screw space and this suggests lateral bending is occurring which impacts the measurments. No gross motion or loosening of hardware is noted.   PMFS History: Patient Active Problem List   Diagnosis Date Noted  . Anemia associated with acute blood loss 01/17/2018    Priority: High    Class: Acute  . 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 Pseudarthrosis after  fusion or arthrodesis 01/16/2018    Priority: High    Class: Chronic  . Chronic pain of left knee 04/11/2018  . Bilateral primary osteoarthritis of knee 04/11/2018  . S/P lumbar spinal fusion 01/16/2018  . Impingement syndrome of left shoulder 12/14/2017  . S/P lumbar fusion 01/26/2017  . Postoperative urinary retention 01/11/2017  . Lumbar stenosis 01/09/2017  . Restless legs 11/14/2016  . Osteoarthritis 11/14/2016  . 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 Attention deficit hyperactivity disorder 11/14/2016  . Anxiety 11/14/2016  . Adult attention deficit hyperactivity disorder 11/14/2016  . Osteoarthritis of right knee 10/03/2012  . Fibromyalgia syndrome 10/03/2012  . Obesity (BMI 30.0-34.9) 10/03/2012   Past Medical History:  Diagnosis Date  . ADHD (attention deficit hyperactivity disorder)   . Anxiety    "from the fibromyalgia"  . Chronic lower back pain   . Fibromyalgia   . Headache    "bad one; monthly" (01/11/2017)  . Osteoarthritis    "all over" (01/11/2017)  . Pneumonia    "I've had it about 3 times" (01/11/2017)  . PONV (postoperative nausea and vomiting)   . Rotator cuff tear    left shoulder    History reviewed. No pertinent family history.  Past Surgical History:  Procedure Laterality Date  . BACK SURGERY    . CARPAL TUNNEL RELEASE Bilateral   . CYST EXCISION  behind rt ear,coxxyx  . CYSTECTOMY N/A    from spine.   Marland Kitchen. DILATION AND CURETTAGE OF UTERUS    . SHOULDER OPEN ROTATOR CUFF REPAIR Bilateral   . TONSILLECTOMY    . TOTAL KNEE ARTHROPLASTY Right 10/02/2012   Procedure: TOTAL KNEE ARTHROPLASTY;  Surgeon: Valeria BatmanPeter W [name], MD;  [facility];  Service: Orthopedics;  Laterality: Right;  Right Total Knee Arthroplasty  . TRANSFORAMINAL LUMBAR INTERBODY FUSION (TLIF) WITH PEDICLE SCREW FIXATION 2 LEVEL Left 01/09/2017   L3-4, L4-5/notes 01/09/2017  . TUBAL LIGATION    . VAGINAL HYSTERECTOMY     Social History   Occupational History  .  Not on file  Tobacco Use  . Smoking status: Never Smoker  . Smokeless tobacco: Never Used  Substance and Sexual Activity  . Alcohol use: No  . Drug use: No  . Sexual activity: Not Currently

## 2019-04-10 NOTE — Patient Instructions (Signed)
Avoid frequent bending and stooping  No lifting greater than 10 lbs. May use ice or moist heat for pain. Weight loss is of benefit. Best medication for lumbar disc disease is arthritis medications like motrin, celebrex and naprosyn. Exercise is important to improve your indurance and does allow people to function better inspite of back pain.   

## 2019-04-18 ENCOUNTER — Telehealth: Payer: Self-pay | Admitting: Specialist

## 2019-04-18 NOTE — Telephone Encounter (Signed)
Patient called. Says she is in a lot of pain. Can not get out of bed. Would like to know if something could be called in for pain. Her call back number is [phone]

## 2019-04-18 NOTE — Telephone Encounter (Signed)
Please advise on medication

## 2019-04-18 NOTE — Telephone Encounter (Signed)
Can you ask Dr. Louanne Skye about this, this afternoon?

## 2019-04-18 NOTE — Telephone Encounter (Signed)
Patient called again to check on the status from her previous message.  She stated that she would like a call back today.  Thank you.

## 2019-04-19 ENCOUNTER — Other Ambulatory Visit: Payer: Self-pay | Admitting: Specialist

## 2019-04-19 ENCOUNTER — Telehealth: Payer: Self-pay

## 2019-04-19 MED ORDER — TRAMADOL-ACETAMINOPHEN 37.5-325 MG PO TABS: 1.0000 | ORAL_TABLET | Freq: Four times a day (QID) | ORAL | 0 refills | Status: DC | PRN

## 2019-04-19 NOTE — Telephone Encounter (Signed)
Patient called again this morning stating that she can't walk, or stand and is in a lot of pain.  Advised patient that a message was sent to Dr. Louanne Skye on Thursday, 04/18/2019 and that we were waiting on a response.  Cb# is [phone].  Please advise.  Thank you.

## 2019-04-19 NOTE — Telephone Encounter (Signed)
Please advise on pain medication. We discussed this patient yesterday afternoon. Thanks.

## 2019-04-19 NOTE — Telephone Encounter (Signed)
Ultracet sent to pharmacy, she is well beyond expected post op pain and was assymptomatic at last visit. No strong narcotic necessary.  jen

## 2019-04-19 NOTE — Telephone Encounter (Signed)
Talked with patient and advised her of Dr. Otho Ket message below.  Patient voiced that she understands.

## 2019-04-22 ENCOUNTER — Encounter: Payer: Self-pay | Admitting: Specialist

## 2019-04-22 ENCOUNTER — Ambulatory Visit: Payer: Self-pay

## 2019-04-22 ENCOUNTER — Ambulatory Visit (INDEPENDENT_AMBULATORY_CARE_PROVIDER_SITE_OTHER): Payer: Medicare Other | Admitting: Specialist

## 2019-04-22 VITALS — BP 142/86 | HR 75 | Ht 65.5 in | Wt 214.0 lb

## 2019-04-22 DIAGNOSIS — M533 Sacrococcygeal disorders, not elsewhere classified: Secondary | ICD-10-CM

## 2019-04-22 DIAGNOSIS — M4326 Fusion of spine, lumbar region: Secondary | ICD-10-CM | POA: Diagnosis not present

## 2019-04-22 MED ORDER — HYDROCODONE-ACETAMINOPHEN 5-325 MG PO TABS: 1.0000 | ORAL_TABLET | Freq: Four times a day (QID) | ORAL | 0 refills | Status: DC | PRN

## 2019-04-22 NOTE — Progress Notes (Signed)
Office Visit Note   Patient: Laura Solis           Date of Birth: 11/06/1950           MRN: [id number] Visit Date: 04/22/2019              Requested by: [name], [place] [address] PCP: [name], DO   Assessment & Plan: Visit Diagnoses:  1. Fusion of spine of lumbar region   2. Sacroiliac pain     Plan: Avoid frequent bending and stooping  No lifting greater than 10 lbs. May use ice or moist heat for pain. Weight loss is of benefit. Rest with a walker or cane right hand to decrease stress on the right SI joint. Ice to the right SI joint Exercise is important to improve your indurance and does allow people to function better inspite of back pain. If pain persists then consideration of an MRI or CT scan to assess the right SI joint where a localized fusion of the upper SI joint may have developed a stress related stress fracture.   Follow-Up Instructions: No follow-ups on file.   Orders:  Orders Placed This Encounter  Procedures  . XR Lumbar Spine 2-3 Views   No orders of the defined types were placed in this encounter.     Procedures: No procedures performed   Clinical Data: No additional findings.   Subjective: Chief Complaint  Patient presents with  . Lower Back - Pain    68 year old female post revision of an L4-5 fusion after retropulsion and loss of fixation. She has extension of the fusion to the S1 level with posterior fusion with rearthrodesis of the L4-5 level and placement of a second cage left L4-5. She has done well post surgery 12/2017.  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 No bowel or bladder difficulty. Pain with standing and walking with radiation into the  Sciatic nerve area right side. There twisting and transitioning to sitting and standing she has to rollover and roll out. This started a week after she was last seen  9/15. What ever it was it seems to be improving.    Review of Systems  Constitutional: Negative.   HENT: Negative.   Eyes: Negative.   Respiratory: Negative.   Cardiovascular: Negative.   Gastrointestinal: Negative.   Endocrine: Negative.   Genitourinary: Negative.   Musculoskeletal: Positive for back pain and gait problem.  Skin: Negative.   Allergic/Immunologic: Negative.   Neurological: Positive for weakness and numbness.  Hematological: Negative.   Psychiatric/Behavioral: Negative.      Objective: Vital Signs: BP (!) 142/86 (BP Location: Left Arm, Patient Position: Sitting)   Pulse 75   Ht 5' 5.5" (1.664 m)   Wt 214 lb (97.1 kg)   BMI 35.07 kg/m   Physical Exam  Ortho Exam  Specialty Comments:  No specialty comments available.  Imaging: No results found.   PMFS History: Patient Active Problem List   Diagnosis Date Noted  . Anemia associated with acute blood loss 01/17/2018    Priority: High    Class: Acute  . 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 Pseudarthrosis after fusion or arthrodesis 01/16/2018    Priority: High    Class: Chronic  . Chronic pain of left knee 04/11/2018  . Bilateral  primary osteoarthritis of knee 04/11/2018  . S/P lumbar spinal fusion 01/16/2018  . Impingement syndrome of left shoulder 12/14/2017  . S/P lumbar fusion 01/26/2017  . Postoperative urinary retention 01/11/2017  . Lumbar stenosis 01/09/2017  . Restless legs 11/14/2016  . Osteoarthritis 11/14/2016  . 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 Attention deficit hyperactivity disorder 11/14/2016  . Anxiety 11/14/2016  . Adult attention deficit hyperactivity disorder 11/14/2016  . Osteoarthritis of right knee 10/03/2012  . Fibromyalgia syndrome 10/03/2012  . Obesity (BMI 30.0-34.9) 10/03/2012   Past Medical History:  Diagnosis Date  . ADHD (attention deficit hyperactivity disorder)   . Anxiety    "from  the fibromyalgia"  . Chronic lower back pain   . Fibromyalgia   . Headache    "bad one; monthly" (01/11/2017)  . Osteoarthritis    "all over" (01/11/2017)  . Pneumonia    "I've had it about 3 times" (01/11/2017)  . PONV (postoperative nausea and vomiting)   . Rotator cuff tear    left shoulder    No family history on file.  Past Surgical History:  Procedure Laterality Date  . BACK SURGERY    . CARPAL TUNNEL RELEASE Bilateral   . CYST EXCISION     behind rt ear,coxxyx  . CYSTECTOMY N/A    from spine.   Marland Kitchen. DILATION AND CURETTAGE OF UTERUS    . SHOULDER OPEN ROTATOR CUFF REPAIR Bilateral   . TONSILLECTOMY    . TOTAL KNEE ARTHROPLASTY Right 10/02/2012   Procedure: TOTAL KNEE ARTHROPLASTY;  Surgeon: Valeria BatmanPeter W [name], MD;  [facility];  Service: Orthopedics;  Laterality: Right;  Right Total Knee Arthroplasty  . TRANSFORAMINAL LUMBAR INTERBODY FUSION (TLIF) WITH PEDICLE SCREW FIXATION 2 LEVEL Left 01/09/2017   L3-4, L4-5/notes 01/09/2017  . TUBAL LIGATION    . VAGINAL HYSTERECTOMY     Social History   Occupational History  . Not on file  Tobacco Use  . Smoking status: Never Smoker  . Smokeless tobacco: Never Used  Substance and Sexual Activity  . Alcohol use: No  . Drug use: No  . Sexual activity: Not Currently

## 2019-04-22 NOTE — Patient Instructions (Signed)
Avoid frequent bending and stooping  No lifting greater than 10 lbs. May use ice or moist heat for pain. Weight loss is of benefit. Rest with a walker or cane right hand to decrease stress on the right SI joint. Ice to the right SI joint Exercise is important to improve your indurance and does allow people to function better inspite of back pain. If pain persists then consideration of an MRI or CT scan to assess the right SI joint where a localized fusion of the upper SI joint may have developed a stress related stress fracture.

## 2019-04-25 ENCOUNTER — Encounter: Payer: Self-pay | Admitting: Radiology

## 2019-04-25 ENCOUNTER — Telehealth: Payer: Self-pay | Admitting: Specialist

## 2019-04-25 NOTE — Telephone Encounter (Signed)
I Faxed not to Attn: Verdis Frederickson

## 2019-04-25 NOTE — Telephone Encounter (Signed)
Laura Solis from [employer] [facility] [employer] needs a note faxed to her stating that the patient will be out of work until her f/u appointment on October 5th.  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  The fax number is [phone].  CB#[phone].  Thank you.

## 2019-05-06 ENCOUNTER — Other Ambulatory Visit: Payer: Self-pay

## 2019-05-06 ENCOUNTER — Encounter: Payer: Self-pay | Admitting: Specialist

## 2019-05-06 ENCOUNTER — Ambulatory Visit (INDEPENDENT_AMBULATORY_CARE_PROVIDER_SITE_OTHER): Payer: Medicare Other | Admitting: Specialist

## 2019-05-06 VITALS — BP 122/76 | HR 76 | Ht 65.5 in | Wt 214.0 lb

## 2019-05-06 DIAGNOSIS — M533 Sacrococcygeal disorders, not elsewhere classified: Secondary | ICD-10-CM | POA: Diagnosis not present

## 2019-05-06 DIAGNOSIS — G8929 Other chronic pain: Secondary | ICD-10-CM

## 2019-05-06 DIAGNOSIS — M4326 Fusion of spine, lumbar region: Secondary | ICD-10-CM

## 2019-05-06 NOTE — Progress Notes (Signed)
Office Visit Note   Patient: Laura Solis           Date of Birth: 11-30-1950           MRN: [id number] Visit Date: 05/06/2019              Requested by: Laura Solis, [address] [place] [address] PCP: Laura Code, DO   Assessment & Plan: Visit Diagnoses:  1. Chronic right SI joint pain   2. Fusion of spine of lumbar region     Plan: Today I asked Dr. Junius Solis if he could perform an ultrasound-guided diagnostic/therapeutic right SI joint Marcaine/Depo-Medrol injection.  I advised patient to pay close attention to how she feels immediately after and over the next several days.  She will call me on Friday to leave a message and let me know how she is feeling.  If she has excellent relief we will discuss with Dr. Louanne Solis and see if it is okay for her to return back to work next week.  If she continues to be symptomatic we will make decision at that time as to whether or not MRI or CT scan is indicated as he previously mentioned in his last office note.  Follow-Up Instructions: Return in about 1 week (around 05/13/2019) for with Laura Solis.   Orders:  No orders of the defined types were placed in this encounter.  No orders of the defined types were placed in this encounter.     Procedures: No procedures performed   Clinical Data: No additional findings.   Subjective: Chief Complaint  Patient presents with  . Lower Back - Follow-up    HPI 68 year old white female comes in today for recheck of right-sided low back pain.  Again patient is localizing her pain to around the right SI joint.  Denies lower extremity radiculopathy.  No pain on the left side.  Pain when she is ambulating, sitting.  She has not had SI joint injection.  She is wanting to return back to work when school starts next week. Review of Systems No current cardiac, pulm, gi, gu issues Objective: Vital Signs: BP 122/76 (BP Location: Left Arm, Patient Position: Sitting)   Pulse 76    Ht 5' 5.5" (1.664 m)   Wt 214 lb (97.1 kg)   BMI 35.07 kg/m   Physical Exam Constitutional:      Appearance: Normal appearance.  HENT:     Head: Normocephalic and atraumatic.  Eyes:     Extraocular Movements: Extraocular movements intact.     Pupils: Pupils are equal, round, and reactive to light.  Pulmonary:     Effort: No respiratory distress.  Musculoskeletal:     Comments: Patient ambulating well.  She has moderate tenderness over the right SI joint.  Nontender over the left side.  Neurovas intact.  No focal motor deficits.  Skin:    General: Skin is warm and dry.  Neurological:     General: No focal deficit present.     Mental Status: She is alert and oriented to person, place, and time.  Psychiatric:        Mood and Affect: Mood normal.     Ortho Exam  Specialty Comments:  No specialty comments available.  Imaging: No results found.   PMFS History: Patient Active Problem List   Diagnosis Date Noted  . Chronic pain of left knee 04/11/2018  . Bilateral primary osteoarthritis of knee 04/11/2018  . Anemia associated with acute blood  loss 01/17/2018    Class: Acute  . 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 Pseudarthrosis after fusion or arthrodesis 01/16/2018    Class: Chronic  . S/P lumbar spinal fusion 01/16/2018  . Impingement syndrome of left shoulder 12/14/2017  . S/P lumbar fusion 01/26/2017  . Postoperative urinary retention 01/11/2017  . Lumbar stenosis 01/09/2017  . Restless legs 11/14/2016  . Osteoarthritis 11/14/2016  . 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 Attention deficit hyperactivity disorder 11/14/2016  . Anxiety 11/14/2016  . Adult attention deficit hyperactivity disorder 11/14/2016  . Osteoarthritis of right knee 10/03/2012  . Fibromyalgia syndrome 10/03/2012  . Obesity (BMI 30.0-34.9) 10/03/2012   Past Medical History:  Diagnosis Date  . ADHD (attention deficit hyperactivity disorder)    . Anxiety    "from the fibromyalgia"  . Chronic lower back pain   . Fibromyalgia   . Headache    "bad one; monthly" (01/11/2017)  . Osteoarthritis    "all over" (01/11/2017)  . Pneumonia    "I've had it about 3 times" (01/11/2017)  . PONV (postoperative nausea and vomiting)   . Rotator cuff tear    left shoulder    History reviewed. No pertinent family history.  Past Surgical History:  Procedure Laterality Date  . BACK SURGERY    . CARPAL TUNNEL RELEASE Bilateral   . CYST EXCISION     behind rt ear,coxxyx  . CYSTECTOMY N/A    from spine.   Marland Kitchen DILATION AND CURETTAGE OF UTERUS    . SHOULDER OPEN ROTATOR CUFF REPAIR Bilateral   . TONSILLECTOMY    . TOTAL KNEE ARTHROPLASTY Right 10/02/2012   Procedure: TOTAL KNEE ARTHROPLASTY;  Surgeon: Valeria Batman, MD;  Location: [facility] OR;  Service: Orthopedics;  Laterality: Right;  Right Total Knee Arthroplasty  . TRANSFORAMINAL LUMBAR INTERBODY FUSION (TLIF) WITH PEDICLE SCREW FIXATION 2 LEVEL Left 01/09/2017   L3-4, L4-5/notes 01/09/2017  . TUBAL LIGATION    . VAGINAL HYSTERECTOMY     Social History   Occupational History  . Not on file  Tobacco Use  . Smoking status: Never Smoker  . Smokeless tobacco: Never Used  Substance and Sexual Activity  . Alcohol use: No  . Drug use: No  . Sexual activity: Not Currently

## 2019-05-06 NOTE — Progress Notes (Signed)
Subjective: She is here for ultrasound-guided right SI joint injection.  Chronic intermittent pain in this area.  Objective: Tender near the right SI joint and a little bit lateral to it.  Procedure: Ultrasound-guided right SI joint injection: After sterile prep with Betadine, injected 5 cc 1% lidocaine without epinephrine and 40 mg of methylprednisolone using a 22-gauge spinal needle.  She had modest improvement in pain during the immediate anesthetic phase.  She will follow-up as directed.

## 2019-05-08 ENCOUNTER — Encounter: Payer: Self-pay | Admitting: Orthopaedic Surgery

## 2019-05-08 ENCOUNTER — Ambulatory Visit (INDEPENDENT_AMBULATORY_CARE_PROVIDER_SITE_OTHER): Payer: Medicare Other | Admitting: Orthopaedic Surgery

## 2019-05-08 VITALS — BP 144/81 | HR 92 | Ht 65.0 in | Wt 214.0 lb

## 2019-05-08 DIAGNOSIS — M1712 Unilateral primary osteoarthritis, left knee: Secondary | ICD-10-CM | POA: Diagnosis not present

## 2019-05-08 DIAGNOSIS — M170 Bilateral primary osteoarthritis of knee: Secondary | ICD-10-CM

## 2019-05-08 DIAGNOSIS — M1711 Unilateral primary osteoarthritis, right knee: Secondary | ICD-10-CM

## 2019-05-08 MED ORDER — LIDOCAINE HCL 1 % IJ SOLN
2.0000 mL | INTRAMUSCULAR | Status: AC | PRN
  Administered 2019-05-08: 2 mL

## 2019-05-08 MED ORDER — BUPIVACAINE HCL 0.5 % IJ SOLN
2.0000 mL | INTRAMUSCULAR | Status: AC | PRN
  Administered 2019-05-08: 2 mL via INTRA_ARTICULAR

## 2019-05-08 MED ORDER — METHYLPREDNISOLONE ACETATE 40 MG/ML IJ SUSP
80.0000 mg | INTRAMUSCULAR | Status: AC | PRN
  Administered 2019-05-08: 80 mg via INTRA_ARTICULAR

## 2019-05-08 NOTE — Progress Notes (Signed)
Office Visit Note   Patient: Laura Solis           Date of Birth: [birth year]           MRN: [id number] Visit Date: 05/08/2019              Requested by: [name], [place] [address] PCP: [name], DO   Assessment & Plan: Visit Diagnoses:  1. Bilateral primary osteoarthritis of knee     Plan: Prior successful right total knee replacement without problem.  Has advanced osteoarthritis of the left knee with reasonable pain relief with cortisone injections.  Will reinject the left knee with cortisone today and plan to see her back as needed.  Follow-Up Instructions: Return if symptoms worsen or fail to improve.   Orders:  Orders Placed This Encounter  Procedures  . Large Joint Inj: L knee   No orders of the defined types were placed in this encounter.     Procedures: Large Joint Inj: L knee on 05/08/2019 1:15 PM Indications: pain and diagnostic evaluation Details: 25 G 1.5 in needle, anteromedial approach  Arthrogram: No  Medications: 2 mL lidocaine 1 %; 2 mL bupivacaine 0.5 %; 80 mg methylPREDNISolone acetate 40 MG/ML Procedure, treatment alternatives, risks and benefits explained, specific risks discussed. Consent was given by the patient. Patient was prepped and draped in the usual sterile fashion.       Clinical Data: No additional findings.   Subjective: Chief Complaint  Patient presents with  . Left Knee - Follow-up  Patient presents today for recurrent left knee pain. She received her last cortisone injection in February of 2020. She is wanting to get another injection today.  No problems with prior right total knee replacement  HPI  Review of Systems   Objective: Vital Signs: BP (!) 144/81   Pulse 92   Ht 5\' 5"  (1.651 m)   Wt 214 lb (97.1 kg)   BMI 35.61 kg/m   Physical Exam Constitutional:      Appearance: She is well-developed.  Eyes:     Pupils: Pupils are equal, round, and reactive to  light.  Pulmonary:     Effort: Pulmonary effort is normal.  Skin:    General: Skin is warm and dry.  Neurological:     Mental Status: She is alert and oriented to person, place, and time.  Psychiatric:        Behavior: Behavior normal.     Ortho Exam right total knee replacement without effusion or instability.  Full extension no distal edema neurologically intact.  Left knee with predominant medial joint pain today.  Some patellar crepitation.  Lacks just a few degrees to full extension and flexed over 100 degrees without instability.  Specialty Comments:  No specialty comments available.  Imaging: No results found.   PMFS History: Patient Active Problem List   Diagnosis Date Noted  . Chronic pain of left knee 04/11/2018  . Bilateral primary osteoarthritis of knee 04/11/2018  . Anemia associated with acute blood loss 01/17/2018    Class: Acute  . 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 Pseudarthrosis after fusion or arthrodesis 01/16/2018    Class: Chronic  . S/P lumbar spinal fusion 01/16/2018  . Impingement syndrome of left shoulder 12/14/2017  . S/P lumbar fusion 01/26/2017  . Postoperative urinary retention 01/11/2017  . Lumbar stenosis 01/09/2017  . Restless legs 11/14/2016  . Osteoarthritis 11/14/2016  .  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 Attention deficit hyperactivity disorder 11/14/2016  . Anxiety 11/14/2016  . Adult attention deficit hyperactivity disorder 11/14/2016  . Osteoarthritis of right knee 10/03/2012  . Fibromyalgia syndrome 10/03/2012  . Obesity (BMI 30.0-34.9) 10/03/2012   Past Medical History:  Diagnosis Date  . ADHD (attention deficit hyperactivity disorder)   . Anxiety    "from the fibromyalgia"  . Chronic lower back pain   . Fibromyalgia   . Headache    "bad one; monthly" (01/11/2017)  . Osteoarthritis    "all over" (01/11/2017)  . Pneumonia    "I've had it about 3 times" (01/11/2017)  .  PONV (postoperative nausea and vomiting)   . Rotator cuff tear    left shoulder    History reviewed. No pertinent family history.  Past Surgical History:  Procedure Laterality Date  . BACK SURGERY    . CARPAL TUNNEL RELEASE Bilateral   . CYST EXCISION     behind rt ear,coxxyx  . CYSTECTOMY N/A    from spine.   Marland Kitchen DILATION AND CURETTAGE OF UTERUS    . SHOULDER OPEN ROTATOR CUFF REPAIR Bilateral   . TONSILLECTOMY    . TOTAL KNEE ARTHROPLASTY Right 10/02/2012   Procedure: TOTAL KNEE ARTHROPLASTY;  Surgeon: Valeria Batman, MD;  Location: [facility];  Service: Orthopedics;  Laterality: Right;  Right Total Knee Arthroplasty  . TRANSFORAMINAL LUMBAR INTERBODY FUSION (TLIF) WITH PEDICLE SCREW FIXATION 2 LEVEL Left 01/09/2017   L3-4, L4-5/notes 01/09/2017  . TUBAL LIGATION    . VAGINAL HYSTERECTOMY     Social History   Occupational History  . Not on file  Tobacco Use  . Smoking status: Never Smoker  . Smokeless tobacco: Never Used  Substance and Sexual Activity  . Alcohol use: No  . Drug use: No  . Sexual activity: Not Currently

## 2019-05-10 ENCOUNTER — Telehealth: Payer: Self-pay | Admitting: Specialist

## 2019-05-10 NOTE — Telephone Encounter (Signed)
Patient called stating that the injection in her back helped.  She stated that you were going to fax a RTW note to her employer if the injection worked.  Fax#[phone].  CB#[phone].  Thank you.

## 2019-05-10 NOTE — Telephone Encounter (Signed)
Laura Solis, can you please advise, she had an SI joint injection with Dr. Junius Roads

## 2019-05-13 ENCOUNTER — Encounter: Payer: Self-pay | Admitting: Radiology

## 2019-05-13 NOTE — Telephone Encounter (Signed)
Dr. Louanne Skye said it was ok for her to return to work.---I called and advised patient and I will fax the note to her employer.

## 2019-05-13 NOTE — Telephone Encounter (Signed)
Need to discuss with Dr. Louanne Skye this afternoon

## 2019-05-13 NOTE — Telephone Encounter (Signed)
Note has been faxed.

## 2019-06-27 IMAGING — XA DG MYELOGRAPHY LUMBAR INJ LUMBOSACRAL
12 of 17 series · 12 of 17 positions shown · non-contrast
Comparison: CT of the lumbar spine 01/11/2017. MRI of the lumbar
spine 11/08/2016.

CLINICAL DATA: Recurrent low back pain extending into the right
lateral thigh to the knee.
TECHNIQUE: Contiguous axial images were obtained through the Lumbar spine after
the intrathecal infusion of infusion. Coronal and sagittal
reconstructions were obtained of the axial image sets.

[Series 1: vasc adipose · 1 of 1 slices shown (1 of 11)]
[im 1/1]
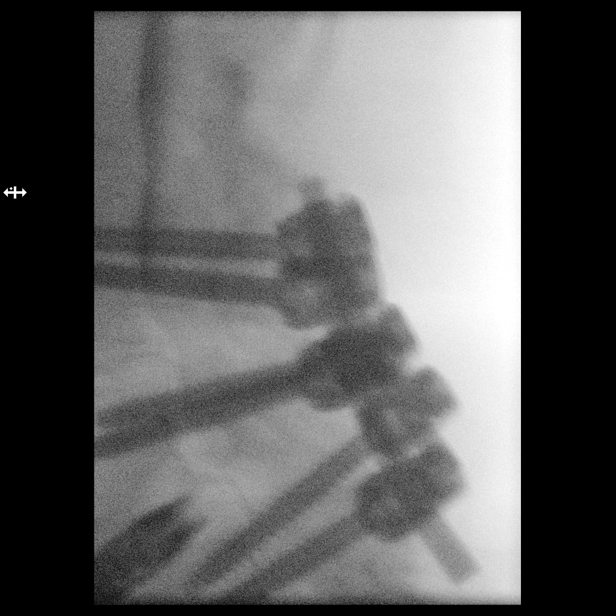

[Series 2: vasc adipose · 1 of 1 slices shown (2 of 11)]
[im 1/1]
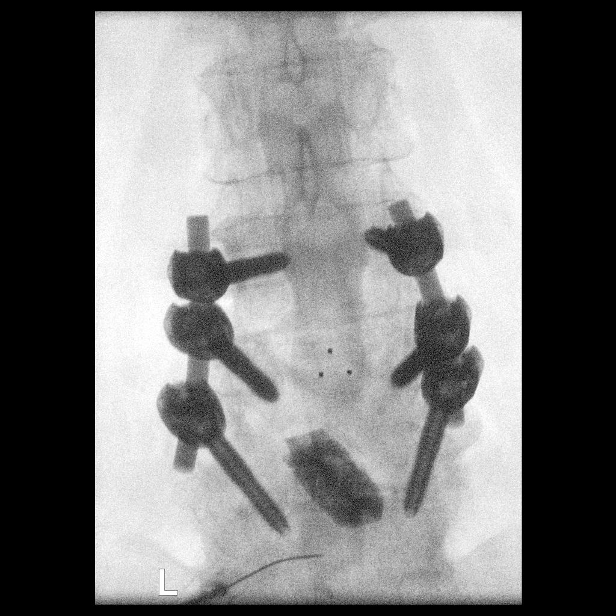

[Series 2: w lumbar spine flexion · 0.15mm/px · 1 of 1 slices shown]
[im 1/1]
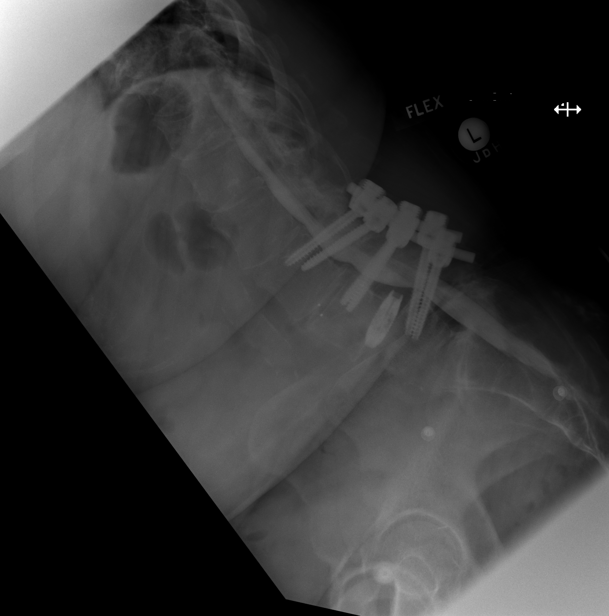

[Series 3: vasc adipose · 1 of 1 slices shown (3 of 11)]
[im 1/1]
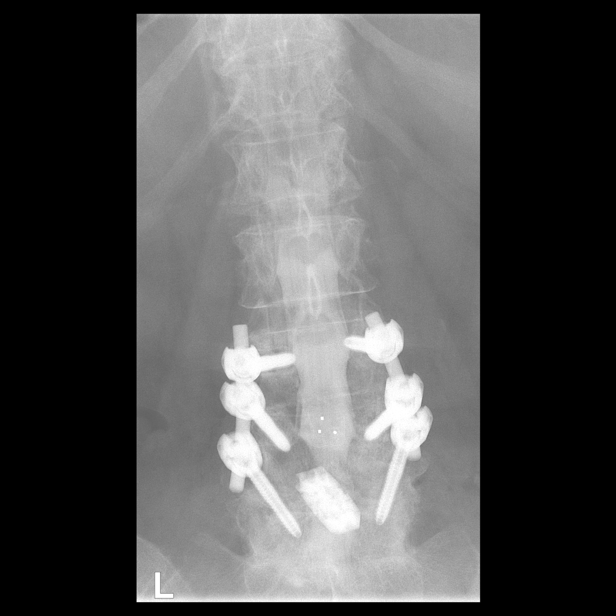

[Series 4: vasc adipose · 1 of 1 slices shown (4 of 11)]
[im 1/1]
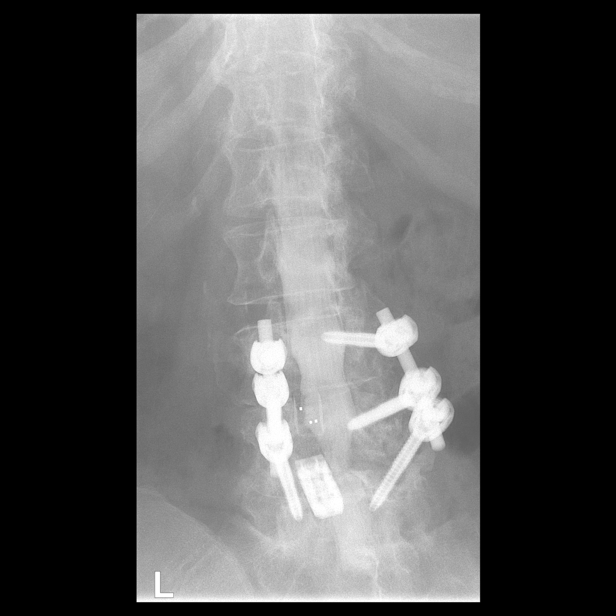

[Series 5: vasc adipose · 1 of 1 slices shown (5 of 11)]
[im 1/1]
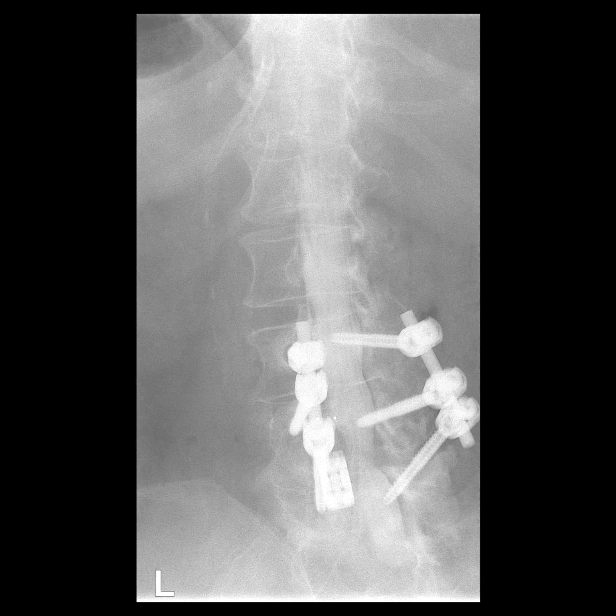

[Series 7: vasc adipose · 1 of 1 slices shown (6 of 11)]
[im 1/1]
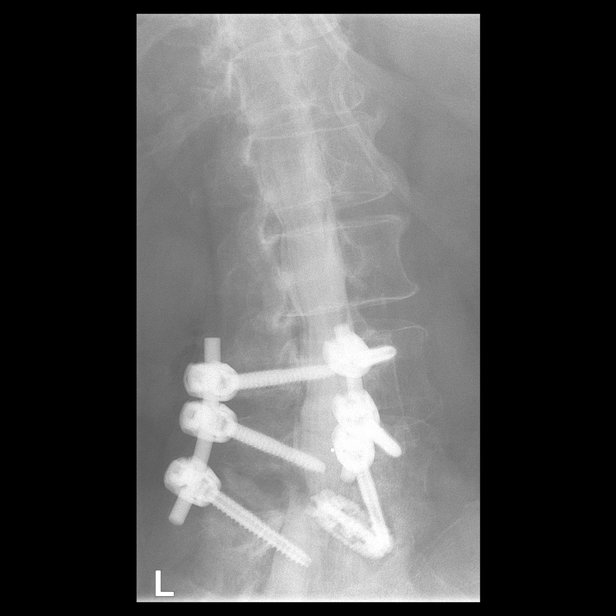

[Series 8: vasc adipose · 1 of 1 slices shown (7 of 11)]
[im 1/1]
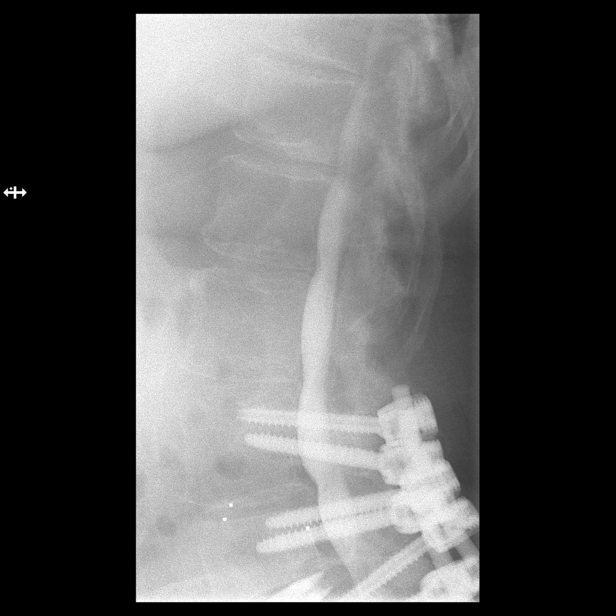

[Series 10: vasc adipose · 1 of 1 slices shown (8 of 11)]
[im 1/1]
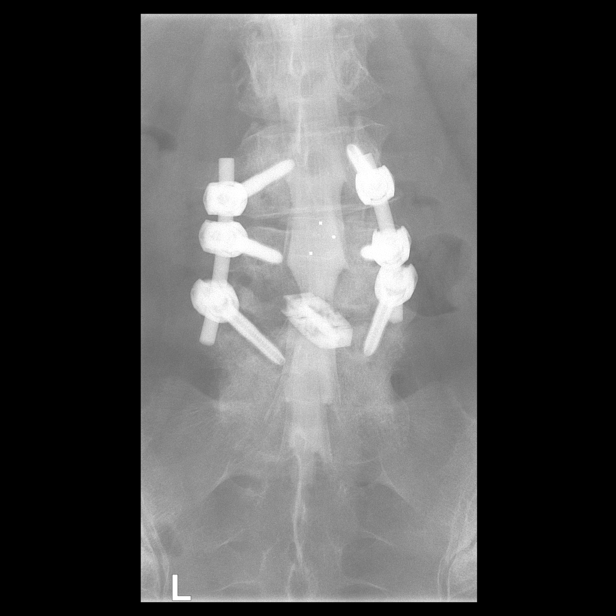

[Series 11: vasc adipose · 1 of 1 slices shown (9 of 11)]
[im 1/1]
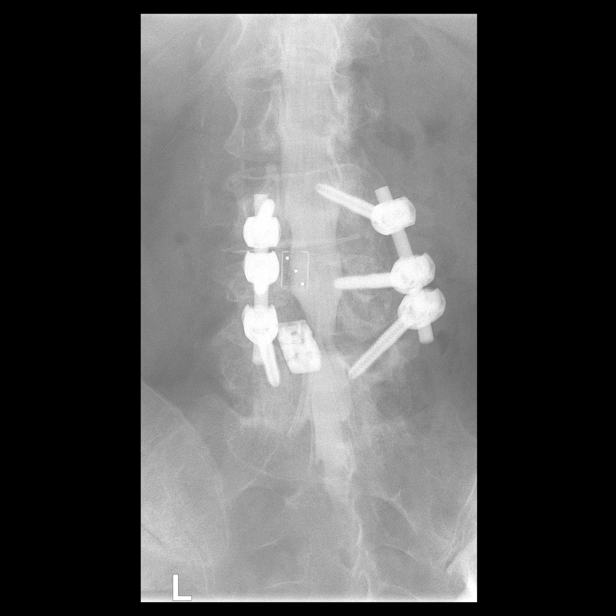

[Series 12: vasc adipose · 1 of 1 slices shown (10 of 11)]
[im 1/1]
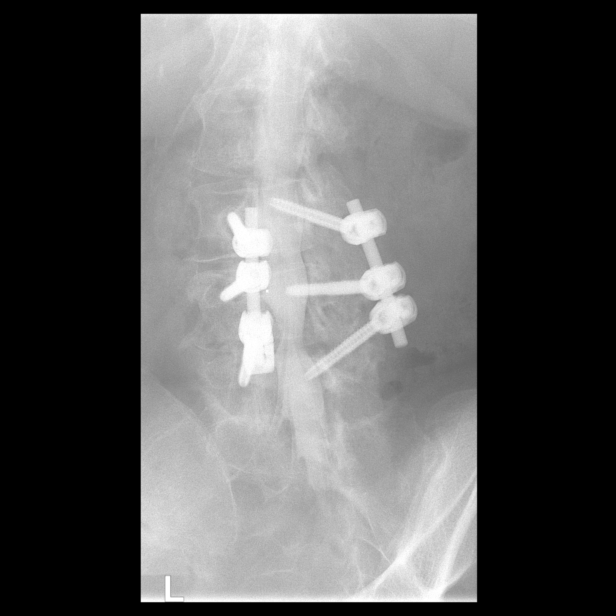

[Series 14: vasc adipose · 1 of 1 slices shown (11 of 11)]
[im 1/1]
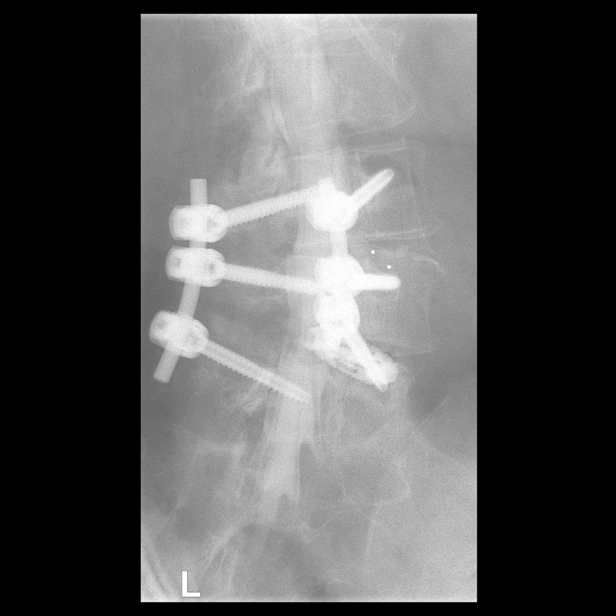

[12 of 17 positions shown; findings below may reference images not displayed]

EXAM:
LUMBAR MYELOGRAM

FLUOROSCOPY TIME:  Radiation Exposure Index (as provided by the
fluoroscopic device): 366.35 uGy*m2

If the device does not provide the exposure index:

Fluoroscopy Time:  36 seconds

Number of Acquired Images:  15

PROCEDURE:
After thorough discussion of risks and benefits of the procedure
including bleeding, infection, injury to nerves, blood vessels,
adjacent structures as well as headache and CSF leak, written and
oral informed consent was obtained. Consent was obtained by Dr.
Eugenia Glez. Time out form was completed.

Patient was positioned prone on the fluoroscopy table. Local
anesthesia was provided with 1% lidocaine without epinephrine after
prepped and draped in the usual sterile fashion. Puncture was
performed at L5-S1 using a 3 1/2 inch 22-gauge spinal needle via
left paramedian approach. Using a single pass through the dura, the
needle was placed within the thecal sac, with return of clear CSF.
15 mL of Isovue X-ZTT was injected into the thecal sac, with normal
opacification of the nerve roots and cauda equina consistent with
free flow within the subarachnoid space.

I personally performed the lumbar puncture and administered the
intrathecal contrast. I also personally supervised acquisition of
the myelogram images.
FINDINGS: LUMBAR MYELOGRAM FINDINGS:

Posterior pedicle screw and rod fixation is present at L3-4 and
L4-5. There is lucency surrounding the pedicle screws at L5
bilaterally. A metallic disc spacer is present at L4-5. Previously
noted graft material at L4-5 was replaced.

Prone images demonstrate slight anterolisthesis at L4-5. The disc
spacer aligns with the posterior wall of the L4 vertebral body.
There is moderate central canal stenosis at L4-5 despite surgery.
Subarticular narrowing is present bilaterally.

Anterolisthesis is significantly increased with standing grade 2
anterolisthesis measures 18 mm on the upright image. The metallic
disc spacer is exposed and extends 8 mm posterior to the posterior
wall of L4. There is flexion at L4-5. Anterolisthesis at L4-5 is
slightly reduced in extension. Anterolisthesis is also noted at L3-4
with standing. This does not change significantly with flexion or
extension. Alignment at L2-3 and above is stable.

CT LUMBAR MYELOGRAM FINDINGS:

Lumbar spine is imaged from T10-11 through S2-3. Rightward curvature
is centered at L3. Grade 1 anterolisthesis at L3-4 has progressed
since the prior exam. Graft material is fused to the superior
endplate of L4 with significant subsidence. There is no fusion to
the inferior endplate of L3.

The replaced disc spacer is noted at L4-5. The spacer lines with the
posterior wall of L4 in the supine position. Slight anterolisthesis
is present. AP alignment is otherwise anatomic.

Atherosclerotic calcifications are present in the aorta. There is no
aneurysm. Limited imaging of the abdomen is otherwise unremarkable.

T12-L1: A left paramedian disc protrusion effaces the ventral CSF.
The foramina are patent bilaterally.

L1-2: A shallow leftward disc bulge is present. Moderate facet
hypertrophy is noted bilaterally. There is no significant stenosis.

L2-3: Moderate facet hypertrophy is noted bilaterally. Mild left
subarticular narrowing is present. Mild left foraminal stenosis is
noted.

L3-4: Incomplete fusion is again seen. A wide left laminectomy is
present. There is some clumping of the nerve roots posteriorly.
Endplate spurs contribute to moderate left osseous foraminal
narrowing. Mild right foraminal narrowing is present with residual
disc material.

L4-5: A wide left laminectomy is present. Endplate spurring is again
noted bilaterally. Mild osseous foraminal narrowing is present
bilaterally. Left laminectomy and foraminotomy is noted.

L5-S1: Mild facet hypertrophy is present bilaterally. No significant
stenosis is evident. Posterior elements are fused bilaterally. There
is fusion across the disc space.
IMPRESSION: 1. Nonunion at L4-5 with marked lucency surrounding the L5 pedicle
screws bilaterally and dynamic grade 2 anterolisthesis evident with
standing. Only minimal anterolisthesis is present in the supine or
prone positions. The anterolisthesis results in significant
uncovering of the disc spacer into the spinal canal.
2. Moderate residual central canal stenosis at L4-5 with some
clumping of the nerve roots suggesting arachnoiditis as well.
3. Mild osseous foraminal narrowing bilaterally despite left
foraminotomy.
4. Nonunion at L3-4. Graft material is fused to the superior
endplate of L4 with subsidence, unchanged.
5. Moderate osseous left foraminal narrowing at L3-4 is stable.
6. Mild right foraminal narrowing at L3-4 is stable.
7. Mild left subarticular and foraminal stenosis at L2-3.

## 2019-07-01 ENCOUNTER — Telehealth: Payer: Self-pay | Admitting: Specialist

## 2019-07-01 NOTE — Telephone Encounter (Signed)
Patient lmom requesting a call back from Dr Louanne Skye or his assistant. Did not states reason for call

## 2019-07-02 NOTE — Telephone Encounter (Signed)
I called and patient states that her dates of being out of work were 04/17/2019 to 05/13/2019.   Put these on the forms and faxed back to [facility]

## 2019-07-12 ENCOUNTER — Ambulatory Visit: Payer: Medicare Other | Admitting: Specialist

## 2019-07-24 ENCOUNTER — Ambulatory Visit (INDEPENDENT_AMBULATORY_CARE_PROVIDER_SITE_OTHER): Payer: Medicare Other

## 2019-07-24 ENCOUNTER — Encounter: Payer: Self-pay | Admitting: Specialist

## 2019-07-24 ENCOUNTER — Other Ambulatory Visit: Payer: Self-pay

## 2019-07-24 ENCOUNTER — Ambulatory Visit (INDEPENDENT_AMBULATORY_CARE_PROVIDER_SITE_OTHER): Payer: Medicare Other | Admitting: Specialist

## 2019-07-24 VITALS — BP 155/97 | HR 77 | Ht 65.0 in | Wt 214.0 lb

## 2019-07-24 DIAGNOSIS — M4326 Fusion of spine, lumbar region: Secondary | ICD-10-CM

## 2019-07-24 DIAGNOSIS — M1712 Unilateral primary osteoarthritis, left knee: Secondary | ICD-10-CM

## 2019-07-24 DIAGNOSIS — M47816 Spondylosis without myelopathy or radiculopathy, lumbar region: Secondary | ICD-10-CM | POA: Diagnosis not present

## 2019-07-24 MED ORDER — METHYLPREDNISOLONE ACETATE 40 MG/ML IJ SUSP
40.0000 mg | INTRAMUSCULAR | Status: AC | PRN
  Administered 2019-07-24: 12:00:00 40 mg via INTRA_ARTICULAR

## 2019-07-24 MED ORDER — BUPIVACAINE HCL 0.25 % IJ SOLN
4.0000 mL | INTRAMUSCULAR | Status: AC | PRN
  Administered 2019-07-24: 12:00:00 4 mL via INTRA_ARTICULAR

## 2019-07-24 NOTE — Patient Instructions (Signed)
Avoid bending, stooping and avoid lifting weights greater than 10 lbs. Avoid prolong standing and walking. Avoid frequent bending and stooping  No lifting greater than 10 lbs. May use ice or moist heat for pain. Weight loss is of benefit. Handicap license is approved. Dr. Romona Curls secretary/Assistant will call to arrange for marcaine/steroid injection of the facet joints at the level just above the lumbar fusion. It this helps and the pain recurrs a consideration would be for a RFA of the joint nerves to stop the joint pain for  A year or two.

## 2019-07-24 NOTE — Progress Notes (Signed)
Office Visit Note   Patient: Laura Solis           Date of Birth: 1951/06/19           MRN: [id number] Visit Date: 07/24/2019              Requested by: [name], [place] [address] PCP: [name], DO   Assessment & Plan: Visit Diagnoses:  1. Fusion of spine of lumbar region   2. Spondylosis without myelopathy or radiculopathy, lumbar region   3. Unilateral primary osteoarthritis, left knee     Plan: Avoid bending, stooping and avoid lifting weights greater than 10 lbs. Avoid prolong standing and walking. Avoid frequent bending and stooping  No lifting greater than 10 lbs. May use ice or moist heat for pain. Weight loss is of benefit. Handicap license is approved. Dr. Romona Curls secretary/Assistant will call to arrange for marcaine/steroid injection of the facet joints at the level just above the lumbar fusion. It this helps and the pain recurrs a consideration would be for a RFA of the joint nerves to stop the joint pain for  A year or two.   Follow-Up Instructions: Return in about 3 weeks (around 08/14/2019).   Orders:  Orders Placed This Encounter  Procedures  . Large Joint Inj: L knee  . XR Lumbar Spine 2-3 Views   No orders of the defined types were placed in this encounter.     Procedures: Large Joint Inj: L knee on 07/24/2019 11:51 AM Indications: pain Details: 25 G 1.5 in needle, anterolateral approach  Arthrogram: No  Medications: 40 mg methylPREDNISolone acetate 40 MG/ML; 4 mL bupivacaine 0.25 % Outcome: tolerated well, no immediate complications Procedure, treatment alternatives, risks and benefits explained, specific risks discussed. Consent was given by the patient. Immediately prior to procedure a time out was called to verify the correct patient, procedure, equipment, support staff and site/side marked as required. Patient was prepped and draped in the usual sterile fashion.       Clinical  Data: No additional findings.   Subjective: Chief Complaint  Patient presents with  . Lower Back - Follow-up    68 year old female now 18 months post extension of fusion to L3 to S1 for loosening of hardware post TLIF L4-5. She is experiencing pain returning to upright after bending. Pain is sharp and gets some better after up a while. At night she rolls side to side. She avoids lifting and bending. No bowel or bladder difficulties. Walking is with left knee pain.    Review of Systems  Constitutional: Positive for unexpected weight change. Negative for activity change, appetite change, chills, diaphoresis, fatigue and fever.  HENT: Negative.  Negative for congestion, dental problem, drooling, ear discharge, ear pain, facial swelling, hearing loss, mouth sores, nosebleeds, postnasal drip, rhinorrhea, sinus pressure, sinus pain, sneezing, sore throat, tinnitus, trouble swallowing and voice change.   Eyes: Negative.   Respiratory: Negative.   Cardiovascular: Negative.   Gastrointestinal: Negative.   Endocrine: Negative.   Genitourinary: Negative.   Musculoskeletal: Positive for back pain, gait problem and joint swelling. Negative for arthralgias, myalgias, neck pain and neck stiffness.  Skin: Negative.   Allergic/Immunologic: Negative.  Negative for environmental allergies, food allergies and immunocompromised state.  Neurological: Negative for dizziness, tremors, seizures, syncope, facial asymmetry, speech difficulty, weakness, light-headedness, numbness and headaches.  Hematological: Negative.  Negative for adenopathy. Does not bruise/bleed easily.  Psychiatric/Behavioral: Negative.  Negative for agitation,  behavioral problems, confusion, decreased concentration, dysphoric mood, hallucinations, self-injury, sleep disturbance and suicidal ideas. The patient is not nervous/anxious and is not hyperactive.      Objective: Vital Signs: BP (!) 155/97 (BP Location: Left Arm, Patient Position:  Sitting)   Pulse 77   Ht 5\' 5"  (1.651 m)   Wt 214 lb (97.1 kg)   BMI 35.61 kg/m   Physical Exam Constitutional:      Appearance: She is well-developed.  HENT:     Head: Normocephalic and atraumatic.  Eyes:     Pupils: Pupils are equal, round, and reactive to light.  Pulmonary:     Effort: Pulmonary effort is normal.     Breath sounds: Normal breath sounds.  Abdominal:     General: Bowel sounds are normal.     Palpations: Abdomen is soft.  Musculoskeletal:     Cervical back: Normal range of motion and neck supple.     Lumbar back: Negative right straight leg raise test and negative left straight leg raise test.  Skin:    General: Skin is warm and dry.  Neurological:     Mental Status: She is alert and oriented to person, place, and time.  Psychiatric:        Behavior: Behavior normal.        Thought Content: Thought content normal.        Judgment: Judgment normal.     Back Exam   Tenderness  The patient is experiencing tenderness in the lumbar.  Range of Motion  Extension: abnormal  Flexion: normal  Lateral bend right: abnormal  Lateral bend left: abnormal  Rotation right: abnormal  Rotation left: abnormal   Muscle Strength  Right Quadriceps:  5/5  Left Quadriceps:  5/5  Right Hamstrings:  5/5  Left Hamstrings:  5/5   Tests  Straight leg raise right: negative Straight leg raise left: negative  Reflexes  Patellar: 0/4 Achilles: 0/4  Other  Toe walk: normal Heel walk: normal Sensation: normal Gait: normal  Erythema: no back redness Scars: present      Specialty Comments:  No specialty comments available.  Imaging: XR Lumbar Spine 2-3 Views  Result Date: 07/24/2019 AP and lateral radiograph of the lumbar spine show Lumbar fusion L3 to S1 with DDD L1-2 and L2-3. Spondylosis of the proximal adjacent level with the mPACT screw in close approximation.     PMFS History: Patient Active Problem List   Diagnosis Date Noted  . Anemia  associated with acute blood loss 01/17/2018    Priority: High    Class: Acute  . 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 Pseudarthrosis after fusion or arthrodesis 01/16/2018    Priority: High    Class: Chronic  . Chronic pain of left knee 04/11/2018  . Bilateral primary osteoarthritis of knee 04/11/2018  . S/P lumbar spinal fusion 01/16/2018  . Impingement syndrome of left shoulder 12/14/2017  . S/P lumbar fusion 01/26/2017  . Postoperative urinary retention 01/11/2017  . Lumbar stenosis 01/09/2017  . Restless legs 11/14/2016  . Osteoarthritis 11/14/2016  . 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 Attention deficit hyperactivity disorder 11/14/2016  . Anxiety 11/14/2016  . Adult attention deficit hyperactivity disorder 11/14/2016  . Osteoarthritis of right knee 10/03/2012  . Fibromyalgia syndrome 10/03/2012  . Obesity (BMI 30.0-34.9) 10/03/2012   Past Medical History:  Diagnosis Date  . ADHD (attention deficit hyperactivity disorder)   . Anxiety    "  from the fibromyalgia"  . Chronic lower back pain   . Fibromyalgia   . Headache    "bad one; monthly" (01/11/2017)  . Osteoarthritis    "all over" (01/11/2017)  . Pneumonia    "I've had it about 3 times" (01/11/2017)  . PONV (postoperative nausea and vomiting)   . Rotator cuff tear    left shoulder    History reviewed. No pertinent family history.  Past Surgical History:  Procedure Laterality Date  . BACK SURGERY    . CARPAL TUNNEL RELEASE Bilateral   . CYST EXCISION     behind rt ear,coxxyx  . CYSTECTOMY N/A    from spine.   Marland Kitchen. DILATION AND CURETTAGE OF UTERUS    . SHOULDER OPEN ROTATOR CUFF REPAIR Bilateral   . TONSILLECTOMY    . TOTAL KNEE ARTHROPLASTY Right 10/02/2012   Procedure: TOTAL KNEE ARTHROPLASTY;  Surgeon: Valeria BatmanPeter W [name], MD;  [facility];  Service: Orthopedics;  Laterality: Right;  Right Total Knee Arthroplasty  . TRANSFORAMINAL LUMBAR  INTERBODY FUSION (TLIF) WITH PEDICLE SCREW FIXATION 2 LEVEL Left 01/09/2017   L3-4, L4-5/notes 01/09/2017  . TUBAL LIGATION    . VAGINAL HYSTERECTOMY     Social History   Occupational History  . Not on file  Tobacco Use  . Smoking status: Never Smoker  . Smokeless tobacco: Never Used  Substance and Sexual Activity  . Alcohol use: No  . Drug use: No  . Sexual activity: Not Currently

## 2019-08-27 ENCOUNTER — Telehealth: Payer: Self-pay | Admitting: Specialist

## 2019-08-27 ENCOUNTER — Other Ambulatory Visit: Payer: Self-pay | Admitting: Radiology

## 2019-08-27 DIAGNOSIS — M47816 Spondylosis without myelopathy or radiculopathy, lumbar region: Secondary | ICD-10-CM

## 2019-08-27 DIAGNOSIS — M4326 Fusion of spine, lumbar region: Secondary | ICD-10-CM

## 2019-08-27 NOTE — Telephone Encounter (Signed)
Pt called in said she seen Dr.Nitka on 07-24-19 and was told Dr.Newton was going to reach out to her and get her set up for injections in her back but pt said it's been a month now and she still hasn't heard anything? Please give pt a call   [phone]

## 2019-08-27 NOTE — Telephone Encounter (Signed)
Order has been placed.

## 2019-09-10 ENCOUNTER — Other Ambulatory Visit: Payer: Self-pay

## 2019-09-10 ENCOUNTER — Ambulatory Visit: Payer: Self-pay

## 2019-09-10 ENCOUNTER — Ambulatory Visit (INDEPENDENT_AMBULATORY_CARE_PROVIDER_SITE_OTHER): Payer: Medicare Other | Admitting: Physical Medicine and Rehabilitation

## 2019-09-10 ENCOUNTER — Encounter: Payer: Self-pay | Admitting: Physical Medicine and Rehabilitation

## 2019-09-10 VITALS — BP 149/85 | HR 88

## 2019-09-10 DIAGNOSIS — M47816 Spondylosis without myelopathy or radiculopathy, lumbar region: Secondary | ICD-10-CM | POA: Diagnosis not present

## 2019-09-10 DIAGNOSIS — Z981 Arthrodesis status: Secondary | ICD-10-CM

## 2019-09-10 MED ORDER — METHYLPREDNISOLONE ACETATE 80 MG/ML IJ SUSP
40.0000 mg | Freq: Once | INTRAMUSCULAR | Status: AC
  Administered 2019-09-10: 14:00:00 40 mg

## 2019-09-10 NOTE — Progress Notes (Signed)
 .  Numeric Pain Rating Scale and Functional Assessment Average Pain 5   In the last MONTH (on 0-10 scale) has pain interfered with the following?  1. General activity like being  able to carry out your everyday physical activities such as walking, climbing stairs, carrying groceries, or moving a chair?  Rating(6)   +Driver, -BT, -Dye Allergies.  

## 2019-09-11 NOTE — Progress Notes (Signed)
[name] - 69 y.o. female MRN [id number]  Date of birth: [birth year]  Office Visit Note: Visit Date: 09/10/2019 PCP: Joaquin Courts, DO Referred by: Joaquin Courts, DO  Subjective: Chief Complaint  Patient presents with  . Lower Back - Pain   HPI: Laura Solis is a 69 y.o. female who comes in today For planned bilateral medial branch block/facet blocks at L2-3 above the patient's fusion.  She has had axial low back pain center of the low back really since her surgery in 2019.  She gets a lot of pain with bending.  She has had medication management without relief as well as therapy.  She has had sacroiliac joint injection with ultrasound guidance by another practitioner in the office without much relief.  Her case is complicated by fibromyalgia.  We will complete diagnostic and hopefully therapeutic blocks above the fusion with pain diary.  We did go over radiofrequency ablation as a possible step towards more definitive care if she were to get relief during the diagnostic phase of this injection.  We would have to do a double block paradigm and this was mentioned to her.  ROS Otherwise per HPI.  Assessment & Plan: Visit Diagnoses:  1. Spondylosis without myelopathy or radiculopathy, lumbar region   2. S/P lumbar fusion     Plan: No additional findings.   Meds & Orders:  Meds ordered this encounter  Medications  . methylPREDNISolone acetate (DEPO-MEDROL) injection 40 mg    Orders Placed This Encounter  Procedures  . Facet Injection  . XR C-ARM NO REPORT    Follow-up: Return for [name], M.D..   Procedures: No procedures performed  Lumbar Diagnostic Facet Joint Nerve Block with Fluoroscopic Guidance   Patient: Laura Solis      Date of Birth: [birth year] MRN: [id number] PCP: Joaquin Courts, DO      Visit Date: 09/10/2019   Universal Protocol:    Date/Time: 02/10/215:04 AM  Consent Given By: the patient  Position: PRONE  Additional  Comments: Vital signs were monitored before and after the procedure. Patient was prepped and draped in the usual sterile fashion. The correct patient, procedure, and site was verified.   Injection Procedure Details:  Procedure Site One Meds Administered:  Meds ordered this encounter  Medications  . methylPREDNISolone acetate (DEPO-MEDROL) injection 40 mg     Laterality: Bilateral  Location/Site:  L2-L3  Needle size: 22 ga.  Needle type:spinal  Needle Placement: Oblique pedical  Findings:   -Comments: There was excellent flow of contrast along the articular pillars without intravascular flow.  Procedure Details: The fluoroscope beam is vertically oriented in AP and then obliqued 15 to 20 degrees to the ipsilateral side of the desired nerve to achieve the "Scotty dog" appearance.  The skin over the target area of the junction of the superior articulating process and the transverse process (sacral ala if blocking the L5 dorsal rami) was locally anesthetized with a 1 ml volume of 1% Lidocaine without Epinephrine.  The spinal needle was inserted and advanced in a trajectory view down to the target.   After contact with periosteum and negative aspirate for blood and CSF, correct placement without intravascular or epidural spread was confirmed by injecting 0.5 ml. of Isovue-250.  A spot radiograph was obtained of this image.    Next, a 0.5 ml. volume of the injectate described above was injected. The needle was then redirected to the other facet joint nerves mentioned above if needed.  Prior to the procedure, the patient was given a Pain Diary which was completed for baseline measurements.  After the procedure, the patient rated their pain every 30 minutes and will continue rating at this frequency for a total of 5 hours.  The patient has been asked to complete the Diary and return to [place] by mail, fax or hand delivered as soon as possible.   Additional Comments:  The patient tolerated  the procedure well Dressing: 2 x 2 sterile gauze and Band-Aid    Post-procedure details: Patient was observed during the procedure. Post-procedure instructions were reviewed.  Patient left the clinic in stable condition.    Clinical History: AP and lateral radiograph of the lumbar spine show Lumbar fusion L3 to S1  with DDD L1-2 and L2-3. Spondylosis of the proximal adjacent level with  the mPACT screw in close approximation.   She reports that she has never smoked. She has never used smokeless tobacco. No results for input(s): HGBA1C, LABURIC in the last 8760 hours.  Objective:  VS:  HT:    WT:   BMI:     BP:(!) 149/85  HR:88bpm  TEMP: ( )  RESP:  Physical Exam  Ortho Exam Imaging: XR C-ARM NO REPORT  Result Date: 09/10/2019 Please see Notes tab for imaging impression.   Past Medical/Family/Surgical/Social History: Medications & Allergies reviewed per EMR, new medications updated. Patient Active Problem List   Diagnosis Date Noted  . Chronic pain of left knee 04/11/2018  . Bilateral primary osteoarthritis of knee 04/11/2018  . Anemia associated with acute blood loss 01/17/2018    Class: Acute  . 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 Pseudarthrosis after fusion or arthrodesis 01/16/2018    Class: Chronic  . S/P lumbar spinal fusion 01/16/2018  . Impingement syndrome of left shoulder 12/14/2017  . S/P lumbar fusion 01/26/2017  . Postoperative urinary retention 01/11/2017  . Lumbar stenosis 01/09/2017  . Restless legs 11/14/2016  . Osteoarthritis 11/14/2016  . 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 Attention deficit hyperactivity disorder 11/14/2016  . Anxiety 11/14/2016  . Adult attention deficit hyperactivity disorder 11/14/2016  . Osteoarthritis of right knee 10/03/2012  . Fibromyalgia syndrome 10/03/2012  . Obesity (BMI 30.0-34.9) 10/03/2012   Past Medical History:  Diagnosis Date  . ADHD (attention deficit  hyperactivity disorder)   . Anxiety    "from the fibromyalgia"  . Chronic lower back pain   . Fibromyalgia   . Headache    "bad one; monthly" (01/11/2017)  . Osteoarthritis    "all over" (01/11/2017)  . Pneumonia    "I've had it about 3 times" (01/11/2017)  . PONV (postoperative nausea and vomiting)   . Rotator cuff tear    left shoulder   History reviewed. No pertinent family history. Past Surgical History:  Procedure Laterality Date  . BACK SURGERY    . CARPAL TUNNEL RELEASE Bilateral   . CYST EXCISION     behind rt ear,coxxyx  . CYSTECTOMY N/A    from spine.   Marland Kitchen DILATION AND CURETTAGE OF UTERUS    . SHOULDER OPEN ROTATOR CUFF REPAIR Bilateral   . TONSILLECTOMY    . TOTAL KNEE ARTHROPLASTY Right 10/02/2012   Procedure: TOTAL KNEE ARTHROPLASTY;  Surgeon: Garald Balding, MD;  Location: [facility];  Service: Orthopedics;  Laterality: Right;  Right Total Knee Arthroplasty  . TRANSFORAMINAL LUMBAR INTERBODY FUSION (TLIF) WITH PEDICLE SCREW FIXATION 2 LEVEL Left 01/09/2017   L3-4,  L4-5/notes 01/09/2017  . TUBAL LIGATION    . VAGINAL HYSTERECTOMY     Social History   Occupational History  . Not on file  Tobacco Use  . Smoking status: Never Smoker  . Smokeless tobacco: Never Used  Substance and Sexual Activity  . Alcohol use: No  . Drug use: No  . Sexual activity: Not Currently

## 2019-09-11 NOTE — Procedures (Signed)
Lumbar Diagnostic Facet Joint Nerve Block with Fluoroscopic Guidance   Patient: Laura Solis      Date of Birth: 1950-11-01 MRN: [id number] PCP: Joaquin Courts, DO      Visit Date: 09/10/2019   Universal Protocol:    Date/Time: 02/10/215:04 AM  Consent Given By: the patient  Position: PRONE  Additional Comments: Vital signs were monitored before and after the procedure. Patient was prepped and draped in the usual sterile fashion. The correct patient, procedure, and site was verified.   Injection Procedure Details:  Procedure Site One Meds Administered:  Meds ordered this encounter  Medications  . methylPREDNISolone acetate (DEPO-MEDROL) injection 40 mg     Laterality: Bilateral  Location/Site:  L2-L3  Needle size: 22 ga.  Needle type:spinal  Needle Placement: Oblique pedical  Findings:   -Comments: There was excellent flow of contrast along the articular pillars without intravascular flow.  Procedure Details: The fluoroscope beam is vertically oriented in AP and then obliqued 15 to 20 degrees to the ipsilateral side of the desired nerve to achieve the "Scotty dog" appearance.  The skin over the target area of the junction of the superior articulating process and the transverse process (sacral ala if blocking the L5 dorsal rami) was locally anesthetized with a 1 ml volume of 1% Lidocaine without Epinephrine.  The spinal needle was inserted and advanced in a trajectory view down to the target.   After contact with periosteum and negative aspirate for blood and CSF, correct placement without intravascular or epidural spread was confirmed by injecting 0.5 ml. of Isovue-250.  A spot radiograph was obtained of this image.    Next, a 0.5 ml. volume of the injectate described above was injected. The needle was then redirected to the other facet joint nerves mentioned above if needed.  Prior to the procedure, the patient was given a Pain Diary which was completed for  baseline measurements.  After the procedure, the patient rated their pain every 30 minutes and will continue rating at this frequency for a total of 5 hours.  The patient has been asked to complete the Diary and return to [place] by mail, fax or hand delivered as soon as possible.   Additional Comments:  The patient tolerated the procedure well Dressing: 2 x 2 sterile gauze and Band-Aid    Post-procedure details: Patient was observed during the procedure. Post-procedure instructions were reviewed.  Patient left the clinic in stable condition.

## 2019-10-03 ENCOUNTER — Other Ambulatory Visit: Payer: Self-pay

## 2019-10-03 ENCOUNTER — Ambulatory Visit (INDEPENDENT_AMBULATORY_CARE_PROVIDER_SITE_OTHER): Payer: Medicare Other | Admitting: Orthopaedic Surgery

## 2019-10-03 ENCOUNTER — Ambulatory Visit (INDEPENDENT_AMBULATORY_CARE_PROVIDER_SITE_OTHER): Payer: Medicare Other

## 2019-10-03 ENCOUNTER — Encounter: Payer: Self-pay | Admitting: Orthopaedic Surgery

## 2019-10-03 VITALS — BP 132/65 | HR 86 | Ht 65.0 in | Wt 220.0 lb

## 2019-10-03 DIAGNOSIS — G8929 Other chronic pain: Secondary | ICD-10-CM

## 2019-10-03 DIAGNOSIS — M25512 Pain in left shoulder: Secondary | ICD-10-CM

## 2019-10-06 DIAGNOSIS — M25512 Pain in left shoulder: Secondary | ICD-10-CM

## 2019-10-06 DIAGNOSIS — G8929 Other chronic pain: Secondary | ICD-10-CM

## 2019-10-06 MED ORDER — BUPIVACAINE HCL 0.25 % IJ SOLN
4.0000 mL | INTRAMUSCULAR | Status: AC | PRN
  Administered 2019-10-06: 4 mL via INTRA_ARTICULAR

## 2019-10-06 MED ORDER — METHYLPREDNISOLONE ACETATE 40 MG/ML IJ SUSP
40.0000 mg | INTRAMUSCULAR | Status: AC | PRN
  Administered 2019-10-06: 40 mg via INTRA_ARTICULAR

## 2019-10-06 MED ORDER — LIDOCAINE HCL 1 % IJ SOLN
0.5000 mL | INTRAMUSCULAR | Status: AC | PRN
  Administered 2019-10-06: .5 mL

## 2019-10-06 NOTE — Progress Notes (Signed)
Office Visit Note   Patient: Laura Solis           Date of Birth: [birth year]           MRN: [id number] Visit Date: 10/03/2019              Requested by: Joaquin Courts, DO [address] [place],  [place] [address] PCP: Joaquin Courts, DO   Assessment & Plan: Visit Diagnoses:  1. Chronic left shoulder pain     Plan: Subacromial injection performed.  She had improvement in her pain if she is having persistent problems she will let us know.  She may have recurrent rotator cuff tear but had good supraspinatus strength on isolated testing.  She has been through therapy exercises anti-inflammatories without relief.  Patient states she like to proceed with MRI scan to evaluate her rotator cuff since she is concerned that she has a recurrent tear.  Follow-up after scan for review.  Follow-Up Instructions: No follow-ups on file.   Orders:  Orders Placed This Encounter  Procedures  . XR Shoulder Left  . MR Shoulder Left w/o contrast   No orders of the defined types were placed in this encounter.     Procedures: Large Joint Inj: L subacromial bursa on 10/06/2019 1:00 PM Indications: pain Details: 22 G 1.5 in needle  Arthrogram: No  Medications: 4 mL bupivacaine 0.25 %; 40 mg methylPREDNISolone acetate 40 MG/ML; 0.5 mL lidocaine 1 % Outcome: tolerated well, no immediate complications Procedure, treatment alternatives, risks and benefits explained, specific risks discussed. Consent was given by the patient. Immediately prior to procedure a time out was called to verify the correct patient, procedure, equipment, support staff and site/side marked as required. Patient was prepped and draped in the usual sterile fashion.       Clinical Data: No additional findings.   Subjective: Chief Complaint  Patient presents with  . Left Shoulder - Pain    HPI 69 year old female seen with chronic pain in her left shoulder.  She is taken anti-inflammatories without relief.   She has pain with outstretch reaching overhead activities.  Other problems include fibromyalgia osteoarthritis of her knee previous lumbar fusion and restless legs.  She does not recall specific injury to her shoulder.  Review of Systems previous lumbar surgery revision for migrated cage with solid fusion.  ADD.   Objective: Vital Signs: BP 132/65   Pulse 86   Ht 5\' 5"  (1.651 m)   Wt 220 lb (99.8 kg)   BMI 36.61 kg/m   Physical Exam Constitutional:      Appearance: She is well-developed.  HENT:     Head: Normocephalic.     Right Ear: External ear normal.     Left Ear: External ear normal.  Eyes:     Pupils: Pupils are equal, round, and reactive to light.  Neck:     Thyroid: No thyromegaly.     Trachea: No tracheal deviation.  Cardiovascular:     Rate and Rhythm: Normal rate.  Pulmonary:     Effort: Pulmonary effort is normal.  Abdominal:     Palpations: Abdomen is soft.  Skin:    General: Skin is warm and dry.  Neurological:     Mental Status: She is alert and oriented to person, place, and time.  Psychiatric:        Behavior: Behavior normal.     Ortho Exam patient has positive impingement left shoulder negative right shoulder.  Well-healed lumbar incision anterior  tib gastrocsoleus is strong.  Negative drop arm test.  Shoulder incision from previous rotator cuff repair without cellulitis.  Acromioclavicular joint is minimally tender.  Specialty Comments:  No specialty comments available.  Imaging: No results found.   PMFS History: Patient Active Problem List   Diagnosis Date Noted  . Chronic pain of left knee 04/11/2018  . Bilateral primary osteoarthritis of knee 04/11/2018  . Anemia associated with acute blood loss 01/17/2018    Class: Acute  . 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 Pseudarthrosis after fusion or arthrodesis 01/16/2018    Class: Chronic  . S/P lumbar spinal fusion 01/16/2018  . Impingement syndrome of left  shoulder 12/14/2017  . S/P lumbar fusion 01/26/2017  . Postoperative urinary retention 01/11/2017  . Lumbar stenosis 01/09/2017  . Restless legs 11/14/2016  . Osteoarthritis 11/14/2016  . 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 Attention deficit hyperactivity disorder 11/14/2016  . Anxiety 11/14/2016  . Adult attention deficit hyperactivity disorder 11/14/2016  . Osteoarthritis of right knee 10/03/2012  . Fibromyalgia syndrome 10/03/2012  . Obesity (BMI 30.0-34.9) 10/03/2012   Past Medical History:  Diagnosis Date  . ADHD (attention deficit hyperactivity disorder)   . Anxiety    "from the fibromyalgia"  . Chronic lower back pain   . Fibromyalgia   . Headache    "bad one; monthly" (01/11/2017)  . Osteoarthritis    "all over" (01/11/2017)  . Pneumonia    "I've had it about 3 times" (01/11/2017)  . PONV (postoperative nausea and vomiting)   . Rotator cuff tear    left shoulder    No family history on file.  Past Surgical History:  Procedure Laterality Date  . BACK SURGERY    . CARPAL TUNNEL RELEASE Bilateral   . CYST EXCISION     behind rt ear,coxxyx  . CYSTECTOMY N/A    from spine.   Marland Kitchen DILATION AND CURETTAGE OF UTERUS    . SHOULDER OPEN ROTATOR CUFF REPAIR Bilateral   . TONSILLECTOMY    . TOTAL KNEE ARTHROPLASTY Right 10/02/2012   Procedure: TOTAL KNEE ARTHROPLASTY;  Surgeon: Garald Balding, MD;  Location: [facility];  Service: Orthopedics;  Laterality: Right;  Right Total Knee Arthroplasty  . TRANSFORAMINAL LUMBAR INTERBODY FUSION (TLIF) WITH PEDICLE SCREW FIXATION 2 LEVEL Left 01/09/2017   L3-4, L4-5/notes 01/09/2017  . TUBAL LIGATION    . VAGINAL HYSTERECTOMY     Social History   Occupational History  . Not on file  Tobacco Use  . Smoking status: Never Smoker  . Smokeless tobacco: Never Used  Substance and Sexual Activity  . Alcohol use: No  . Drug use: No  . Sexual activity: Not Currently

## 2019-10-17 ENCOUNTER — Encounter: Payer: Self-pay | Admitting: Orthopaedic Surgery

## 2019-10-17 ENCOUNTER — Ambulatory Visit (INDEPENDENT_AMBULATORY_CARE_PROVIDER_SITE_OTHER): Payer: Medicare Other | Admitting: Orthopaedic Surgery

## 2019-10-17 ENCOUNTER — Other Ambulatory Visit: Payer: Self-pay

## 2019-10-17 DIAGNOSIS — M1712 Unilateral primary osteoarthritis, left knee: Secondary | ICD-10-CM | POA: Diagnosis not present

## 2019-10-18 DIAGNOSIS — M1712 Unilateral primary osteoarthritis, left knee: Secondary | ICD-10-CM | POA: Insufficient documentation

## 2019-10-18 NOTE — Progress Notes (Signed)
Office Visit Note   Patient: Laura Solis           Date of Birth: [birth year]           MRN: [id number] Visit Date: 10/17/2019              Requested by: [name], [place] [address] PCP: [name], DO   Assessment & Plan: Visit Diagnoses:  1. Unilateral primary osteoarthritis, left knee     Plan: We discussed treatment options for left shoulder which would be reverse total shoulder.  Her left knee is her principal pain generator with difficulty walking pain that wakes her up at night failed previous intra-articular injection use of anti-inflammatories without relief.  She has bone-on-bone changes at the patellofemoral joint on 2019 images with marginal osteophytes bone-on-bone changes lateral compartment.  There is subchondral sclerosis medial and patellofemoral compartment without varus or valgus malalignment.  She would like proceed with left total knee arthroplasty.  We discussed referral to Dr. Alphonzo Severance for discussion about reverse total shoulder but she states she like to wait on this at this time and proceed with treatment of her left knee since that wakes her up at night and is preventing her from ambulating in the community.  We discussed total knee arthroplasty postoperative home therapy outpatient therapy problems with stiffness.  We discussed spinal anesthesia preoperative block, Exparel as well as Marcaine.  Questions elicited and answered she requests we proceed.  Follow-Up Instructions: No follow-ups on file.   Orders:  No orders of the defined types were placed in this encounter.  No orders of the defined types were placed in this encounter.     Procedures: No procedures performed   Clinical Data: No additional findings.   Subjective: Chief Complaint  Patient presents with  . Left Shoulder - Pain, Follow-up    MRI Review    HPI 69 year old female returns with ongoing problems with her shoulder that  originally had surgery in [place] years ago with a rotator cuff repair with some metal anchors.  She has had weakness with abduction and has had progressive problems with left knee osteoarthritis with difficulty ambulating.  She has problems with stairs swelling in her knee times and is failed anti-inflammatories as well as home exercise program.  Previous lumbar surgery with revision for migrated cage now solid fusion.  Review of Systems previous rotator cuff repair done in [place] left shoulder.  Left shoulder progressive osteoarthritis failed intra-articular injections anti-inflammatories and use of a cane in the past.  Positive for ADD.  She has some problems with restless legs. Positive for anxiety fibromyalgia.  Negative for MI negative for CVA negative for chest pain negative for seizures.  No history of anesthetic or bleeding problems.  Objective: Vital Signs: BP 115/65   Pulse 85   Ht 5\' 5"  (1.651 m)   Wt 220 lb (99.8 kg)   BMI 36.61 kg/m   Physical Exam Constitutional:      Appearance: She is well-developed.  HENT:     Head: Normocephalic.     Right Ear: External ear normal.     Left Ear: External ear normal.  Eyes:     Pupils: Pupils are equal, round, and reactive to light.  Neck:     Thyroid: No thyromegaly.     Trachea: No tracheal deviation.  Cardiovascular:     Rate and Rhythm: Normal rate.  Pulmonary:     Effort: Pulmonary  effort is normal.  Abdominal:     Palpations: Abdomen is soft.  Skin:    General: Skin is warm and dry.  Neurological:     Mental Status: She is alert and oriented to person, place, and time.  Psychiatric:        Behavior: Behavior normal.     Ortho Exam patient has negative logroll the hips.  Positive drop arm test left shoulder.  Severe crepitus with knee range of motion.  She has full extension of her knee pain with hyperextension.  Severe patellofemoral crepitus palpable lateral osteophytes no varus or valgus deformity distal  pulses are 2+.  Normal hip range of motion.  Specialty Comments:  No specialty comments available.  Imaging: Shoulder MRI read on 10/15/2019 by Dr. Duanne Guess DO showed postsurgical changes with prior rotator cuff repair with torn and retracted supraspinatus and infraspinatus with marked atrophy and fatty infiltration of the supraspinatus and infraspinatus and teres minor muscles.  Moderate glenohumeral and mild acromioclavicular arthritis was noted.  Scan was done at [facility].   PMFS History: Patient Active Problem List   Diagnosis Date Noted  . Unilateral primary osteoarthritis, left knee 10/18/2019  . Chronic pain of left knee 04/11/2018  . Bilateral primary osteoarthritis of knee 04/11/2018  . Anemia associated with acute blood loss 01/17/2018    Class: Acute  . 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 S/P lumbar spinal fusion 01/16/2018  . Impingement syndrome of left shoulder 12/14/2017  . S/P lumbar fusion 01/26/2017  . Postoperative urinary retention 01/11/2017  . Lumbar stenosis 01/09/2017  . Restless legs 11/14/2016  . Osteoarthritis 11/14/2016  . 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 Attention deficit hyperactivity disorder 11/14/2016  . Anxiety 11/14/2016  . Adult attention deficit hyperactivity disorder 11/14/2016  . Osteoarthritis of right knee 10/03/2012  . Fibromyalgia syndrome 10/03/2012  . Obesity (BMI 30.0-34.9) 10/03/2012   Past Medical History:  Diagnosis Date  . ADHD (attention deficit hyperactivity disorder)   . Anxiety    "from the fibromyalgia"  . Chronic lower back pain   . Fibromyalgia   . Headache    "bad one; monthly" (01/11/2017)  . Osteoarthritis    "all over" (01/11/2017)  . Pneumonia    "I've had it about 3 times" (01/11/2017)  . PONV (postoperative nausea and vomiting)   . Rotator cuff tear    left shoulder    No family history on file.  Past Surgical History:    Procedure Laterality Date  . BACK SURGERY    . CARPAL TUNNEL RELEASE Bilateral   . CYST EXCISION     behind rt ear,coxxyx  . CYSTECTOMY N/A    from spine.   Marland Kitchen DILATION AND CURETTAGE OF UTERUS    . SHOULDER OPEN ROTATOR CUFF REPAIR Bilateral   . TONSILLECTOMY    . TOTAL KNEE ARTHROPLASTY Right 10/02/2012   Procedure: TOTAL KNEE ARTHROPLASTY;  Surgeon: Valeria Batman, MD;  Location: [facility];  Service: Orthopedics;  Laterality: Right;  Right Total Knee Arthroplasty  . TRANSFORAMINAL LUMBAR INTERBODY FUSION (TLIF) WITH PEDICLE SCREW FIXATION 2 LEVEL Left 01/09/2017   L3-4, L4-5/notes 01/09/2017  . TUBAL LIGATION    . VAGINAL HYSTERECTOMY     Social History   Occupational History  . Not on file  Tobacco Use  . Smoking status: Never Smoker  . Smokeless tobacco: Never Used  Substance and Sexual Activity  . Alcohol use: No  .  Drug use: No  . Sexual activity: Not Currently

## 2019-11-07 ENCOUNTER — Telehealth: Payer: Self-pay | Admitting: Radiology

## 2019-11-07 NOTE — Telephone Encounter (Signed)
Patient called [facility] office requesting return call in regards to surgery scheduling. I tried to call patient back, but number was blocked.   CB for patient is [phone]

## 2019-11-08 NOTE — Telephone Encounter (Signed)
Debbie please see note below. Patient said that Dr Ophelia Charter had told her someone would be calling to get her scheduled.  If you have Blue Sheet for patient please reach out to her to get her scheduled.

## 2019-11-08 NOTE — Telephone Encounter (Signed)
I talked with patient. She said that she is ready to get surgery scheduled. She is out for summer break on 12/20/19. She would like to have surgery scheduled ASAP so she will be ready to return back to work on 03/10/20 if possible.

## 2019-11-25 ENCOUNTER — Other Ambulatory Visit: Payer: Self-pay

## 2019-12-17 NOTE — Pre-Procedure Instructions (Signed)
[facility] [phone] [place], [place] - [address] [place] [address] Phone: [phone] Fax: [phone]     Your procedure is scheduled on Wednesday, Dec 25, 2019, from 12:30 PM- 2:34 PM.  Report to [facility] Main Entrance "A" at 10:30 A.M., and check in at the Admitting office.  Call this number if you have problems the morning of surgery:  [phone]  Call [phone] if you have any questions prior to your surgery date Monday-Friday 8am-4pm.    Remember:  Do not eat after midnight the night before your surgery.  You may drink clear liquids until 09:30 AM the morning of your surgery.    Clear liquids allowed are: Water, Non-Citrus Juices (without pulp), Carbonated Beverages, Clear Tea, Black Coffee Only, and Gatorade.   Enhanced Recovery after Surgery for Orthopedics Enhanced Recovery after Surgery is a protocol used to improve the stress on your body and your recovery after surgery.  Patient Instructions  . The night before surgery:  o No food after midnight. ONLY clear liquids after midnight   . The day of surgery (if you do NOT have diabetes):  o Drink ONE (1) Pre-Surgery Clear Ensure by 09:30 AM.   o This drink was given to you during your hospital  pre-op appointment visit. o Nothing else to drink after completing the  Pre-Surgery Clear Ensure.     Take these medicines the morning of surgery with A SIP OF WATER : NONE   As of today, STOP taking any Aspirin (unless otherwise instructed by your surgeon) and Aspirin containing products, Aleve, Naproxen, Ibuprofen, Motrin, Advil, Goody's, BC's, all herbal medications, fish oil, and all vitamins.                      Do not wear jewelry, make up, or nail polish.            Do not wear lotions, powders, perfumes, or deodorant.            Do not shave 48 hours prior to surgery.              Do not bring valuables to the hospital.            [facility] is not responsible for any  belongings or valuables.  Do NOT Smoke (Tobacco/Vapping) or drink Alcohol 24 hours prior to your procedure.  If you use a CPAP at night, you may bring all equipment for your overnight stay.   Contacts, glasses, dentures or bridgework may not be worn into surgery.      For patients admitted to the hospital, discharge time will be determined by your treatment team.   Patients discharged the day of surgery will not be allowed to drive home, and someone needs to stay with them for 24 hours.    Special instructions:   [facility]- Preparing For Surgery  Before surgery, you can play an important role. Because skin is not sterile, your skin needs to be as free of germs as possible. You can reduce the number of germs on your skin by washing with CHG (chlorahexidine gluconate) Soap before surgery.  CHG is an antiseptic cleaner which kills germs and bonds with the skin to continue killing germs even after washing.    Oral Hygiene is also important to reduce your risk of infection.  Remember - BRUSH YOUR TEETH THE MORNING OF SURGERY WITH YOUR REGULAR TOOTHPASTE  Please do not use if you have an allergy  to CHG or antibacterial soaps. If your skin becomes reddened/irritated stop using the CHG.  Do not shave (including legs and underarms) for at least 48 hours prior to first CHG shower. It is OK to shave your face.  Please follow these instructions carefully.   1. Shower the NIGHT BEFORE SURGERY and the MORNING OF SURGERY with CHG Soap.   2. If you chose to wash your hair, wash your hair first as usual with your normal shampoo.  3. After you shampoo, rinse your hair and body thoroughly to remove the shampoo.  4. Use CHG as you would any other liquid soap. You can apply CHG directly to the skin and wash gently with a scrungie or a clean washcloth.   5. Apply the CHG Soap to your body ONLY FROM THE NECK DOWN.  Do not use on open wounds or open sores. Avoid contact with your eyes, ears, mouth and  genitals (private parts). Wash Face and genitals (private parts)  with your normal soap.   6. Wash thoroughly, paying special attention to the area where your surgery will be performed.  7. Thoroughly rinse your body with warm water from the neck down.  8. DO NOT shower/wash with your normal soap after using and rinsing off the CHG Soap.  9. Pat yourself dry with a CLEAN TOWEL.  10. Wear CLEAN PAJAMAS to bed the night before surgery, wear comfortable clothes the morning of surgery  11. Place CLEAN SHEETS on your bed the night of your first shower and DO NOT SLEEP WITH PETS.   Day of Surgery:   Do not apply any deodorants/lotions.  Please wear clean clothes to the hospital/surgery center.   Remember to brush your teeth WITH YOUR REGULAR TOOTHPASTE.   Please read over the following fact sheets that you were given.

## 2019-12-18 ENCOUNTER — Ambulatory Visit (HOSPITAL_COMMUNITY)
Admission: RE | Admit: 2019-12-18 | Discharge: 2019-12-18 | Disposition: A | Discharge status: Home / Self Care | Payer: Medicare Other | Source: Ambulatory Visit | Attending: Surgery | Admitting: Surgery

## 2019-12-18 ENCOUNTER — Other Ambulatory Visit: Payer: Self-pay

## 2019-12-18 ENCOUNTER — Encounter (HOSPITAL_COMMUNITY): Payer: Self-pay

## 2019-12-18 ENCOUNTER — Encounter (HOSPITAL_COMMUNITY)
Admission: RE | Admit: 2019-12-18 | Discharge: 2019-12-18 | Disposition: A | Discharge status: Home / Self Care | Payer: Medicare Other | Source: Ambulatory Visit | Attending: Orthopaedic Surgery | Admitting: Orthopaedic Surgery

## 2019-12-18 DIAGNOSIS — Z01818 Encounter for other preprocedural examination: Secondary | ICD-10-CM | POA: Diagnosis present

## 2019-12-18 LAB — URINALYSIS, ROUTINE W REFLEX MICROSCOPIC
Bilirubin Urine: NEGATIVE
Glucose, UA: 50 mg/dL — AB
Hgb urine dipstick: NEGATIVE
Ketones, ur: NEGATIVE mg/dL
Leukocytes,Ua: NEGATIVE
Nitrite: NEGATIVE
Protein, ur: NEGATIVE mg/dL
Specific Gravity, Urine: 1.02 (ref 1.005–1.030)
pH: 5 (ref 5.0–8.0)

## 2019-12-18 LAB — COMPREHENSIVE METABOLIC PANEL
ALT: 29 U/L (ref 0–44)
AST: 28 U/L (ref 15–41)
Albumin: 3.3 g/dL — ABNORMAL LOW (ref 3.5–5.0)
Alkaline Phosphatase: 85 U/L (ref 38–126)
Anion gap: 9 (ref 5–15)
BUN: 10 mg/dL (ref 8–23)
CO2: 30 mmol/L (ref 22–32)
Calcium: 9.5 mg/dL (ref 8.9–10.3)
Chloride: 102 mmol/L (ref 98–111)
Creatinine, Ser: 0.75 mg/dL (ref 0.44–1.00)
GFR calc Af Amer: >60 mL/min (ref >60)
GFR calc non Af Amer: >60 mL/min (ref >60)
Glucose, Bld: 183 mg/dL — ABNORMAL HIGH (ref 70–99)
Potassium: 3.8 mmol/L (ref 3.5–5.1)
Sodium: 141 mmol/L (ref 135–145)
Total Bilirubin: 0.6 mg/dL (ref 0.3–1.2)
Total Protein: 6.7 g/dL (ref 6.5–8.1)

## 2019-12-18 LAB — CBC
HCT: 43.6 % (ref 36.0–46.0)
Hemoglobin: 13.8 g/dL (ref 12.0–15.0)
MCH: 31.6 pg (ref 26.0–34.0)
MCHC: 31.7 g/dL (ref 30.0–36.0)
MCV: 99.8 fL (ref 80.0–100.0)
Platelets: 230 10*3/uL (ref 150–400)
RBC: 4.37 MIL/uL (ref 3.87–5.11)
RDW: 13.4 % (ref 11.5–15.5)
WBC: 7.2 10*3/uL (ref 4.0–10.5)
nRBC: 0 % (ref 0.0–0.2)

## 2019-12-18 LAB — SURGICAL PCR SCREEN
MRSA, PCR: NEGATIVE
Staphylococcus aureus: NEGATIVE

## 2019-12-18 NOTE — Progress Notes (Signed)
PCP - Dr. Michel Santee in [place], [place] Cardiologist - None  Chest x-ray - 12/18/19 EKG - 12/18/19 Stress Test - None ECHO - None Cardiac Cath - None  Sleep Study - Has had one done in past, not diagnosed with OSA CPAP - No  DM - None  Blood Thinner Instructions: None Aspirin Instructions:Instructed to stop as of today  ERAS Protcol -Given and instructed   COVID TEST- 12/20/19   Patient denies shortness of breath, fever, cough and chest pain at PAT appointment   All instructions explained to the patient, with a verbal understanding of the material. Patient agrees to go over the instructions while at home for a better understanding. Patient also instructed to self quarantine after being tested for COVID-19. The opportunity to ask questions was provided.

## 2019-12-19 ENCOUNTER — Encounter: Payer: Self-pay | Admitting: Surgery

## 2019-12-19 ENCOUNTER — Ambulatory Visit (INDEPENDENT_AMBULATORY_CARE_PROVIDER_SITE_OTHER): Payer: Medicare Other | Admitting: Surgery

## 2019-12-19 VITALS — BP 139/85 | HR 82 | Ht 65.0 in | Wt 234.6 lb

## 2019-12-19 DIAGNOSIS — M1712 Unilateral primary osteoarthritis, left knee: Secondary | ICD-10-CM

## 2019-12-19 NOTE — Progress Notes (Signed)
69 year old white female history of end-stage DJD left knee and pain comes in for preop evaluation.  Knee symptoms unchanged from previous visit.  She is want to proceed with total knee replacement as scheduled.  Today history and physical performed.  Review of systems negative.  Patient is familiar about what to expect from total knee replacement since she had the right knee done by Dr. Cleophas Dunker.  All questions answered.

## 2019-12-20 ENCOUNTER — Other Ambulatory Visit (HOSPITAL_COMMUNITY)
Admission: RE | Admit: 2019-12-20 | Discharge: 2019-12-20 | Disposition: A | Discharge status: Home / Self Care | Payer: Medicare Other | Source: Ambulatory Visit | Attending: Orthopaedic Surgery | Admitting: Orthopaedic Surgery

## 2019-12-23 ENCOUNTER — Other Ambulatory Visit (HOSPITAL_COMMUNITY)
Admission: RE | Admit: 2019-12-23 | Discharge: 2019-12-23 | Disposition: A | Discharge status: Home / Self Care | Payer: Medicare Other | Source: Ambulatory Visit | Attending: Orthopaedic Surgery | Admitting: Orthopaedic Surgery

## 2019-12-23 ENCOUNTER — Other Ambulatory Visit: Payer: Self-pay

## 2019-12-23 LAB — SARS CORONAVIRUS 2 (TAT 6-24 HRS): SARS Coronavirus 2: NEGATIVE

## 2019-12-24 MED ORDER — BUPIVACAINE LIPOSOME 1.3 % IJ SUSP
20.0000 mL | Freq: Once | INTRAMUSCULAR | Status: DC
  Filled 2019-12-24: qty 20

## 2019-12-24 NOTE — H&P (Signed)
TOTAL KNEE ADMISSION H&P  Patient is being admitted for left total knee arthroplasty.  Subjective:  Chief Complaint:left knee pain.  HPI: Laura Solis, 69 y.o. female, has a history of pain and functional disability in the left knee due to arthritis and has failed non-surgical conservative treatments for greater than 12 weeks to includeNSAID's and/or analgesics, corticosteriod injections, use of assistive devices and activity modification.  Onset of symptoms was gradual, starting 10 years ago with gradually worsening course since that time.   Patient currently rates pain in the left knee(s) at 10 out of 10 with activity. Patient has night pain, worsening of pain with activity and weight bearing, pain that interferes with activities of daily living, pain with passive range of motion, crepitus and joint swelling.  Patient has evidence of subchondral sclerosis, periarticular osteophytes and joint space narrowing by imaging studies. . There is no active infection.  Patient Active Problem List   Diagnosis Date Noted  . Unilateral primary osteoarthritis, left knee 10/18/2019  . Chronic pain of left knee 04/11/2018  . Bilateral primary osteoarthritis of knee 04/11/2018  . Anemia associated with acute blood loss 01/17/2018  . 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 S/P lumbar spinal fusion 01/16/2018  . Impingement syndrome of left shoulder 12/14/2017  . S/P lumbar fusion 01/26/2017  . Postoperative urinary retention 01/11/2017  . Lumbar stenosis 01/09/2017  . Restless legs 11/14/2016  . Osteoarthritis 11/14/2016  . 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 Attention deficit hyperactivity disorder 11/14/2016  . Anxiety 11/14/2016  . Adult attention deficit hyperactivity disorder 11/14/2016  . Osteoarthritis of right knee 10/03/2012  . Fibromyalgia syndrome 10/03/2012  . Obesity (BMI 30.0-34.9) 10/03/2012   Past Medical History:  Diagnosis Date  . ADHD (attention  deficit hyperactivity disorder)   . Anxiety    "from the fibromyalgia"  . Chronic lower back pain   . Fibromyalgia   . Headache    "bad one; monthly" (01/11/2017)  . Osteoarthritis    "all over" (01/11/2017)  . Pneumonia    "I've had it about 3 times" (01/11/2017)  . PONV (postoperative nausea and vomiting)   . Rotator cuff tear    left shoulder    Past Surgical History:  Procedure Laterality Date  . APPENDECTOMY    . BACK SURGERY    . CARPAL TUNNEL RELEASE Bilateral   . CYST EXCISION     behind rt ear,coxxyx  . CYSTECTOMY N/A    from spine.   Marland Kitchen DILATION AND CURETTAGE OF UTERUS    . SHOULDER OPEN ROTATOR CUFF REPAIR Bilateral   . TONSILLECTOMY    . TOTAL KNEE ARTHROPLASTY Right 10/02/2012   Procedure: TOTAL KNEE ARTHROPLASTY;  Surgeon: Valeria Batman, MD;  Location: [facility] OR;  Service: Orthopedics;  Laterality: Right;  Right Total Knee Arthroplasty  . TRANSFORAMINAL LUMBAR INTERBODY FUSION (TLIF) WITH PEDICLE SCREW FIXATION 2 LEVEL Left 01/09/2017   L3-4, L4-5/notes 01/09/2017  . TUBAL LIGATION    . VAGINAL HYSTERECTOMY      Current Facility-Administered Medications  Medication Dose Route Frequency Provider Last Rate Last Admin  . [START ON 12/25/2019] bupivacaine liposome (EXPAREL) 1.3 % injection 266 mg  20 mL Infiltration Once Eldred Manges, MD       Current Outpatient Medications  Medication Sig Dispense Refill Last Dose  . ALPRAZolam (XANAX) 0.5 MG tablet Take 0.5 mg by mouth at bedtime.      Marland Kitchen amphetamine-dextroamphetamine (ADDERALL) 10 MG tablet Take 10 mg by  mouth 2 (two) times daily.     Marland Kitchen FLUoxetine (PROZAC) 20 MG capsule Take 20 mg by mouth at bedtime.      . gabapentin (NEURONTIN) 300 MG capsule Take 300 mg by mouth at bedtime.      Marland Kitchen ibuprofen (ADVIL) 600 MG tablet Take 600 mg by mouth every 6 (six) hours as needed for moderate pain.     . Multiple Vitamins-Minerals (MULTIVITAMIN WITH MINERALS) tablet Take 1 tablet by mouth daily.     . pramipexole (MIRAPEX) 1.5 MG  tablet Take 1.5 mg by mouth at bedtime.       Allergies  Allergen Reactions  . Other Other (See Comments)    Anesthesia causes nausea.     Social History   Tobacco Use  . Smoking status: Never Smoker  . Smokeless tobacco: Never Used  Substance Use Topics  . Alcohol use: No    No family history on file.   Review of Systems  Constitutional: Positive for activity change.  HENT: Negative.   Cardiovascular: Negative.   Gastrointestinal: Negative.   Genitourinary: Negative.   Musculoskeletal: Positive for gait problem and joint swelling.  Skin: Negative.   Psychiatric/Behavioral: Negative.     Objective:  Physical Exam  Constitutional: She is oriented to person, place, and time. She appears well-developed. No distress.  HENT:  Head: Normocephalic and atraumatic.  Eyes: Pupils are equal, round, and reactive to light. EOM are normal.  Cardiovascular: Normal heart sounds.  Respiratory: No respiratory distress.  GI: Soft. She exhibits no distension. There is no abdominal tenderness.  Musculoskeletal:        General: Tenderness present.  Neurological: She is alert and oriented to person, place, and time.  Skin: Skin is warm and dry.  Psychiatric: She has a normal mood and affect.    Vital signs in last 24 hours:    Labs:   Estimated body mass index is 39.04 kg/m as calculated from the following:   Height as of 12/19/19: 5\' 5"  (1.651 m).   Weight as of 12/19/19: 106.4 kg.   Imaging Review Plain radiographs demonstrate moderate degenerative joint disease of the left knee(s). The overall alignment ismild varus. The bone quality appears to be good for age and reported activity level.      Assessment/Plan:  End stage arthritis, left knee   The patient history, physical examination, clinical judgment of the provider and imaging studies are consistent with end stage degenerative joint disease of the left knee(s) and total knee arthroplasty is deemed medically  necessary. The treatment options including medical management, injection therapy arthroscopy and arthroplasty were discussed at length. The risks and benefits of total knee arthroplasty were presented and reviewed. The risks due to aseptic loosening, infection, stiffness, patella tracking problems, thromboembolic complications and other imponderables were discussed. The patient acknowledged the explanation, agreed to proceed with the plan and consent was signed. Patient is being admitted for inpatient treatment for surgery, pain control, PT, OT, prophylactic antibiotics, VTE prophylaxis, progressive ambulation and ADL's and discharge planning. The patient is planning to be discharged home with home health services    Anticipated LOS equal to or greater than 2 midnights due to - Age 64 and older with one or more of the following:  - Obesity  - Expected need for hospital services (PT, OT, Nursing) required for safe  discharge  - Anticipated need for postoperative skilled nursing care or inpatient rehab  - Active co-morbidities: None OR   - Unanticipated findings during/Post Surgery:  None  - Patient is a high risk of re-admission due to: None

## 2019-12-25 ENCOUNTER — Encounter (HOSPITAL_COMMUNITY)
Admission: RE | Disposition: A | Discharge status: Home / Self Care | Payer: Self-pay | Source: Home / Self Care | Attending: Orthopaedic Surgery

## 2019-12-25 ENCOUNTER — Ambulatory Visit (HOSPITAL_COMMUNITY): Payer: Medicare Other | Admitting: Physician Assistant

## 2019-12-25 ENCOUNTER — Observation Stay (HOSPITAL_COMMUNITY): Payer: Medicare Other

## 2019-12-25 ENCOUNTER — Inpatient Hospital Stay (HOSPITAL_COMMUNITY)
Admission: RE | Admit: 2019-12-25 | Discharge: 2019-12-27 | DRG: 470 | Disposition: A | Discharge status: Home Health | Payer: Medicare Other | Attending: Orthopaedic Surgery | Admitting: Orthopaedic Surgery

## 2019-12-25 ENCOUNTER — Other Ambulatory Visit: Payer: Self-pay

## 2019-12-25 ENCOUNTER — Encounter (HOSPITAL_COMMUNITY): Payer: Self-pay | Admitting: Orthopaedic Surgery

## 2019-12-25 ENCOUNTER — Ambulatory Visit (HOSPITAL_COMMUNITY): Payer: Medicare Other | Admitting: Certified Registered Nurse Anesthetist

## 2019-12-25 DIAGNOSIS — Z6837 Body mass index (BMI) 37.0-37.9, adult: Secondary | ICD-10-CM

## 2019-12-25 DIAGNOSIS — Z96651 Presence of right artificial knee joint: Secondary | ICD-10-CM | POA: Diagnosis present

## 2019-12-25 DIAGNOSIS — F909 Attention-deficit hyperactivity disorder, unspecified type: Secondary | ICD-10-CM | POA: Diagnosis present

## 2019-12-25 DIAGNOSIS — M797 Fibromyalgia: Secondary | ICD-10-CM | POA: Diagnosis present

## 2019-12-25 DIAGNOSIS — Z20822 Contact with and (suspected) exposure to covid-19: Secondary | ICD-10-CM | POA: Diagnosis present

## 2019-12-25 DIAGNOSIS — G2581 Restless legs syndrome: Secondary | ICD-10-CM | POA: Diagnosis present

## 2019-12-25 DIAGNOSIS — M1712 Unilateral primary osteoarthritis, left knee: Principal | ICD-10-CM | POA: Diagnosis present

## 2019-12-25 DIAGNOSIS — F418 Other specified anxiety disorders: Secondary | ICD-10-CM | POA: Diagnosis present

## 2019-12-25 DIAGNOSIS — Z09 Encounter for follow-up examination after completed treatment for conditions other than malignant neoplasm: Secondary | ICD-10-CM

## 2019-12-25 DIAGNOSIS — Z96659 Presence of unspecified artificial knee joint: Secondary | ICD-10-CM

## 2019-12-25 DIAGNOSIS — Z981 Arthrodesis status: Secondary | ICD-10-CM

## 2019-12-25 HISTORY — PX: TOTAL KNEE ARTHROPLASTY: SHX125

## 2019-12-25 LAB — GLUCOSE, CAPILLARY: Glucose-Capillary: 105 mg/dL — ABNORMAL HIGH (ref 70–99)

## 2019-12-25 SURGERY — ARTHROPLASTY, KNEE, TOTAL
Anesthesia: General | Site: Knee | Laterality: Left

## 2019-12-25 MED ORDER — ONDANSETRON HCL 4 MG/2ML IJ SOLN: 4.0000 mg | Freq: Four times a day (QID) | INTRAMUSCULAR | Status: DC | PRN

## 2019-12-25 MED ORDER — TRANEXAMIC ACID 1000 MG/10ML IV SOLN: INTRAVENOUS | Status: DC | PRN

## 2019-12-25 MED ORDER — BUPIVACAINE-EPINEPHRINE (PF) 0.5% -1:200000 IJ SOLN
INTRAMUSCULAR | Status: DC | PRN
  Administered 2019-12-25: 25 mL via PERINEURAL

## 2019-12-25 MED ORDER — OXYCODONE HCL 5 MG PO TABS
5.0000 mg | ORAL_TABLET | ORAL | Status: DC | PRN
  Administered 2019-12-25 – 2019-12-26 (×2): 10 mg via ORAL
  Administered 2019-12-26 – 2019-12-27 (×3): 5 mg via ORAL
  Filled 2019-12-25: qty 2
  Filled 2019-12-25: qty 1
  Filled 2019-12-25: qty 2
  Filled 2019-12-25 (×2): qty 1

## 2019-12-25 MED ORDER — ACETAMINOPHEN 325 MG PO TABS: 325.0000 mg | ORAL_TABLET | Freq: Four times a day (QID) | ORAL | Status: DC | PRN

## 2019-12-25 MED ORDER — METHOCARBAMOL 1000 MG/10ML IJ SOLN
500.0000 mg | Freq: Four times a day (QID) | INTRAVENOUS | Status: DC | PRN
  Filled 2019-12-25: qty 5

## 2019-12-25 MED ORDER — BUPIVACAINE HCL (PF) 0.25 % IJ SOLN
INTRAMUSCULAR | Status: AC
  Filled 2019-12-25: qty 30

## 2019-12-25 MED ORDER — CEFAZOLIN SODIUM-DEXTROSE 2-4 GM/100ML-% IV SOLN
2.0000 g | INTRAVENOUS | Status: DC
  Filled 2019-12-25: qty 100

## 2019-12-25 MED ORDER — PHENOL 1.4 % MT LIQD: 1.0000 | OROMUCOSAL | Status: DC | PRN

## 2019-12-25 MED ORDER — METOCLOPRAMIDE HCL 5 MG PO TABS: 5.0000 mg | ORAL_TABLET | Freq: Three times a day (TID) | ORAL | Status: DC | PRN

## 2019-12-25 MED ORDER — LACTATED RINGERS IV SOLN: INTRAVENOUS | Status: DC | PRN

## 2019-12-25 MED ORDER — SODIUM CHLORIDE 0.9 % IR SOLN
Status: DC | PRN
  Administered 2019-12-25: 3000 mL

## 2019-12-25 MED ORDER — TRANEXAMIC ACID-NACL 1000-0.7 MG/100ML-% IV SOLN
INTRAVENOUS | Status: AC
  Filled 2019-12-25: qty 100

## 2019-12-25 MED ORDER — DOCUSATE SODIUM 100 MG PO CAPS
100.0000 mg | ORAL_CAPSULE | Freq: Two times a day (BID) | ORAL | Status: DC
  Administered 2019-12-25 – 2019-12-27 (×4): 100 mg via ORAL
  Filled 2019-12-25 (×3): qty 1

## 2019-12-25 MED ORDER — DEXAMETHASONE SODIUM PHOSPHATE 10 MG/ML IJ SOLN
INTRAMUSCULAR | Status: DC | PRN
  Administered 2019-12-25: 10 mg via INTRAVENOUS

## 2019-12-25 MED ORDER — POLYETHYLENE GLYCOL 3350 17 G PO PACK: 17.0000 g | PACK | Freq: Every day | ORAL | Status: DC | PRN

## 2019-12-25 MED ORDER — TRANEXAMIC ACID-NACL 1000-0.7 MG/100ML-% IV SOLN
INTRAVENOUS | Status: DC | PRN
Start: 2019-12-25 — End: 2019-12-25
  Administered 2019-12-25: 1000 mg via INTRAVENOUS

## 2019-12-25 MED ORDER — SUGAMMADEX SODIUM 500 MG/5ML IV SOLN
INTRAVENOUS | Status: AC
  Filled 2019-12-25: qty 10

## 2019-12-25 MED ORDER — MIDAZOLAM HCL 2 MG/2ML IJ SOLN
1.0000 mg | Freq: Once | INTRAMUSCULAR | Status: AC
  Filled 2019-12-25: qty 1

## 2019-12-25 MED ORDER — MENTHOL 3 MG MT LOZG: 1.0000 | LOZENGE | OROMUCOSAL | Status: DC | PRN

## 2019-12-25 MED ORDER — FENTANYL CITRATE (PF) 250 MCG/5ML IJ SOLN
INTRAMUSCULAR | Status: AC
  Filled 2019-12-25: qty 5

## 2019-12-25 MED ORDER — FLUOXETINE HCL 20 MG PO CAPS
20.0000 mg | ORAL_CAPSULE | Freq: Every day | ORAL | Status: DC
  Administered 2019-12-25 – 2019-12-26 (×2): 20 mg via ORAL
  Filled 2019-12-25 (×2): qty 1

## 2019-12-25 MED ORDER — FENTANYL CITRATE (PF) 100 MCG/2ML IJ SOLN
INTRAMUSCULAR | Status: AC
  Filled 2019-12-25: qty 2

## 2019-12-25 MED ORDER — ONDANSETRON HCL 4 MG/2ML IJ SOLN
INTRAMUSCULAR | Status: AC
  Filled 2019-12-25: qty 2

## 2019-12-25 MED ORDER — FENTANYL CITRATE (PF) 250 MCG/5ML IJ SOLN
INTRAMUSCULAR | Status: DC | PRN
  Administered 2019-12-25 (×2): 50 ug via INTRAVENOUS
  Administered 2019-12-25: 100 ug via INTRAVENOUS

## 2019-12-25 MED ORDER — BUPIVACAINE HCL (PF) 0.25 % IJ SOLN
INTRAMUSCULAR | Status: DC | PRN
  Administered 2019-12-25: 20 mL

## 2019-12-25 MED ORDER — METHOCARBAMOL 500 MG PO TABS
500.0000 mg | ORAL_TABLET | Freq: Four times a day (QID) | ORAL | Status: DC | PRN
  Administered 2019-12-25 – 2019-12-26 (×2): 500 mg via ORAL
  Filled 2019-12-25 (×3): qty 1

## 2019-12-25 MED ORDER — CHLORHEXIDINE GLUCONATE 0.12 % MT SOLN
15.0000 mL | Freq: Once | OROMUCOSAL | Status: AC
  Administered 2019-12-25: 15 mL via OROMUCOSAL
  Filled 2019-12-25: qty 15

## 2019-12-25 MED ORDER — CEFAZOLIN SODIUM-DEXTROSE 2-3 GM-%(50ML) IV SOLR
INTRAVENOUS | Status: DC | PRN
  Administered 2019-12-25: 2 g via INTRAVENOUS

## 2019-12-25 MED ORDER — ONDANSETRON HCL 4 MG PO TABS: 4.0000 mg | ORAL_TABLET | Freq: Four times a day (QID) | ORAL | Status: DC | PRN

## 2019-12-25 MED ORDER — BUPIVACAINE LIPOSOME 1.3 % IJ SUSP
INTRAMUSCULAR | Status: DC | PRN
  Administered 2019-12-25: 20 mL

## 2019-12-25 MED ORDER — AMPHETAMINE-DEXTROAMPHETAMINE 10 MG PO TABS
10.0000 mg | ORAL_TABLET | Freq: Two times a day (BID) | ORAL | Status: DC
  Administered 2019-12-26 – 2019-12-27 (×4): 10 mg via ORAL
  Filled 2019-12-25 (×4): qty 1

## 2019-12-25 MED ORDER — ORAL CARE MOUTH RINSE: 15.0000 mL | Freq: Once | OROMUCOSAL | Status: AC

## 2019-12-25 MED ORDER — PRAMIPEXOLE DIHYDROCHLORIDE 1.5 MG PO TABS
1.5000 mg | ORAL_TABLET | Freq: Every day | ORAL | Status: DC
  Administered 2019-12-25 – 2019-12-26 (×2): 1.5 mg via ORAL
  Filled 2019-12-25 (×3): qty 1

## 2019-12-25 MED ORDER — FENTANYL CITRATE (PF) 100 MCG/2ML IJ SOLN
100.0000 ug | Freq: Once | INTRAMUSCULAR | Status: AC
  Administered 2019-12-25: 100 ug via INTRAVENOUS
  Filled 2019-12-25: qty 2

## 2019-12-25 MED ORDER — METOCLOPRAMIDE HCL 5 MG/ML IJ SOLN: 5.0000 mg | Freq: Three times a day (TID) | INTRAMUSCULAR | Status: DC | PRN

## 2019-12-25 MED ORDER — SUGAMMADEX SODIUM 200 MG/2ML IV SOLN
INTRAVENOUS | Status: DC | PRN
  Administered 2019-12-25: 200 mg via INTRAVENOUS

## 2019-12-25 MED ORDER — 0.9 % SODIUM CHLORIDE (POUR BTL) OPTIME
TOPICAL | Status: DC | PRN
  Administered 2019-12-25: 1000 mL

## 2019-12-25 MED ORDER — ROCURONIUM BROMIDE 10 MG/ML (PF) SYRINGE
PREFILLED_SYRINGE | INTRAVENOUS | Status: DC | PRN
  Administered 2019-12-25: 20 mg via INTRAVENOUS
  Administered 2019-12-25: 70 mg via INTRAVENOUS

## 2019-12-25 MED ORDER — PROPOFOL 10 MG/ML IV BOLUS
INTRAVENOUS | Status: DC | PRN
  Administered 2019-12-25: 150 mg via INTRAVENOUS

## 2019-12-25 MED ORDER — GABAPENTIN 300 MG PO CAPS
300.0000 mg | ORAL_CAPSULE | Freq: Every day | ORAL | Status: DC
  Administered 2019-12-25 – 2019-12-26 (×2): 300 mg via ORAL
  Filled 2019-12-25 (×2): qty 1

## 2019-12-25 MED ORDER — ASPIRIN EC 325 MG PO TBEC
325.0000 mg | DELAYED_RELEASE_TABLET | Freq: Every day | ORAL | Status: DC
  Administered 2019-12-26 – 2019-12-27 (×2): 325 mg via ORAL
  Filled 2019-12-25 (×2): qty 1

## 2019-12-25 MED ORDER — ONDANSETRON HCL 4 MG/2ML IJ SOLN
INTRAMUSCULAR | Status: DC | PRN
  Administered 2019-12-25: 4 mg via INTRAVENOUS

## 2019-12-25 MED ORDER — MIDAZOLAM HCL 2 MG/2ML IJ SOLN
INTRAMUSCULAR | Status: AC
  Filled 2019-12-25: qty 2

## 2019-12-25 MED ORDER — ROCURONIUM BROMIDE 10 MG/ML (PF) SYRINGE
PREFILLED_SYRINGE | INTRAVENOUS | Status: AC
  Filled 2019-12-25: qty 10

## 2019-12-25 MED ORDER — PHENYLEPHRINE 40 MCG/ML (10ML) SYRINGE FOR IV PUSH (FOR BLOOD PRESSURE SUPPORT)
PREFILLED_SYRINGE | INTRAVENOUS | Status: AC
  Filled 2019-12-25: qty 10

## 2019-12-25 MED ORDER — ALPRAZOLAM 0.5 MG PO TABS
0.5000 mg | ORAL_TABLET | Freq: Every day | ORAL | Status: DC
  Administered 2019-12-25 – 2019-12-26 (×2): 0.5 mg via ORAL
  Filled 2019-12-25 (×2): qty 1

## 2019-12-25 MED ORDER — ACETAMINOPHEN 500 MG PO TABS: 1000.0000 mg | ORAL_TABLET | Freq: Once | ORAL | Status: DC

## 2019-12-25 MED ORDER — MIDAZOLAM HCL 2 MG/2ML IJ SOLN
INTRAMUSCULAR | Status: DC | PRN
  Administered 2019-12-25: 1 mg via INTRAVENOUS

## 2019-12-25 MED ORDER — HYDROMORPHONE HCL 1 MG/ML IJ SOLN: 0.2500 mg | INTRAMUSCULAR | Status: DC | PRN

## 2019-12-25 MED ORDER — CEFAZOLIN SODIUM-DEXTROSE 2-4 GM/100ML-% IV SOLN
INTRAVENOUS | Status: AC
  Filled 2019-12-25: qty 100

## 2019-12-25 MED ORDER — DEXAMETHASONE SODIUM PHOSPHATE 10 MG/ML IJ SOLN
INTRAMUSCULAR | Status: AC
  Filled 2019-12-25: qty 1

## 2019-12-25 MED ORDER — SUGAMMADEX SODIUM 200 MG/2ML IV SOLN: INTRAVENOUS | Status: DC | PRN

## 2019-12-25 MED ORDER — MIDAZOLAM HCL 2 MG/2ML IJ SOLN
INTRAMUSCULAR | Status: AC
  Administered 2019-12-25: 1 mg via INTRAVENOUS
  Filled 2019-12-25: qty 2

## 2019-12-25 MED ORDER — HYDROMORPHONE HCL 1 MG/ML IJ SOLN: 0.5000 mg | INTRAMUSCULAR | Status: DC | PRN

## 2019-12-25 MED ORDER — PHENYLEPHRINE 40 MCG/ML (10ML) SYRINGE FOR IV PUSH (FOR BLOOD PRESSURE SUPPORT)
PREFILLED_SYRINGE | INTRAVENOUS | Status: DC | PRN
  Administered 2019-12-25: 40 ug via INTRAVENOUS
  Administered 2019-12-25: 80 ug via INTRAVENOUS
  Administered 2019-12-25: 40 ug via INTRAVENOUS
  Administered 2019-12-25: 80 ug via INTRAVENOUS
  Administered 2019-12-25: 40 ug via INTRAVENOUS
  Administered 2019-12-25: 80 ug via INTRAVENOUS

## 2019-12-25 MED ORDER — LACTATED RINGERS IV SOLN: INTRAVENOUS | Status: DC

## 2019-12-25 SURGICAL SUPPLY — 72 items
ATTUNE PS FEM LT SZ 3 CEM KNEE (Femur) ×3 IMPLANT
ATTUNE PSRP INSR SZ3 5 KNEE (Insert) ×2 IMPLANT
ATTUNE PSRP INSR SZ3 5MM KNEE (Insert) ×1 IMPLANT
BANDAGE ESMARK 6X9 LF (GAUZE/BANDAGES/DRESSINGS) ×1 IMPLANT
BASE TIBIAL ROT PLAT SZ 5 KNEE (Knees) ×1 IMPLANT
BENZOIN TINCTURE PRP APPL 2/3 (GAUZE/BANDAGES/DRESSINGS) ×3 IMPLANT
BLADE SAGITTAL 25.0X1.19X90 (BLADE) ×2 IMPLANT
BLADE SAGITTAL 25.0X1.19X90MM (BLADE) ×1
BLADE SAW SGTL 13X75X1.27 (BLADE) ×3 IMPLANT
BNDG ELASTIC 4X5.8 VLCR STR LF (GAUZE/BANDAGES/DRESSINGS) ×3 IMPLANT
BNDG ELASTIC 6X5.8 VLCR STR LF (GAUZE/BANDAGES/DRESSINGS) ×3 IMPLANT
BNDG ESMARK 6X9 LF (GAUZE/BANDAGES/DRESSINGS) ×3
BNDG GAUZE ELAST 4 BULKY (GAUZE/BANDAGES/DRESSINGS) ×3 IMPLANT
BOWL SMART MIX CTS (DISPOSABLE) ×3 IMPLANT
CEMENT HV SMART SET (Cement) ×6 IMPLANT
COVER SURGICAL LIGHT HANDLE (MISCELLANEOUS) ×3 IMPLANT
COVER WAND RF STERILE (DRAPES) ×3 IMPLANT
CUFF TOURN SGL QUICK 34 (TOURNIQUET CUFF) ×2
CUFF TOURN SGL QUICK 42 (TOURNIQUET CUFF) IMPLANT
CUFF TRNQT CYL 34X4.125X (TOURNIQUET CUFF) ×1 IMPLANT
DRAPE ORTHO SPLIT 77X108 STRL (DRAPES) ×4
DRAPE SURG ORHT 6 SPLT 77X108 (DRAPES) ×2 IMPLANT
DRAPE U-SHAPE 47X51 STRL (DRAPES) ×3 IMPLANT
DRSG MEPILEX BORDER 4X12 (GAUZE/BANDAGES/DRESSINGS) ×3 IMPLANT
DRSG PAD ABDOMINAL 8X10 ST (GAUZE/BANDAGES/DRESSINGS) ×3 IMPLANT
DURAPREP 26ML APPLICATOR (WOUND CARE) ×6 IMPLANT
ELECT REM PT RETURN 9FT ADLT (ELECTROSURGICAL) ×3
ELECTRODE REM PT RTRN 9FT ADLT (ELECTROSURGICAL) ×1 IMPLANT
EVACUATOR 1/8 PVC DRAIN (DRAIN) IMPLANT
FACESHIELD WRAPAROUND (MASK) ×6 IMPLANT
GAUZE SPONGE 4X4 12PLY STRL (GAUZE/BANDAGES/DRESSINGS) ×3 IMPLANT
GAUZE XEROFORM 5X9 LF (GAUZE/BANDAGES/DRESSINGS) ×3 IMPLANT
GLOVE BIOGEL PI IND STRL 8 (GLOVE) ×2 IMPLANT
GLOVE BIOGEL PI INDICATOR 8 (GLOVE) ×4
GLOVE ORTHO TXT STRL SZ7.5 (GLOVE) ×6 IMPLANT
GOWN STRL REUS W/ TWL LRG LVL3 (GOWN DISPOSABLE) ×1 IMPLANT
GOWN STRL REUS W/ TWL XL LVL3 (GOWN DISPOSABLE) ×1 IMPLANT
GOWN STRL REUS W/TWL 2XL LVL3 (GOWN DISPOSABLE) ×3 IMPLANT
GOWN STRL REUS W/TWL LRG LVL3 (GOWN DISPOSABLE) ×2
GOWN STRL REUS W/TWL XL LVL3 (GOWN DISPOSABLE) ×2
HANDPIECE INTERPULSE COAX TIP (DISPOSABLE) ×2
IMMOBILIZER KNEE 22 UNIV (SOFTGOODS) ×3 IMPLANT
KIT BASIN OR (CUSTOM PROCEDURE TRAY) ×3 IMPLANT
KIT TURNOVER KIT B (KITS) ×3 IMPLANT
MANIFOLD NEPTUNE II (INSTRUMENTS) ×3 IMPLANT
MARKER SKIN DUAL TIP RULER LAB (MISCELLANEOUS) ×3 IMPLANT
NEEDLE 18GX1X1/2 (RX/OR ONLY) (NEEDLE) ×3 IMPLANT
NEEDLE HYPO 25GX1X1/2 BEV (NEEDLE) ×3 IMPLANT
NS IRRIG 1000ML POUR BTL (IV SOLUTION) ×3 IMPLANT
PACK TOTAL JOINT (CUSTOM PROCEDURE TRAY) ×3 IMPLANT
PAD ARMBOARD 7.5X6 YLW CONV (MISCELLANEOUS) ×6 IMPLANT
PAD CAST 4YDX4 CTTN HI CHSV (CAST SUPPLIES) ×1 IMPLANT
PADDING CAST COTTON 4X4 STRL (CAST SUPPLIES) ×2
PADDING CAST COTTON 6X4 STRL (CAST SUPPLIES) ×3 IMPLANT
PATELLA MEDIAL ATTUN 35MM KNEE (Knees) ×3 IMPLANT
SET HNDPC FAN SPRY TIP SCT (DISPOSABLE) ×1 IMPLANT
STAPLER VISISTAT 35W (STAPLE) IMPLANT
SUCTION FRAZIER HANDLE 10FR (MISCELLANEOUS) ×2
SUCTION TUBE FRAZIER 10FR DISP (MISCELLANEOUS) ×1 IMPLANT
SUT VIC AB 0 CT1 27 (SUTURE) ×2
SUT VIC AB 0 CT1 27XBRD ANBCTR (SUTURE) ×1 IMPLANT
SUT VIC AB 1 CTX 36 (SUTURE) ×4
SUT VIC AB 1 CTX36XBRD ANBCTR (SUTURE) ×2 IMPLANT
SUT VIC AB 2-0 CT1 27 (SUTURE) ×4
SUT VIC AB 2-0 CT1 TAPERPNT 27 (SUTURE) ×2 IMPLANT
SUT VIC AB 3-0 X1 27 (SUTURE) ×3 IMPLANT
SYR 50ML LL SCALE MARK (SYRINGE) ×3 IMPLANT
SYR CONTROL 10ML LL (SYRINGE) ×3 IMPLANT
TIBIAL BASE ROT PLAT SZ 5 KNEE (Knees) ×3 IMPLANT
TOWEL GREEN STERILE (TOWEL DISPOSABLE) ×3 IMPLANT
TOWEL GREEN STERILE FF (TOWEL DISPOSABLE) ×3 IMPLANT
TRAY CATH 16FR W/PLASTIC CATH (SET/KITS/TRAYS/PACK) IMPLANT

## 2019-12-25 NOTE — Anesthesia Preprocedure Evaluation (Addendum)
Anesthesia Evaluation  Patient identified by MRN, date of birth, ID band Patient awake    Reviewed: Allergy & Precautions, H&P , NPO status , Patient's Chart, lab work & pertinent test results  History of Anesthesia Complications (+) PONV  Airway Mallampati: III  TM Distance: >3 FB Neck ROM: Full    Dental no notable dental hx. (+) Edentulous Upper, Dental Advisory Given   Pulmonary neg pulmonary ROS,    Pulmonary exam normal breath sounds clear to auscultation       Cardiovascular negative cardio ROS   Rhythm:Regular Rate:Normal     Neuro/Psych  Headaches, Anxiety Depression    GI/Hepatic negative GI ROS, Neg liver ROS,   Endo/Other  Morbid obesity  Renal/GU negative Renal ROS  negative genitourinary   Musculoskeletal  (+) Arthritis , Osteoarthritis,  Fibromyalgia -  Abdominal   Peds  Hematology  (+) Blood dyscrasia, anemia ,   Anesthesia Other Findings   Reproductive/Obstetrics negative OB ROS                            Anesthesia Physical Anesthesia Plan  ASA: II  Anesthesia Plan: General   Post-op Pain Management:  Regional for Post-op pain   Induction: Intravenous  PONV Risk Score and Plan: 4 or greater and Ondansetron, Dexamethasone and Midazolam  Airway Management Planned: Oral ETT  Additional Equipment:   Intra-op Plan:   Post-operative Plan: Extubation in OR  Informed Consent: I have reviewed the patients History and Physical, chart, labs and discussed the procedure including the risks, benefits and alternatives for the proposed anesthesia with the patient or authorized representative who has indicated his/her understanding and acceptance.     Dental advisory given  Plan Discussed with: CRNA  Anesthesia Plan Comments:        Anesthesia Quick Evaluation

## 2019-12-25 NOTE — Op Note (Signed)
Preop diagnosis: Left knee primary osteoarthritis  Postop diagnosis: Same  Procedure: Left total knee arthroplasty  Surgeon: Annell Greening, MD  Anesthesia: Abductor block plus general plus Exparel and Marcaine 20+20 = 40.  Assistant: Zonia Kief, PA-C medically necessary and present with entire procedure  Tourniquet time 50 minutes x 350  EBL less than 200 cc  Procedure: After induction of anesthesia proximal thigh tourniquet heel bump lateral post preoperative Ancef prophylaxis and TXA given by anesthesia IV timeout procedure was completed usual sheets drapes sterile skin marker Betadine Steri-Drape was applied.  Leg was wrapped in Esmarch tourniquet inflated.  Midline incision was made medial parapatellar incision.  Patella was resected 10 mm intramedullary hole drilled in the femur 10 mm resected off the distal femur size for 3 and then resected on the tibia.  5 mm spacer block was tight we took an additional 2 mm off the femur to off the tibia and spacer block allowed full extension good collateral balance in flexion extension.  Chamfer cuts box cut was made on the femur.  Femur was a size 3 standard.  Tibia was a size 5.  Trials were inserted all meniscal remnants were resected patient had tricompartmental degenerative arthritis.  Pulse lavage vacuum mixing of the cement cementing the tibia followed by femur placement of the permanent poly and then patella was cemented.  Lug nuts have been drilled in the femur as well as for the patella and all excessive cement was removed.  TXA have been given and Exparel and Marcaine was injected with the cement was setting up.  Cement was hardened 15 minutes hemostasis obtained after deflating the tourniquet.  Standard interrupted closure #1 Vicryl 2-0 Vicryl subtendinous tissue skin staple closure postop dressing and knee immobilizer.  Instrument count needle count was correct.

## 2019-12-25 NOTE — Anesthesia Procedure Notes (Signed)
Anesthesia Regional Block: Adductor canal block   Pre-Anesthetic Checklist: ,, timeout performed, Correct Patient, Correct Site, Correct Laterality, Correct Procedure, Correct Position, site marked, Risks and benefits discussed, pre-op evaluation,  At surgeon's request and post-op pain management  Laterality: Left  Prep: Maximum Sterile Barrier Precautions used, chloraprep       Needles:  Injection technique: Single-shot  Needle Type: Echogenic Stimulator Needle     Needle Length: 9cm  Needle Gauge: 21     Additional Needles:   Procedures:,,,, ultrasound used (permanent image in chart),,,,  Narrative:  Start time: 12/25/2019 11:56 AM End time: 12/25/2019 12:06 PM Injection made incrementally with aspirations every 5 mL.  Performed by: Personally  Anesthesiologist: Gaynelle Adu, MD  Additional Notes: 2% Lidocaine skin wheel.

## 2019-12-25 NOTE — Progress Notes (Signed)
Orthopedic Tech Progress Note Patient Details:  Laura Solis 1950-09-23 [id number]  CPM Left Knee CPM Left Knee: On Left Knee Flexion (Degrees): 65 Left Knee Extension (Degrees): 0 Additional Comments: patient could not tolerate CPM past 65 degrees flexion  Post Interventions Patient Tolerated: Fair Instructions Provided: Adjustment of device, Care of device  Feiga Nadel P Harle Stanford 12/25/2019, 4:34 PM

## 2019-12-25 NOTE — Anesthesia Procedure Notes (Signed)
Procedure Name: Intubation Date/Time: 12/25/2019 1:05 PM Performed by: Shary Decamp, CRNA Pre-anesthesia Checklist: Patient identified, Emergency Drugs available, Suction available and Patient being monitored Patient Re-evaluated:Patient Re-evaluated prior to induction Oxygen Delivery Method: Circle system utilized Preoxygenation: Pre-oxygenation with 100% oxygen Induction Type: IV induction Ventilation: Mask ventilation without difficulty and Oral airway inserted - appropriate to patient size Laryngoscope Size: Hyacinth Meeker and 2 Grade View: Grade I Tube type: Oral Tube size: 7.5 mm Number of attempts: 1 Airway Equipment and Method: Stylet and Oral airway Placement Confirmation: ETT inserted through vocal cords under direct vision,  positive ETCO2 and breath sounds checked- equal and bilateral Secured at: 22 cm Tube secured with: Tape Dental Injury: Teeth and Oropharynx as per pre-operative assessment

## 2019-12-25 NOTE — Interval H&P Note (Signed)
History and Physical Interval Note:  12/25/2019 12:51 PM  Laura Solis  has presented today for surgery, with the diagnosis of left knee osteoarthritis.  The various methods of treatment have been discussed with the patient and family. After consideration of risks, benefits and other options for treatment, the patient has consented to  Procedure(s): LEFT TOTAL KNEE ARTHROPLASTY (Left) as a surgical intervention.  The patient's history has been reviewed, patient examined, no change in status, stable for surgery.  I have reviewed the patient's chart and labs.  Questions were answered to the patient's satisfaction.     Eldred Manges

## 2019-12-25 NOTE — Transfer of Care (Addendum)
Immediate Anesthesia Transfer of Care Note  Patient: Laura Solis  Procedure(s) Performed: LEFT TOTAL KNEE ARTHROPLASTY (Left Knee)  Patient Location: PACU  Anesthesia Type:General  Level of Consciousness: awake, alert , oriented and patient cooperative  Airway & Oxygen Therapy: Patient Spontanous Breathing and Patient connected to face mask oxygen  Post-op Assessment: Report given to RN and Post -op Vital signs reviewed and stable  Post vital signs: Reviewed and stable  Last Vitals:  Vitals Value Taken Time  BP 176/85 12/25/19 1455  Temp 37.1 C 12/25/19 1454  Pulse 101 12/25/19 1501  Resp 20 12/25/19 1501  SpO2 88 % 12/25/19 1501  Vitals shown include unvalidated device data.  Last Pain:  Vitals:   12/25/19 1058  TempSrc:   PainSc: 0-No pain         Complications: No apparent anesthesia complications

## 2019-12-26 ENCOUNTER — Encounter (HOSPITAL_COMMUNITY): Payer: Self-pay | Admitting: Orthopaedic Surgery

## 2019-12-26 DIAGNOSIS — Z6837 Body mass index (BMI) 37.0-37.9, adult: Secondary | ICD-10-CM | POA: Diagnosis not present

## 2019-12-26 DIAGNOSIS — F418 Other specified anxiety disorders: Secondary | ICD-10-CM | POA: Diagnosis present

## 2019-12-26 DIAGNOSIS — Z20822 Contact with and (suspected) exposure to covid-19: Secondary | ICD-10-CM | POA: Diagnosis present

## 2019-12-26 DIAGNOSIS — M797 Fibromyalgia: Secondary | ICD-10-CM | POA: Diagnosis present

## 2019-12-26 DIAGNOSIS — Z96659 Presence of unspecified artificial knee joint: Secondary | ICD-10-CM

## 2019-12-26 DIAGNOSIS — M1712 Unilateral primary osteoarthritis, left knee: Secondary | ICD-10-CM | POA: Diagnosis present

## 2019-12-26 DIAGNOSIS — G2581 Restless legs syndrome: Secondary | ICD-10-CM | POA: Diagnosis present

## 2019-12-26 DIAGNOSIS — F909 Attention-deficit hyperactivity disorder, unspecified type: Secondary | ICD-10-CM | POA: Diagnosis present

## 2019-12-26 DIAGNOSIS — Z981 Arthrodesis status: Secondary | ICD-10-CM | POA: Diagnosis not present

## 2019-12-26 DIAGNOSIS — Z96651 Presence of right artificial knee joint: Secondary | ICD-10-CM | POA: Diagnosis present

## 2019-12-26 LAB — CBC
HCT: 41.1 % (ref 36.0–46.0)
Hemoglobin: 13.1 g/dL (ref 12.0–15.0)
MCH: 31.7 pg (ref 26.0–34.0)
MCHC: 31.9 g/dL (ref 30.0–36.0)
MCV: 99.5 fL (ref 80.0–100.0)
Platelets: 233 10*3/uL (ref 150–400)
RBC: 4.13 MIL/uL (ref 3.87–5.11)
RDW: 13 % (ref 11.5–15.5)
WBC: 10.8 10*3/uL — ABNORMAL HIGH (ref 4.0–10.5)
nRBC: 0 % (ref 0.0–0.2)

## 2019-12-26 LAB — BASIC METABOLIC PANEL
Anion gap: 9 (ref 5–15)
BUN: 10 mg/dL (ref 8–23)
CO2: 29 mmol/L (ref 22–32)
Calcium: 9 mg/dL (ref 8.9–10.3)
Chloride: 98 mmol/L (ref 98–111)
Creatinine, Ser: 0.9 mg/dL (ref 0.44–1.00)
GFR calc Af Amer: >60 mL/min (ref >60)
GFR calc non Af Amer: >60 mL/min (ref >60)
Glucose, Bld: 243 mg/dL — ABNORMAL HIGH (ref 70–99)
Potassium: 4.3 mmol/L (ref 3.5–5.1)
Sodium: 136 mmol/L (ref 135–145)

## 2019-12-26 LAB — GLUCOSE, CAPILLARY: Glucose-Capillary: 208 mg/dL — ABNORMAL HIGH (ref 70–99)

## 2019-12-26 NOTE — Progress Notes (Signed)
Physical Therapy Treatment Patient Details Name: Laura Solis MRN: [id number] DOB: [birth year] Today's Date: 12/26/2019    History of Present Illness 69 y.o. female s/p L TKA 12/25/19. PMH OA, R TKA 2014, rotator cuff tear, HA, chronic LBP, anxiety, ADHD, fibromyalgia, lumbar fusion 2019.    PT Comments    Pt received in recliner chair, with reports of feeling sleepy. BP assessed and found to be hypotensive(see below). Pt able to transfer to/from recliner to/from bsc with RW and min guard assistance for safety. Reeducated on WBAT precautions and proper positioning for LE, pt verbalized understanding. Pt taught LE therex to complete while resting in the room, pt demonstrated exercises and verbalized understanding. Pt remains appropriate for physical therapy while in acute care setting until d/c to next level of care.    BP pre session 80/56  BP standing 68/57 BP seated on BSC 81/51 BP after transfer back to recliner 87/51 BP after session 95/61     Spo2 >88% RA during session    Follow Up Recommendations  Home health PT;Follow surgeon's recommendation for DC plan and follow-up therapies     Equipment Recommendations  None recommended by PT    Recommendations for Other Services       Precautions / Restrictions Precautions Precautions: Fall Restrictions Weight Bearing Restrictions: Yes LLE Weight Bearing: Weight bearing as tolerated    Mobility  Bed Mobility               General bed mobility comments: Pt received in recliner chair  Transfers Overall transfer level: Needs assistance Equipment used: Rolling walker (2 wheeled) Transfers: Sit to/from [facility] Sit to Stand: Min guard Stand pivot transfers: Min guard       General transfer comment: Pt required min guard for safety, due to feeling sleep and low BP. Stand pivot to bsc and back to the recliner.  Ambulation/Gait Ambulation/Gait assistance: Min guard Gait Distance (Feet): 4  Feet Assistive device: Rolling walker (2 wheeled) Gait Pattern/deviations: Step-to pattern;Decreased stride length;Decreased weight shift to left;Wide base of support Gait velocity: decreased   General Gait Details: Pt amb to bed side commode with stand pivot, pt amb to recliner   Stairs             Wheelchair Mobility    Modified Rankin (Stroke Patients Only)       Balance Overall balance assessment: Needs assistance Sitting-balance support: Bilateral upper extremity supported;Feet supported Sitting balance-Leahy Scale: Good       Standing balance-Leahy Scale: Poor Standing balance comment: Pt required RW bilateral UE support for stand and amb.                            Cognition Arousal/Alertness: Awake/alert Behavior During Therapy: WFL for tasks assessed/performed Overall Cognitive Status: Within Functional Limits for tasks assessed                                 General Comments: Pt reports she has felt "sleepy all day"      Exercises Total Joint Exercises Ankle Circles/Pumps: AROM;10 reps;Both;Seated Quad Sets: AROM;Left;10 reps(long sitting in recliner) Hip ABduction/ADduction: AROM;5 reps;Left(long sitting in recliner) Long Arc Quad: AROM;Seated;5 reps;Left    General Comments General comments (skin integrity, edema, etc.): Pt observed slight edema in LLE with no visable drainage through wound dressing. Pt reported feeling sleepy during session. Pt BP hypotensive throughout session  becoming orthostatic with stand. Kept session in room due to low BP and focused on LE therex.      Pertinent Vitals/Pain Pain Assessment: 0-10 Pain Score: 4  Pain Location: L LE Pain Descriptors / Indicators: Grimacing;Discomfort Pain Intervention(s): Monitored during session;Limited activity within patient's tolerance    Home Living                      Prior Function            PT Goals (current goals can now be found in the  care plan section) Acute Rehab PT Goals Patient Stated Goal: Go home PT Goal Formulation: With patient Time For Goal Achievement: 01/09/20 Potential to Achieve Goals: Good Progress towards PT goals: Progressing toward goals    Frequency    7X/week      PT Plan Current plan remains appropriate    Co-evaluation              AM-PAC PT "6 Clicks" Mobility   Outcome Measure  Help needed turning from your back to your side while in a flat bed without using bedrails?: None Help needed moving from lying on your back to sitting on the side of a flat bed without using bedrails?: A Little Help needed moving to and from a bed to a chair (including a wheelchair)?: A Little Help needed standing up from a chair using your arms (e.g., wheelchair or bedside chair)?: A Little Help needed to walk in hospital room?: A Little Help needed climbing 3-5 steps with a railing? : A Little 6 Click Score: 19    End of Session Equipment Utilized During Treatment: Gait belt Activity Tolerance: Treatment limited secondary to medical complications (Comment)(hypotensive) Patient left: in chair;with call bell/phone within reach;with chair alarm set Nurse Communication: Mobility status;Other (comment)(vital signs) PT Visit Diagnosis: Unsteadiness on feet (R26.81);Muscle weakness (generalized) (M62.81);Pain Pain - Right/Left: Left Pain - part of body: Knee     Time: 1322-1350 PT Time Calculation (min) (ACUTE ONLY): 28 min  Charges:  $Therapeutic Exercise: 8-22 mins $Therapeutic Activity: 8-22 mins                     [employer] SPT 12/26/2019    Sanjuana Letters 12/26/2019, 2:33 PM

## 2019-12-26 NOTE — Progress Notes (Signed)
Was light headed with therapy.  She thinks due to muscle relaxant.  Likely home tomorrow. Did a little better this afternoon session.

## 2019-12-26 NOTE — Plan of Care (Signed)
  Problem: Education: Goal: Knowledge of General Education information will improve Description: Including pain rating scale, medication(s)/side effects and non-pharmacologic comfort measures Outcome: Progressing   Problem: Pain Managment: Goal: General experience of comfort will improve Outcome: Progressing   Problem: Safety: Goal: Ability to remain free from injury will improve Outcome: Progressing   

## 2019-12-26 NOTE — Evaluation (Signed)
Physical Therapy Evaluation Patient Details Name: Laura Solis MRN: [id number] DOB: May 13, 1951 Today's Date: 12/26/2019   History of Present Illness  69 y.o. female s/p L TKA 12/25/19. PMH OA, R TKA 2014, rotator cuff tear, HA, chronic LBP, anxiety, ADHD, fibromyalgia, lumbar fusion 2019.  Clinical Impression  Pt presents with an overall decrease in functional mobility, generalized pain, decreased balance and pain secondary to above. 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 Educ on WBAT precautions, which pt verbalized and maintained during session. Educated pt on therex to be completing, at rest to improve strength, ROM. Today, pt able to complete bed mobility min(A), sit to stand, transfer and amb min guard with RW. Discussed d/c recommendations for home with home health physical therapy, and pt agreed. Pt would benefit from continued acute PT services to maximize functional mobility and independence prior to d/c to next venue of care.    BP EOB 111/63 BP in recliner at end of session 111/66   Follow Up Recommendations Home health PT;Follow surgeon's recommendation for DC plan and follow-up therapies    Equipment Recommendations  None recommended by PT    Recommendations for Other Services       Precautions / Restrictions Precautions Precautions: Fall Restrictions Weight Bearing Restrictions: Yes LLE Weight Bearing: Weight bearing as tolerated      Mobility  Bed Mobility Overal bed mobility: Needs Assistance Bed Mobility: Supine to Sit     Supine to sit: Min assist;HOB elevated     General bed mobility comments: Pt required min(A) to scoot EOB and elevate trunk  Transfers Overall transfer level: Needs assistance Equipment used: Rolling walker (2 wheeled) Transfers: Sit to/from Stand Sit to Stand: Min guard         General transfer comment: Pt required min guard for safety, due to feeling dizzy and first time OOB since  surgery  Ambulation/Gait Ambulation/Gait assistance: Min guard Gait Distance (Feet): 8 Feet Assistive device: Rolling walker (2 wheeled) Gait Pattern/deviations: Step-to pattern;Decreased stride length;Decreased weight shift to left;Wide base of support Gait velocity: decreased   General Gait Details: Pt amb to bed side commode with stand pivot, pt amb to recliner  Stairs            Wheelchair Mobility    Modified Rankin (Stroke Patients Only)       Balance Overall balance assessment: Needs assistance Sitting-balance support: Bilateral upper extremity supported;Feet supported Sitting balance-Leahy Scale: Good       Standing balance-Leahy Scale: Poor Standing balance comment: Pt required RW bilateral UE support for stand and amb.                             Pertinent Vitals/Pain Pain Assessment: Faces Faces Pain Scale: Hurts a little bit Pain Location: L LE Pain Descriptors / Indicators: Grimacing;Discomfort Pain Intervention(s): Limited activity within patient's tolerance;Monitored during session;Premedicated before session;Repositioned    Home Living Family/patient expects to be discharged to:: Private residence Living Arrangements: Alone Available Help at Discharge: Family Type of Home: House Home Access: Stairs to enter Entrance Stairs-Rails: Can reach both Entrance Stairs-Number of Steps: 4 Home Layout: One level Home Equipment: Environmental consultant - 2 wheels;Bedside commode;Shower seat Additional Comments: Pt reports she would like to go home and daughter can assist    Prior Function Level of Independence: Independent         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     Hand Dominance  Extremity/Trunk Assessment   Upper Extremity Assessment Upper Extremity Assessment: Overall WFL for tasks assessed    Lower Extremity Assessment Lower Extremity Assessment: Overall WFL for tasks assessed;LLE deficits/detail;RLE deficits/detail RLE  Sensation: WNL LLE Deficits / Details: Able to complete half motion of a SLR x3 throughout session. LLE: Unable to fully assess due to pain LLE Sensation: WNL       Communication   Communication: No difficulties  Cognition Arousal/Alertness: Awake/alert Behavior During Therapy: WFL for tasks assessed/performed Overall Cognitive Status: Within Functional Limits for tasks assessed                                        General Comments General comments (skin integrity, edema, etc.): Observed slight edema in LLE and no visable drainage through wound dressing. Pt reported feeling dizzy during session, VSS. Pt >88% RA.    Exercises     Assessment/Plan    PT Assessment Patient needs continued PT services  PT Problem List Decreased strength;Decreased mobility;Decreased range of motion;Decreased activity tolerance;Cardiopulmonary status limiting activity;Decreased balance;Decreased knowledge of use of DME;Pain       PT Treatment Interventions DME instruction;Therapeutic activities;Gait training;Therapeutic exercise;Patient/family education;Stair training;Balance training;Functional mobility training    PT Goals (Current goals can be found in the Care Plan section)  Acute Rehab PT Goals Patient Stated Goal: Go home PT Goal Formulation: With patient Time For Goal Achievement: 01/09/20 Potential to Achieve Goals: Good    Frequency 7X/week   Barriers to discharge   Pt reports she does not want to go to SNF and would like to go home with daughter assistance    Co-evaluation               AM-PAC PT "6 Clicks" Mobility  Outcome Measure Help needed turning from your back to your side while in a flat bed without using bedrails?: None Help needed moving from lying on your back to sitting on the side of a flat bed without using bedrails?: A Little Help needed moving to and from a bed to a chair (including a wheelchair)?: A Little Help needed standing up from a  chair using your arms (e.g., wheelchair or bedside chair)?: A Little Help needed to walk in hospital room?: A Little Help needed climbing 3-5 steps with a railing? : A Little 6 Click Score: 19    End of Session Equipment Utilized During Treatment: Gait belt Activity Tolerance: Patient tolerated treatment well Patient left: in chair;with call bell/phone within reach;with chair alarm set;with nursing/sitter in room Nurse Communication: Mobility status PT Visit Diagnosis: Unsteadiness on feet (R26.81);Muscle weakness (generalized) (M62.81);Pain Pain - Right/Left: Left Pain - part of body: Knee    Time: 6269-4854 PT Time Calculation (min) (ACUTE ONLY): 34 min   Charges:   PT Evaluation $PT Eval Low Complexity: 1 Low PT Treatments $Therapeutic Activity: 8-22 mins        [employer] SPT 12/26/2019   Sanjuana Letters 12/26/2019, 10:01 AM

## 2019-12-26 NOTE — Progress Notes (Signed)
Occupational Therapy Evaluation  Pt admitted with the below diagnosis and demonstrates the below listed deficits.  She requires set up to mod A for ADLs.  Evaluation was limited by lethargy.  She reports she lives alone and daughter will assist her at discharge.  Will follow acutely.    12/26/19 1500  OT Visit Information  Last OT Received On 12/26/19  History of Present Illness 69 y.o. female s/p L TKA 12/25/19. PMH OA, R TKA 2014, rotator cuff tear, HA, chronic LBP, anxiety, ADHD, fibromyalgia, lumbar fusion 2019.  Precautions  Precautions Fall;Knee  Precaution Booklet Issued No  Restrictions  Weight Bearing Restrictions Yes  LLE Weight Bearing WBAT  Home Living  Family/patient expects to be discharged to: Private residence  Living Arrangements Alone  Available Help at Discharge Family  Type of Home House  Home Access Stairs to enter  Entrance Stairs-Number of Steps 4  Entrance Stairs-Rails Can reach both  Home Layout One level  Bathroom Shower/Tub Walk-in shower  Bathroom Toilet Handicapped height  Bathroom Accessibility Yes  How Accessible Accessible via walker  Home Equipment Walker - 2 wheels;BSC;Shower seat  Additional Comments Pt reports he daughter will stay with her for several days after discharge and can assist   Prior Function  Level of Independence Independent  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  Communication  Communication No difficulties  Pain Assessment  Pain Assessment Faces  Faces Pain Scale 4  Pain Location L LE  Pain Descriptors / Indicators Grimacing;Discomfort  Pain Intervention(s) Monitored during session;Repositioned  Cognition  Arousal/Alertness Lethargic  Behavior During Therapy Flat affect  Overall Cognitive Status Difficult to assess  General Comments Pt lethargic and will arouse when sitting EOC and engaged in an activity   Difficult to assess due to Level of arousal  Upper Extremity Assessment  Upper Extremity Assessment Overall WFL  for tasks assessed  Lower Extremity Assessment  Lower Extremity Assessment Defer to PT evaluation  Cervical / Trunk Assessment  Cervical / Trunk Assessment Normal  ADL  Overall ADL's  Needs assistance/impaired  Eating/Feeding Independent  Grooming Wash/dry hands;Wash/dry face;Oral care;Brushing hair;Set up;Sitting  Upper Body Bathing Set up;Supervision/ safety;Sitting  Lower Body Bathing Sit to/from stand;Moderate assistance  Upper Body Dressing  Set up;Sitting  Lower Body Dressing Moderate assistance;Sit to/from stand  Lower Body Dressing Details (indicate cue type and reason) difficulty accessing feet   Toilet Transfer Minimal assistance;Stand-pivot;BSC;RW  Toileting- Clothing Manipulation and Hygiene Moderate assistance;Sit to/from stand  Functional mobility during ADLs Minimal assistance;Rolling walker  General ADL Comments limited by fatigue   Bed Mobility  General bed mobility comments in chair   Transfers  General transfer comment Pt deferred due to fatigue   Balance  Overall balance assessment Needs assistance  Sitting-balance support Feet supported  Sitting balance-Leahy Scale Good  OT Assessment  OT Recommendation/Assessment Patient needs continued OT Services  OT Visit Diagnosis Unsteadiness on feet (R26.81);Pain  Pain - Right/Left Left  Pain - part of body Knee  OT Problem List Decreased strength;Decreased activity tolerance;Impaired balance (sitting and/or standing);Decreased safety awareness;Decreased knowledge of use of DME or AE;Impaired UE functional use;Pain  OT Plan  OT Frequency (ACUTE ONLY) Min 2X/week  OT Treatment/Interventions (ACUTE ONLY) Self-care/ADL training;DME and/or AE instruction;Therapeutic activities;Patient/family education;Balance training  AM-PAC OT "6 Clicks" Daily Activity Outcome Measure (Version 2)  Help from another person eating meals? 4  Help from another person taking care of personal grooming? 3  Help from another person toileting,  which includes using toliet, bedpan, or  urinal? 3  Help from another person bathing (including washing, rinsing, drying)? 3  Help from another person to put on and taking off regular upper body clothing? 3  Help from another person to put on and taking off regular lower body clothing? 2  6 Click Score 18  OT Recommendation  Follow Up Recommendations No OT follow up;Supervision/Assistance - 24 hour (initially )  OT Equipment 3 in 1 bedside commode  Individuals Consulted  Consulted and Agree with Results and Recommendations Patient  Acute Rehab OT Goals  Patient Stated Goal Go home  OT Goal Formulation With patient  Time For Goal Achievement 01/02/20  Potential to Achieve Goals Good  OT Time Calculation  OT Start Time (ACUTE ONLY) 1132  OT Stop Time (ACUTE ONLY) 1142  OT Time Calculation (min) 10 min  OT General Charges  $OT Visit 1 Visit  OT Evaluation  $OT Eval Moderate Complexity 1 Mod  Written Expression  Dominant Hand Right  Nilsa Nutting., OTR/L Acute Rehabilitation Services Pager [phone] Office [phone]

## 2019-12-26 NOTE — Anesthesia Postprocedure Evaluation (Signed)
Anesthesia Post Note  Patient: Laura Solis  Procedure(s) Performed: LEFT TOTAL KNEE ARTHROPLASTY (Left Knee)     Patient location during evaluation: PACU Anesthesia Type: General and Regional Level of consciousness: awake and alert Pain management: pain level controlled Vital Signs Assessment: post-procedure vital signs reviewed and stable Respiratory status: spontaneous breathing, nonlabored ventilation, respiratory function stable and patient connected to nasal cannula oxygen Cardiovascular status: blood pressure returned to baseline and stable Postop Assessment: no apparent nausea or vomiting Anesthetic complications: no    Last Vitals:  Vitals:   12/26/19 0402 12/26/19 0737  BP: (!) 100/50 (!) 90/48  Pulse: 88 88  Resp: 16 15  Temp: 36.6 C 36.8 C  SpO2: 96% 94%    Last Pain:  Vitals:   12/26/19 0800  TempSrc:   PainSc: Asleep                 Deanta Mincey,W. EDMOND

## 2019-12-26 NOTE — Progress Notes (Signed)
   Subjective: 1 Day Post-Op Procedure(s) (LRB): LEFT TOTAL KNEE ARTHROPLASTY (Left) Patient reports pain as mild.    Objective: Vital signs in last 24 hours: Temp:  [97.9 F (36.6 C)-98.7 F (37.1 C)] 98.2 F (36.8 C) (05/27 0737) Pulse Rate:  [69-103] 88 (05/27 0737) Resp:  [5-23] 15 (05/27 0737) BP: (90-185)/(43-93) 90/48 (05/27 0737) SpO2:  [31 %-100 %] 94 % (05/27 0737) Weight:  [104.3 kg] 104.3 kg (05/26 1040)  Intake/Output from previous day: 05/26 0701 - 05/27 0700 In: 1250 [I.V.:1250] Out: 330 [Urine:300; Blood:30] Intake/Output this shift: No intake/output data recorded.  Recent Labs    12/26/19 0501  HGB 13.1   Recent Labs    12/26/19 0501  WBC 10.8*  RBC 4.13  HCT 41.1  PLT 233   Recent Labs    12/26/19 0501  NA 136  K 4.3  CL 98  CO2 29  BUN 10  CREATININE 0.90  GLUCOSE 243*  CALCIUM 9.0   No results for input(s): LABPT, INR in the last 72 hours.  Neurologically intact DG Knee Left Port  Result Date: 12/25/2019 CLINICAL DATA:  Postoperative evaluation. EXAM: PORTABLE LEFT KNEE - 1-2 VIEW COMPARISON:  None. FINDINGS: Patient status post left knee arthroplasty. Hardware appears intact. No acute fracture. Postsurgical changes within the soft tissues. IMPRESSION: Patient status post left knee arthroplasty. Electronically Signed   By: Annia Belt M.D.   On: 12/25/2019 15:39    Assessment/Plan: 1 Day Post-Op Procedure(s) (LRB): LEFT TOTAL KNEE ARTHROPLASTY (Left) Up with therapy  Eldred Manges 12/26/2019, 7:45 AM

## 2019-12-27 ENCOUNTER — Encounter: Payer: Self-pay | Admitting: *Deleted

## 2019-12-27 LAB — CBC
HCT: 39.5 % (ref 36.0–46.0)
Hemoglobin: 12.6 g/dL (ref 12.0–15.0)
MCH: 32.2 pg (ref 26.0–34.0)
MCHC: 31.9 g/dL (ref 30.0–36.0)
MCV: 101 fL — ABNORMAL HIGH (ref 80.0–100.0)
Platelets: 196 10*3/uL (ref 150–400)
RBC: 3.91 MIL/uL (ref 3.87–5.11)
RDW: 13.4 % (ref 11.5–15.5)
WBC: 10.7 10*3/uL — ABNORMAL HIGH (ref 4.0–10.5)
nRBC: 0 % (ref 0.0–0.2)

## 2019-12-27 MED ORDER — ASPIRIN 325 MG PO TABS
325.0000 mg | ORAL_TABLET | Freq: Every day | ORAL | Status: DC
Start: 2019-12-27 — End: 2019-12-27

## 2019-12-27 MED ORDER — METHOCARBAMOL 500 MG PO TABS: 500.0000 mg | ORAL_TABLET | Freq: Three times a day (TID) | ORAL | 0 refills | Status: DC | PRN

## 2019-12-27 MED ORDER — ASPIRIN 325 MG PO TABS
325.0000 mg | ORAL_TABLET | Freq: Every day | ORAL | 0 refills | Status: DC
Start: 2019-12-27 — End: 2020-01-20

## 2019-12-27 MED ORDER — OXYCODONE-ACETAMINOPHEN 5-325 MG PO TABS: 1.0000 | ORAL_TABLET | ORAL | 0 refills | Status: AC | PRN

## 2019-12-27 NOTE — Progress Notes (Signed)
Physical Therapy Treatment Patient Details Name: Laura Solis MRN: [id number] DOB: [birth year] Today's Date: 12/27/2019    History of Present Illness 68 y.o. female s/p L TKA 12/25/19. PMH OA, R TKA 2014, rotator cuff tear, HA, chronic LBP, anxiety, ADHD, fibromyalgia, lumbar fusion 2019.    PT Comments    Pt reports she remains in increased pain due to "different nerve block", expressed frustration at not being able to mobilize as well as she could with her prior R TKA. Pt was able to amb min guard and negotiate 1 step min(A). Pt educated on the importance of having caregivers around to assist her with stair negotiation and safe mobility around her home. Educated on proper guarding technique for caregivers to provide when amb up/down steps. Will continue to follow acutely until d/c to next venue of care.   Follow Up Recommendations  Home health PT;Follow surgeon's recommendation for DC plan and follow-up therapies     Equipment Recommendations  None recommended by PT    Recommendations for Other Services       Precautions / Restrictions Precautions Precautions: Fall;Knee Precaution Booklet Issued: No Restrictions Weight Bearing Restrictions: Yes LLE Weight Bearing: Weight bearing as tolerated    Mobility  Bed Mobility Overal bed mobility: Needs Assistance Bed Mobility: Supine to Sit     Supine to sit: Min guard     General bed mobility comments: received in recliner chair  Transfers Overall transfer level: Needs assistance Equipment used: Rolling walker (2 wheeled) Transfers: Sit to/from Stand Sit to Stand: Min guard Stand pivot transfers: Min guard       General transfer comment: min guard for safety and stability  Ambulation/Gait Ambulation/Gait assistance: Min guard Gait Distance (Feet): 4 Feet Assistive device: Rolling walker (2 wheeled) Gait Pattern/deviations: Step-to pattern;Decreased stride length;Decreased weight shift to left;Wide base of  support;Decreased stance time - left Gait velocity: decreased   General Gait Details: rolled recliner up to stairs, pt amb 4 feet to steps, rolled back to room in recliner   Stairs Stairs: Yes Stairs assistance: Min assist Stair Management: One rail Left Number of Stairs: 1 General stair comments: Negotiated 1 step min(A) up forward, down backward. Educated on several ways to negotiate steps, which LE to lead with.   Wheelchair Mobility    Modified Rankin (Stroke Patients Only)       Balance Overall balance assessment: Needs assistance Sitting-balance support: Feet supported Sitting balance-Leahy Scale: Good     Standing balance support: During functional activity Standing balance-Leahy Scale: Poor Standing balance comment: reliant on UE support                            Cognition Arousal/Alertness: Awake/alert Behavior During Therapy: WFL for tasks assessed/performed Overall Cognitive Status: Within Functional Limits for tasks assessed                                 General Comments: Pt reports she is feeling better today and less fatigued      Exercises      General Comments General comments (skin integrity, edema, etc.): Educated on safe stair negotiation and safe gaurding techniques for the patient to teach caregivers.        Pertinent Vitals/Pain Pain Assessment: Faces Pain Score: 2  Faces Pain Scale: Hurts little more Pain Location: L LE Pain Descriptors / Indicators: Grimacing;Discomfort;Guarding Pain Intervention(s): Limited activity within  patient's tolerance;Monitored during session;Repositioned    Home Living                      Prior Function            PT Goals (current goals can now be found in the care plan section) Acute Rehab PT Goals Patient Stated Goal: Go home PT Goal Formulation: With patient Time For Goal Achievement: 01/09/20 Potential to Achieve Goals: Good Progress towards PT goals:  Progressing toward goals    Frequency    7X/week      PT Plan Current plan remains appropriate    Co-evaluation              AM-PAC PT "6 Clicks" Mobility   Outcome Measure  Help needed turning from your back to your side while in a flat bed without using bedrails?: None Help needed moving from lying on your back to sitting on the side of a flat bed without using bedrails?: A Little Help needed moving to and from a bed to a chair (including a wheelchair)?: A Little Help needed standing up from a chair using your arms (e.g., wheelchair or bedside chair)?: A Little Help needed to walk in hospital room?: A Little Help needed climbing 3-5 steps with a railing? : A Little 6 Click Score: 19    End of Session Equipment Utilized During Treatment: Gait belt Activity Tolerance: Patient limited by pain Patient left: in chair;with call bell/phone within reach;with chair alarm set Nurse Communication: Mobility status PT Visit Diagnosis: Unsteadiness on feet (R26.81);Muscle weakness (generalized) (M62.81);Pain Pain - Right/Left: Left Pain - part of body: Knee     Time: 3267-1245 PT Time Calculation (min) (ACUTE ONLY): 17 min  Charges:  $Gait Training: 8-22 mins                     [employer] SPT 12/27/2019    Sanjuana Letters 12/27/2019, 12:51 PM

## 2019-12-27 NOTE — Progress Notes (Signed)
Subjective: Doing well.  Pain controlled.  Ready to d/c home today.    Objective: Vital signs in last 24 hours: Temp:  [97.8 F (36.6 C)-98.2 F (36.8 C)] 98.2 F (36.8 C) (05/28 0900) Pulse Rate:  [82-90] 85 (05/28 0900) Resp:  [16-17] 17 (05/28 0900) BP: (96-106)/(54-66) 105/57 (05/28 0900) SpO2:  [93 %-95 %] 95 % (05/28 0900)  Intake/Output from previous day: 05/27 0701 - 05/28 0700 In: 400 [P.O.:400] Out: 800 [Urine:800] Intake/Output this shift: No intake/output data recorded.  Recent Labs    12/26/19 0501 12/27/19 0440  HGB 13.1 12.6   Recent Labs    12/26/19 0501 12/27/19 0440  WBC 10.8* 10.7*  RBC 4.13 3.91  HCT 41.1 39.5  PLT 233 196   Recent Labs    12/26/19 0501  NA 136  K 4.3  CL 98  CO2 29  BUN 10  CREATININE 0.90  GLUCOSE 243*  CALCIUM 9.0   No results for input(s): LABPT, INR in the last 72 hours.  Exam: Pleasant female. Alert and oriented, NAD.  Knee wound looks good and staples intact. No drainage or signs of infection.  Calf nontender.     Assessment/Plan: D/c home today.  I advised patient that it would be best if someone can offer 24 hour assistance at home for the first couple of weeks since she does live alone.  I did speak with patient and her daughter who was present and Ms Bellomo will either go to daughter's house with family or daughter will stay at patient's house.  F/u with Dr Ophelia Charter 2 weeks postop     Zonia Kief 12/27/2019, 2:22 PM

## 2019-12-27 NOTE — Plan of Care (Signed)
  Problem: Education: Goal: Knowledge of General Education information will improve Description: Including pain rating scale, medication(s)/side effects and non-pharmacologic comfort measures Outcome: Adequate for Discharge   

## 2019-12-27 NOTE — Progress Notes (Signed)
Discharge instructions addressed; Pt in stable condition;Pt's daughter in room and will be pt.' s ride home.

## 2019-12-27 NOTE — Discharge Instructions (Signed)

## 2019-12-27 NOTE — Progress Notes (Signed)
Physical Therapy Treatment Patient Details Name: Laura Solis MRN: [id number] DOB: Apr 14, 1951 Today's Date: 12/27/2019    History of Present Illness 69 y.o. female s/p L TKA 12/25/19. PMH OA, R TKA 2014, rotator cuff tear, HA, chronic LBP, anxiety, ADHD, fibromyalgia, lumbar fusion 2019.    PT Comments    Pt required min guard for bed mobility, min(A) for sit to stand with RW, min guard for amb 86ft with RW. Pt reports she is in more pain than her R TKA, which has hindered her mobility. Pt reminded of WBAT precautions and pt maintained during session. Confirmed d/c recommendations with pt and she would still like HHPT. Pt was educated on the importance of having a caregiver to assist her for the first several days at home. Will continue to follow acutely until d/c to next venue of care.   Orthostatic BP Vitals  In bed  90/51   Sitting EOB 98/70  Standing  93/59  Sitting after amb 105/57   Spo2 consistently >88% good pleth RA   Follow Up Recommendations  Home health PT;Follow surgeon's recommendation for DC plan and follow-up therapies     Equipment Recommendations  None recommended by PT    Recommendations for Other Services       Precautions / Restrictions Precautions Precautions: Fall;Knee Precaution Booklet Issued: No Restrictions Weight Bearing Restrictions: Yes LLE Weight Bearing: Weight bearing as tolerated    Mobility  Bed Mobility Overal bed mobility: Needs Assistance Bed Mobility: Supine to Sit     Supine to sit: Min guard     General bed mobility comments: required guard for safety.   Transfers Overall transfer level: Needs assistance Equipment used: Rolling walker (2 wheeled) Transfers: Sit to/from Stand Sit to Stand: Min guard        General transfer comment: min guard for safety   Ambulation/Gait Ambulation/Gait assistance: Min guard Gait Distance (Feet): 20 Feet Assistive device: Rolling walker (2 wheeled) Gait Pattern/deviations: Step-to  pattern;Decreased stride length;Decreased weight shift to left;Wide base of support;Decreased stance time - left Gait velocity: decreased   General Gait Details: Pt able to amb from bed to bathroom, bathroom to recliner with min guard for safety due to decreased stability.   Stairs             Wheelchair Mobility    Modified Rankin (Stroke Patients Only)       Balance Overall balance assessment: Needs assistance Sitting-balance support: Feet supported Sitting balance-Leahy Scale: Good     Standing balance support: During functional activity Standing balance-Leahy Scale: Poor Standing balance comment: reliant on UE support                             Cognition Arousal/Alertness: Awake/alert Behavior During Therapy: WFL for tasks assessed/performed Overall Cognitive Status: Within Functional Limits for tasks assessed                                 General Comments: Pt reports she is feeling better today and less fatigued      Exercises      General Comments General comments (skin integrity, edema, etc.): Pt wound dressing had no visible exudate, pt L LE slightly swollen.       Pertinent Vitals/Pain Pain Assessment: Faces Pain Score: 4  Faces Pain Scale: Hurts little more Pain Location: L LE Pain Descriptors / Indicators: Grimacing;Discomfort;Guarding Pain Intervention(s): Monitored  during session;Limited activity within patient's tolerance    Home Living                      Prior Function            PT Goals (current goals can now be found in the care plan section) Acute Rehab PT Goals Patient Stated Goal: Go home PT Goal Formulation: With patient Time For Goal Achievement: 01/09/20 Potential to Achieve Goals: Good Progress towards PT goals: Progressing toward goals    Frequency    7X/week      PT Plan Current plan remains appropriate    Co-evaluation              AM-PAC PT "6 Clicks" Mobility    Outcome Measure  Help needed turning from your back to your side while in a flat bed without using bedrails?: None Help needed moving from lying on your back to sitting on the side of a flat bed without using bedrails?: A Little Help needed moving to and from a bed to a chair (including a wheelchair)?: A Little Help needed standing up from a chair using your arms (e.g., wheelchair or bedside chair)?: A Little Help needed to walk in hospital room?: A Little Help needed climbing 3-5 steps with a railing? : A Lot 6 Click Score: 18    End of Session Equipment Utilized During Treatment: Gait belt Activity Tolerance: Patient limited by pain Patient left: in chair;with call bell/phone within reach;with chair alarm set Nurse Communication: Mobility status;Other (comment)(vital signs) PT Visit Diagnosis: Unsteadiness on feet (R26.81);Muscle weakness (generalized) (M62.81);Pain Pain - Right/Left: Left Pain - part of body: Knee     Time: 1025-8527 PT Time Calculation (min) (ACUTE ONLY): 36 min  Charges:  $Therapeutic Activity: 23-37 mins                     [employer] SPT 12/27/2019    Sanjuana Letters 12/27/2019, 12:00 PM

## 2019-12-27 NOTE — Progress Notes (Signed)
Occupational Therapy Treatment Patient Details Name: Laura Solis MRN: [id number] DOB: 03/24/1951 Today's Date: 12/27/2019    History of present illness 69 y.o. female s/p L TKA 12/25/19. PMH OA, R TKA 2014, rotator cuff tear, HA, chronic LBP, anxiety, ADHD, fibromyalgia, lumbar fusion 2019.   OT comments  Pt significantly improved this date.  He is able to perform ADLs with min guard assist, and reports her daughter will stay with her initially after discharge.  She demonstrates good safety awareness, and reviewed fall risk strategies - she was able to verbalize/demonstrate understanding.  BP 121/60, HR 92; 02 sats 93% on RA.   Follow Up Recommendations  No OT follow up;Supervision/Assistance - 24 hour    Equipment Recommendations  3 in 1 bedside commode    Recommendations for Other Services      Precautions / Restrictions Precautions Precautions: Fall;Knee Precaution Booklet Issued: No Restrictions Weight Bearing Restrictions: Yes LLE Weight Bearing: Weight bearing as tolerated       Mobility Bed Mobility Overal bed mobility: Needs Assistance Bed Mobility: Supine to Sit     Supine to sit: Min guard     General bed mobility comments: up in chair   Transfers Overall transfer level: Needs assistance Equipment used: Rolling walker (2 wheeled) Transfers: Sit to/from [employer] Sit to Stand: Min guard Stand pivot transfers: Min guard       General transfer comment: min guard for safety     Balance Overall balance assessment: Needs assistance Sitting-balance support: Feet supported Sitting balance-Leahy Scale: Good     Standing balance support: During functional activity Standing balance-Leahy Scale: Poor Standing balance comment: reliant on UE support                            ADL either performed or assessed with clinical judgement   ADL Overall ADL's : Needs assistance/impaired Eating/Feeding: Independent   Grooming:  Wash/dry hands;Wash/dry face;Oral care;Brushing hair;Min guard;Standing       Lower Body Bathing: Min guard;Sit to/from stand Lower Body Bathing Details (indicate cue type and reason): Pt reports she has Long handled bath brush/sponge  Upper Body Dressing : Set up;Sitting   Lower Body Dressing: Min guard;Sit to/from stand Lower Body Dressing Details (indicate cue type and reason): able to don/doff socks without assist  Toilet Transfer: Min guard;Ambulation;Comfort height toilet;RW   Toileting- Architect and Hygiene: Min guard;Sit to/from stand       Functional mobility during ADLs: Min guard;Rolling walker General ADL Comments: Pt reports she has multiple reachers at home.  Instructed her to don Lt leg first during LB dressing      Vision       Perception     Praxis      Cognition Arousal/Alertness: Awake/alert Behavior During Therapy: WFL for tasks assessed/performed Overall Cognitive Status: Within Functional Limits for tasks assessed                                 General Comments: Pt reports she is feeling better today and less fatigued        Exercises     Shoulder Instructions       General Comments reviewed strategies to reduce risk of falls, use of walker bag and instructed pt to use regular rolling walker and not rollator at discharge     Pertinent Vitals/ Pain  Pain Assessment: Faces Pain Score: 4  Faces Pain Scale: Hurts little more Pain Location: L LE Pain Descriptors / Indicators: Grimacing;Discomfort;Guarding Pain Intervention(s): Monitored during session;Limited activity within patient's tolerance  Home Living                                          Prior Functioning/Environment              Frequency  Min 2X/week        Progress Toward Goals  OT Goals(current goals can now be found in the care plan section)  Progress towards OT goals: Progressing toward goals  Acute Rehab OT  Goals Patient Stated Goal: Go home  Plan Discharge plan remains appropriate    Co-evaluation                 AM-PAC OT "6 Clicks" Daily Activity     Outcome Measure   Help from another person eating meals?: None Help from another person taking care of personal grooming?: A Little Help from another person toileting, which includes using toliet, bedpan, or urinal?: A Little Help from another person bathing (including washing, rinsing, drying)?: A Little Help from another person to put on and taking off regular upper body clothing?: A Little Help from another person to put on and taking off regular lower body clothing?: A Little 6 Click Score: 19    End of Session Equipment Utilized During Treatment: Rolling walker  OT Visit Diagnosis: Unsteadiness on feet (R26.81);Pain Pain - Right/Left: Left Pain - part of body: Knee   Activity Tolerance Patient tolerated treatment well   Patient Left in chair;with call bell/phone within reach;with chair alarm set   Nurse Communication Mobility status        Time: 0258-5277 OT Time Calculation (min): 32 min  Charges: OT General Charges $OT Visit: 1 Visit OT Treatments $Self Care/Home Management : 23-37 mins  Nilsa Nutting OTR/L Acute Rehabilitation Services Pager [phone] Office [phone]    Lucille Passy M 12/27/2019, 11:32 AM

## 2020-01-07 NOTE — Discharge Summary (Signed)
Patient ID: Laura Solis MRN: [id number] DOB/AGE: 1951-05-20 69 y.o.  Admit date: 12/25/2019 Discharge date: Dec 27, 2019 Admission Diagnoses:  Active Problems:   Arthritis of left knee   S/P total knee arthroplasty   Discharge Diagnoses:  Active Problems:   Arthritis of left knee   S/P total knee arthroplasty  status post Procedure(s): LEFT TOTAL KNEE ARTHROPLASTY  Past Medical History:  Diagnosis Date  . ADHD (attention deficit hyperactivity disorder)   . Anxiety    "from the fibromyalgia"  . Chronic lower back pain   . Fibromyalgia   . Headache    "bad one; monthly" (01/11/2017)  . Osteoarthritis    "all over" (01/11/2017)  . Pneumonia    "I've had it about 3 times" (01/11/2017)  . PONV (postoperative nausea and vomiting)   . Rotator cuff tear    left shoulder    Surgeries: Procedure(s): LEFT TOTAL KNEE ARTHROPLASTY on 12/25/2019   Consultants:   Discharged Condition: Improved  Hospital Course: Laura Solis is an 69 y.o. female who was admitted 12/25/2019 for operative treatment of left knee DJD. Patient failed conservative treatments (please see the history and physical for the specifics) and had severe unremitting pain that affects sleep, daily activities and work/hobbies. After pre-op clearance, the patient was taken to the operating room on 12/25/2019 and underwent  Procedure(s): LEFT TOTAL KNEE ARTHROPLASTY.    Patient was given perioperative antibiotics:  Anti-infectives (From admission, onward)   Start     Dose/Rate Route Frequency Ordered Stop   12/25/19 1045  ceFAZolin (ANCEF) IVPB 2g/100 mL premix  Status:  Discontinued     2 g 200 mL/hr over 30 Minutes Intravenous On call to O.R. 12/25/19 1036 12/25/19 1731   12/25/19 1044  ceFAZolin (ANCEF) 2-4 GM/100ML-% IVPB  Status:  Discontinued    Note to Pharmacy: Laura Solis   : cabinet override      12/25/19 1044 12/25/19 1132       Patient was given sequential compression devices and early  ambulation to prevent DVT.   Patient benefited maximally from hospital stay and there were no complications. At the time of discharge, the patient was urinating/moving their bowels without difficulty, tolerating a regular diet, pain is controlled with oral pain medications and they have been cleared by PT/OT.   Recent vital signs: No data found.   Recent laboratory studies: No results for input(s): WBC, HGB, HCT, PLT, NA, K, CL, CO2, BUN, CREATININE, GLUCOSE, INR, CALCIUM in the last 72 hours.  Invalid input(s): PT, 2   Discharge Medications:   Allergies as of 12/27/2019      Reactions   Other Other (See Comments)   Anesthesia causes nausea.       Medication List    TAKE these medications   ALPRAZolam 0.5 MG tablet Commonly known as: XANAX Take 0.5 mg by mouth at bedtime.   amphetamine-dextroamphetamine 10 MG tablet Commonly known as: ADDERALL Take 10 mg by mouth 2 (two) times daily.   aspirin 325 MG tablet Commonly known as: Bayer Aspirin Take 1 tablet (325 mg total) by mouth daily. Take one a day for 4 wks to decrease blood clot risks   FLUoxetine 20 MG capsule Commonly known as: PROZAC Take 20 mg by mouth at bedtime.   gabapentin 300 MG capsule Commonly known as: NEURONTIN Take 300 mg by mouth at bedtime.   ibuprofen 600 MG tablet Commonly known as: ADVIL Take 600 mg by mouth every 6 (six) hours as needed  for moderate pain.   methocarbamol 500 MG tablet Commonly known as: Robaxin Take 1 tablet (500 mg total) by mouth every 8 (eight) hours as needed for muscle spasms.   multivitamin with minerals tablet Take 1 tablet by mouth daily.   oxyCODONE-acetaminophen 5-325 MG tablet Commonly known as: Percocet Take 1-2 tablets by mouth every 4 (four) hours as needed for severe pain.   pramipexole 1.5 MG tablet Commonly known as: MIRAPEX Take 1.5 mg by mouth at bedtime.       Diagnostic Studies: DG Chest 2 View  Result Date: 12/18/2019 CLINICAL DATA:  Left knee  replacement preop evaluation. EXAM: CHEST - 2 VIEW COMPARISON:  01/17/2018 FINDINGS: Lungs are adequately inflated without consolidation or effusion. Cardiomediastinal silhouette and remainder the exam is unchanged. IMPRESSION: No active cardiopulmonary disease. Electronically Signed   By: Laura Solis M.D.   On: 12/18/2019 14:52   DG Knee Left Port  Result Date: 12/25/2019 CLINICAL DATA:  Postoperative evaluation. EXAM: PORTABLE LEFT KNEE - 1-2 VIEW COMPARISON:  None. FINDINGS: Patient status post left knee arthroplasty. Hardware appears intact. No acute fracture. Postsurgical changes within the soft tissues. IMPRESSION: Patient status post left knee arthroplasty. Electronically Signed   By: Laura Solis M.D.   On: 12/25/2019 15:39      Follow-up Information    Laura Killings, MD Follow up.   Specialty: Orthopedic Surgery Why: follow up with Dr. Lorin Solis in [place] [facility] in one to two weeks.  Contact information: [place] [address] [phone]        [facility] Follow up.   Why: home health services arranged Contact information:  [phone] (fax)          Discharge Plan:  discharge to home  Disposition:     Signed: Benjiman Solis  01/07/2020, 3:48 PM

## 2020-01-16 ENCOUNTER — Ambulatory Visit: Payer: Self-pay

## 2020-01-16 ENCOUNTER — Encounter: Payer: Self-pay | Admitting: Orthopaedic Surgery

## 2020-01-16 ENCOUNTER — Ambulatory Visit (INDEPENDENT_AMBULATORY_CARE_PROVIDER_SITE_OTHER): Payer: Medicare Other | Admitting: Orthopaedic Surgery

## 2020-01-16 ENCOUNTER — Other Ambulatory Visit: Payer: Self-pay

## 2020-01-16 VITALS — Ht 65.5 in | Wt 230.0 lb

## 2020-01-16 DIAGNOSIS — Z96652 Presence of left artificial knee joint: Secondary | ICD-10-CM

## 2020-01-16 NOTE — Progress Notes (Signed)
Post-Op Visit Note   Patient: Laura Solis           Date of Birth: 07-Sep-1950           MRN: [id number] Visit Date: 01/16/2020 PCP: Joaquin Courts, DO   Assessment & Plan: Staples removed Steri-Strips applied.  She has near full extension she is flexing ambulating without a cane or walker at this point.  She is only taking some pain medication at night.  Return in 4 weeks she will start some outpatient therapy in [place].  Chief Complaint:  Chief Complaint  Patient presents with  . Left Knee - Routine Post Op    12/25/2019 Left TKA   Visit Diagnoses:  1. S/P total knee arthroplasty, left     Plan: Recheck 4 weeks.  Transition to outpatient therapy as above.  Follow-Up Instructions: No follow-ups on file.   Orders:  Orders Placed This Encounter  Procedures  . XR Knee 1-2 Views Left   No orders of the defined types were placed in this encounter.   Imaging: No results found.  PMFS History: Patient Active Problem List   Diagnosis Date Noted  . S/P total knee arthroplasty 12/26/2019  . Arthritis of left knee 12/25/2019  . Unilateral primary osteoarthritis, left knee 10/18/2019  . Chronic pain of left knee 04/11/2018  . Bilateral primary osteoarthritis of knee 04/11/2018  . Anemia associated with acute blood loss 01/17/2018    Class: Acute  . 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 S/P lumbar spinal fusion 01/16/2018  . Impingement syndrome of left shoulder 12/14/2017  . S/P lumbar fusion 01/26/2017  . Postoperative urinary retention 01/11/2017  . Lumbar stenosis 01/09/2017  . Restless legs 11/14/2016  . Osteoarthritis 11/14/2016  . 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 Attention deficit hyperactivity disorder 11/14/2016  . Anxiety 11/14/2016  . Adult attention deficit hyperactivity disorder 11/14/2016  . Osteoarthritis of right knee 10/03/2012  . Fibromyalgia syndrome 10/03/2012  . Obesity (BMI  30.0-34.9) 10/03/2012   Past Medical History:  Diagnosis Date  . ADHD (attention deficit hyperactivity disorder)   . Anxiety    "from the fibromyalgia"  . Chronic lower back pain   . Fibromyalgia   . Headache    "bad one; monthly" (01/11/2017)  . Osteoarthritis    "all over" (01/11/2017)  . Pneumonia    "I've had it about 3 times" (01/11/2017)  . PONV (postoperative nausea and vomiting)   . Rotator cuff tear    left shoulder    No family history on file.  Past Surgical History:  Procedure Laterality Date  . APPENDECTOMY    . BACK SURGERY    . CARPAL TUNNEL RELEASE Bilateral   . CYST EXCISION     behind rt ear,coxxyx  . CYSTECTOMY N/A    from spine.   Marland Kitchen DILATION AND CURETTAGE OF UTERUS    . SHOULDER OPEN ROTATOR CUFF REPAIR Bilateral   . TONSILLECTOMY    . TOTAL KNEE ARTHROPLASTY Right 10/02/2012   Procedure: TOTAL KNEE ARTHROPLASTY;  Surgeon: Valeria Batman, MD;  Location: [facility] OR;  Service: Orthopedics;  Laterality: Right;  Right Total Knee Arthroplasty  . TOTAL KNEE ARTHROPLASTY Left 12/25/2019  . TOTAL KNEE ARTHROPLASTY Left 12/25/2019   Procedure: LEFT TOTAL KNEE ARTHROPLASTY;  Surgeon: Eldred Manges, MD;  Location: [facility] OR;  Service: Orthopedics;  Laterality: Left;  . TRANSFORAMINAL LUMBAR INTERBODY FUSION (TLIF) WITH PEDICLE SCREW FIXATION 2  LEVEL Left 01/09/2017   L3-4, L4-5/notes 01/09/2017  . TUBAL LIGATION    . VAGINAL HYSTERECTOMY     Social History   Occupational History  . Not on file  Tobacco Use  . Smoking status: Never Smoker  . Smokeless tobacco: Never Used  Vaping Use  . Vaping Use: Never used  Substance and Sexual Activity  . Alcohol use: No  . Drug use: No  . Sexual activity: Not Currently

## 2020-01-18 ENCOUNTER — Other Ambulatory Visit: Payer: Self-pay | Admitting: Surgery

## 2020-01-21 ENCOUNTER — Other Ambulatory Visit: Payer: Self-pay | Admitting: Specialist

## 2020-02-13 ENCOUNTER — Other Ambulatory Visit: Payer: Self-pay

## 2020-02-13 ENCOUNTER — Ambulatory Visit (INDEPENDENT_AMBULATORY_CARE_PROVIDER_SITE_OTHER): Payer: Medicare Other | Admitting: Orthopaedic Surgery

## 2020-02-13 VITALS — Ht 65.0 in | Wt 220.0 lb

## 2020-02-13 DIAGNOSIS — Z96652 Presence of left artificial knee joint: Secondary | ICD-10-CM

## 2020-02-13 NOTE — Progress Notes (Signed)
Post-Op Visit Note   Patient: Laura Solis           Date of Birth: 05-29-1951           MRN: [id number] Visit Date: 02/13/2020 PCP: Joaquin Courts, DO   Assessment & Plan: Post left total knee arthroplasty range of motion is 0-1 18.  She is continuing therapy and is making good progress.  She still does a hop up when she gets up on the step today and needs to continue to work on quad strengthening.  I will recheck her in 2 months.  She is happy with the surgical result.  Chief Complaint:  Chief Complaint  Patient presents with   Left Knee - Routine Post Op   Visit Diagnoses:  1. Status post total left knee replacement     Plan: Post total knee arthroplasty making excellent progress now.  Return final visit 2 months.  Follow-Up Instructions: Return in about 2 months (around 04/15/2020).   Orders:  No orders of the defined types were placed in this encounter.  No orders of the defined types were placed in this encounter.   Imaging: No results found.  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    Class: Chronic   S/P lumbar spinal fusion 01/16/2018   Impingement syndrome of left shoulder 12/14/2017   S/P lumbar fusion 01/26/2017   Postoperative urinary retention 01/11/2017   Lumbar stenosis 01/09/2017   Restless legs 11/14/2016   Osteoarthritis 11/14/2016   Mixed anxiety and depressive disorder 11/14/2016   Attention deficit hyperactivity disorder 11/14/2016   Anxiety 11/14/2016   Adult attention deficit hyperactivity disorder 11/14/2016   Osteoarthritis of right knee 10/03/2012   Fibromyalgia syndrome 10/03/2012   Obesity (BMI 30.0-34.9) 10/03/2012   Past  Medical History:  Diagnosis Date   ADHD (attention deficit hyperactivity disorder)    Anxiety    "from the fibromyalgia"   Chronic lower back pain    Fibromyalgia    Headache    "bad one; monthly" (01/11/2017)   Osteoarthritis    "all over" (01/11/2017)   Pneumonia    "I've had it about 3 times" (01/11/2017)   PONV (postoperative nausea and vomiting)    Rotator cuff tear    left shoulder    No family history on file.  Past Surgical History:  Procedure Laterality Date   APPENDECTOMY     BACK SURGERY     CARPAL TUNNEL RELEASE Bilateral    CYST EXCISION     behind rt ear,coxxyx   CYSTECTOMY N/A    from spine.    DILATION AND CURETTAGE OF UTERUS     SHOULDER OPEN ROTATOR CUFF REPAIR Bilateral    TONSILLECTOMY     TOTAL KNEE ARTHROPLASTY Right 10/02/2012   Procedure: TOTAL KNEE ARTHROPLASTY;  Surgeon: Valeria Batman, MD;  Location: [facility] OR;  Service: Orthopedics;  Laterality: Right;  Right Total Knee Arthroplasty   TOTAL KNEE ARTHROPLASTY Left 12/25/2019   TOTAL KNEE ARTHROPLASTY Left 12/25/2019   Procedure: LEFT TOTAL KNEE ARTHROPLASTY;  Surgeon: Eldred Manges, MD;  Location: [facility] OR;  Service: Orthopedics;  Laterality: Left;   TRANSFORAMINAL LUMBAR INTERBODY FUSION (TLIF) WITH PEDICLE SCREW FIXATION 2 LEVEL  Left 01/09/2017   L3-4, L4-5/notes 01/09/2017   TUBAL LIGATION     VAGINAL HYSTERECTOMY     Social History   Occupational History   Not on file  Tobacco Use   Smoking status: Never Smoker   Smokeless tobacco: Never Used  Vaping Use   Vaping Use: Never used  Substance and Sexual Activity   Alcohol use: No   Drug use: No   Sexual activity: Not Currently

## 2020-04-16 ENCOUNTER — Ambulatory Visit: Payer: Medicare Other | Admitting: Orthopaedic Surgery

## 2020-04-23 ENCOUNTER — Ambulatory Visit (INDEPENDENT_AMBULATORY_CARE_PROVIDER_SITE_OTHER): Payer: Medicare Other | Admitting: Orthopaedic Surgery

## 2020-04-23 ENCOUNTER — Encounter: Payer: Self-pay | Admitting: Orthopaedic Surgery

## 2020-04-23 VITALS — BP 158/87 | Ht 65.0 in | Wt 220.0 lb

## 2020-04-23 DIAGNOSIS — Z96652 Presence of left artificial knee joint: Secondary | ICD-10-CM | POA: Diagnosis not present

## 2020-04-23 DIAGNOSIS — Z981 Arthrodesis status: Secondary | ICD-10-CM

## 2020-04-23 NOTE — Progress Notes (Signed)
Office Visit Note   Patient: Laura Solis           Date of Birth: 13-Feb-1951           MRN: [id number] Visit Date: 04/23/2020              Requested by: Joaquin Courts, DO [address] [place],  [place] [address] PCP: Joaquin Courts, DO   Assessment & Plan: Visit Diagnoses:  1. Status post total left knee replacement   2. S/P lumbar spinal fusion     Plan: Patient will continue to work on dieting gradual weight loss continue quad strengthening.  She has some balance problems which she states her mother had as she got older as well.  Patient has good pulses no neuropathy.  She is happy with results of left total knee arthroplasty.  Return as needed.  Follow-Up Instructions: No follow-ups on file.   Orders:  No orders of the defined types were placed in this encounter.  No orders of the defined types were placed in this encounter.     Procedures: No procedures performed   Clinical Data: No additional findings.   Subjective: Chief Complaint  Patient presents with  . Left Knee - Follow-up    12/25/2019 Left TKA    HPI follow-up left total knee arthroplasty 12/25/2019.  She is walking better has no pain she can go up steps but after 1 flight she notices increased weakness in her left quad.  Single step step up today is improved.  She stopped her therapy since summer therapy seems to be aggravating her back and she has been fused from L3-S1 after initial fusion with cage migration screw loosening and revision surgery with additional levels fused.  I did refer surgery Dr. Otelia Sergeant did a revision.  Review of Systems Updated unchanged.  Objective: Vital Signs: BP (!) 158/87   Ht 5\' 5"  (1.651 m)   Wt 220 lb (99.8 kg)   BMI 36.61 kg/m   Physical Exam Constitutional:      Appearance: She is well-developed.  HENT:     Head: Normocephalic.     Right Ear: External ear normal.     Left Ear: External ear normal.  Eyes:     Pupils: Pupils are equal, round,  and reactive to light.  Neck:     Thyroid: No thyromegaly.     Trachea: No tracheal deviation.  Cardiovascular:     Rate and Rhythm: Normal rate.  Pulmonary:     Effort: Pulmonary effort is normal.  Abdominal:     Palpations: Abdomen is soft.  Skin:    General: Skin is warm and dry.  Neurological:     Mental Status: She is alert and oriented to person, place, and time.  Psychiatric:        Behavior: Behavior normal.     Ortho Exam no swelling left knee.  Full range of motion full extension flexion to 120.  She can step up on a step with slight hip flexion minimal hop.  Manual quad strength shows good resistance.  Anterior tib is strong.  She has slight numbness in her toes.  Specialty Comments:  No specialty comments available.  Imaging: No results found.   PMFS History: Patient Active Problem List   Diagnosis Date Noted  . S/P total knee arthroplasty 12/26/2019  . Chronic pain of left knee 04/11/2018  . Anemia associated with acute blood loss 01/17/2018    Class: Acute  . Loosening of  hardware in spine (HCC) 01/16/2018    Class: Chronic  . S/P lumbar spinal fusion 01/16/2018  . Impingement syndrome of left shoulder 12/14/2017  . S/P lumbar fusion 01/26/2017  . Postoperative urinary retention 01/11/2017  . Lumbar stenosis 01/09/2017  . Restless legs 11/14/2016  . Osteoarthritis 11/14/2016  . 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 Attention deficit hyperactivity disorder 11/14/2016  . Anxiety 11/14/2016  . Adult attention deficit hyperactivity disorder 11/14/2016  . Osteoarthritis of right knee 10/03/2012  . Fibromyalgia syndrome 10/03/2012  . Obesity (BMI 30.0-34.9) 10/03/2012   Past Medical History:  Diagnosis Date  . ADHD (attention deficit hyperactivity disorder)   . Anxiety    "from the fibromyalgia"  . Chronic lower back pain   . Fibromyalgia   . Headache    "bad one; monthly" (01/11/2017)  . Osteoarthritis    "all over" (01/11/2017)  .  Pneumonia    "I've had it about 3 times" (01/11/2017)  . PONV (postoperative nausea and vomiting)   . Rotator cuff tear    left shoulder    No family history on file.  Past Surgical History:  Procedure Laterality Date  . APPENDECTOMY    . BACK SURGERY    . CARPAL TUNNEL RELEASE Bilateral   . CYST EXCISION     behind rt ear,coxxyx  . CYSTECTOMY N/A    from spine.   Marland Kitchen DILATION AND CURETTAGE OF UTERUS    . SHOULDER OPEN ROTATOR CUFF REPAIR Bilateral   . TONSILLECTOMY    . TOTAL KNEE ARTHROPLASTY Right 10/02/2012   Procedure: TOTAL KNEE ARTHROPLASTY;  Surgeon: Valeria Batman, MD;  Location: [facility] OR;  Service: Orthopedics;  Laterality: Right;  Right Total Knee Arthroplasty  . TOTAL KNEE ARTHROPLASTY Left 12/25/2019  . TOTAL KNEE ARTHROPLASTY Left 12/25/2019   Procedure: LEFT TOTAL KNEE ARTHROPLASTY;  Surgeon: Eldred Manges, MD;  Location: [facility] OR;  Service: Orthopedics;  Laterality: Left;  . TRANSFORAMINAL LUMBAR INTERBODY FUSION (TLIF) WITH PEDICLE SCREW FIXATION 2 LEVEL Left 01/09/2017   L3-4, L4-5/notes 01/09/2017  . TUBAL LIGATION    . VAGINAL HYSTERECTOMY     Social History   Occupational History  . Not on file  Tobacco Use  . Smoking status: Never Smoker  . Smokeless tobacco: Never Used  Vaping Use  . Vaping Use: Never used  Substance and Sexual Activity  . Alcohol use: No  . Drug use: No  . Sexual activity: Not Currently

## 2020-12-24 ENCOUNTER — Telehealth: Payer: Self-pay | Admitting: Specialist

## 2020-12-24 NOTE — Telephone Encounter (Signed)
Pt called stating dr. Otelia Sergeant did her back surgery a little while ago and she was so pleased she was wondering if he would do her shoulder surgery? Pt would like a CB to let her know but she will be going out @ 12:30 today she thinks tomorrow would be better for a CB.  [phone]

## 2020-12-24 NOTE — Telephone Encounter (Signed)
See message.

## 2020-12-25 NOTE — Telephone Encounter (Signed)
I asked Dr. Otelia Sergeant about this and he states that he had not done a shoulder surgery in 20 yrs, and that he would recommend Dr. August Saucer for her shoulder.   I called and advised patient of the above message from Dr. Otelia Sergeant and I sched her for 01/11/21 @ 3pm with Dr. August Saucer

## 2021-01-11 ENCOUNTER — Other Ambulatory Visit: Payer: Self-pay

## 2021-01-11 ENCOUNTER — Ambulatory Visit: Payer: Self-pay

## 2021-01-11 ENCOUNTER — Ambulatory Visit (INDEPENDENT_AMBULATORY_CARE_PROVIDER_SITE_OTHER): Payer: Medicare Other | Admitting: Orthopedic Surgery

## 2021-01-11 DIAGNOSIS — R200 Anesthesia of skin: Secondary | ICD-10-CM | POA: Diagnosis not present

## 2021-01-11 DIAGNOSIS — M79642 Pain in left hand: Secondary | ICD-10-CM

## 2021-01-11 DIAGNOSIS — M25512 Pain in left shoulder: Secondary | ICD-10-CM

## 2021-01-12 ENCOUNTER — Encounter: Payer: Self-pay | Admitting: Orthopedic Surgery

## 2021-01-12 NOTE — Progress Notes (Signed)
Office Visit Note   Patient: Laura Solis           Date of Birth: 02-Aug-1950           MRN: [id number] Visit Date: 01/11/2021 Requested by: Joaquin Courts, DO [address] [place],  [place] [address] PCP: Joaquin Courts, DO  Subjective: Chief Complaint  Patient presents with   Other    Left hand weakness    HPI: Laura Solis is a 70 y.o. female who presents to the office complaining of left hand numbness and tingling.  Patient was reportedly referred to the office for left shoulder pain for 5 to 6 years in the setting of prior bilateral rotator cuff repair but patient reports that she has had no left shoulder pain or radicular arm pain for several years now.  Denies any shoulder pain or dysfunction.  Her main complaint today he is over the last 4 to 5 months she has noticed increasing numbness and tingling primarily in her left hand.  She denies any involvement of the forearm or upper arm.  Denies any increased neck pain or any shoulder blade pain.  Pain does not wake her up but she does occasionally wake up and have to shake her hand to restore sensation; this happens about 1-2 times per night..  She is not using braces at night.  Primarily the numbness and tingling is localized to the thumb and index finger.  She does have involvement in the other 3 fingers as well though.  Denies any history of diabetes.  She does have history of bilateral carpal tunnel release cells about 20 years ago.  She is currently driving a schoolbus part-time and enjoys reading in her free time.  Additionally, she reports that she has had triggering in her left ring, index, thumb.  This triggering has been going on for 1 year and occasionally she has to manually reduce the fingers with her other hand.  She has never had injection for trigger finger or any surgery for trigger finger.  This is not bothering her nearly as much as the numbness and tingling in her hand.  She would like to address this  first.              ROS: All systems reviewed are negative as they relate to the chief complaint within the history of present illness.  Patient denies fevers or chills.  Assessment & Plan: Visit Diagnoses:  1. Numbness of left hand     Plan: Patient is a 70 year old female who presents complaining of left hand numbness and tingling.  She has numbness and tingling primarily in the left thumb and index finger but all fingers are involved at some point throughout the day.  She does have history of bilateral carpal tunnel release but this was 20 years ago so potentially she could have recurrent carpal tunnel syndrome.  Also dealing with trigger fingers but these are not bothersome to her.  Plan to order nerve conduction study of the left upper extremity for further evaluation of recurrent carpal tunnel syndrome versus ulnar neuropathy..  Does not seem to be radicular or related to nerve compression in the cervical spine but that is another consideration if nerve conduction study is normal.  Follow-Up Instructions: No follow-ups on file.   Orders:  Orders Placed This Encounter  Procedures   XR Shoulder Left   XR Cervical Spine 2 or 3 views   Ambulatory referral to Neurology   No orders  of the defined types were placed in this encounter.     Procedures: No procedures performed   Clinical Data: No additional findings.  Objective: Vital Signs: There were no vitals taken for this visit.  Physical Exam:  Constitutional: Patient appears well-developed HEENT:  Head: Normocephalic Eyes:EOM are normal Neck: Normal range of motion Cardiovascular: Normal rate Pulmonary/chest: Effort normal Neurologic: Patient is alert Skin: Skin is warm Psychiatric: Patient has normal mood and affect  Ortho Exam: Ortho exam demonstrates left hand with some interosseous muscle wasting compared with the contralateral hand.  No thenar or hypothenar muscle wasting.  EPL function intact.  Finger abduction  function intact but weaker on the left side with difficulty abduction in the small finger in particular.  Positive Froment's sign on the left.  Negative on right.  Negative Tinel sign.  Positive elbow flexion sign.  No subluxing ulnar nerve on the left.  Positive Phalen sign on left.  Specialty Comments:  No specialty comments available.  Imaging: No results found.   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    Class: Chronic   S/P lumbar spinal fusion 01/16/2018   Impingement syndrome of left shoulder 12/14/2017   S/P lumbar fusion 01/26/2017   Postoperative urinary retention 01/11/2017   Lumbar stenosis 01/09/2017   Restless legs 11/14/2016   Osteoarthritis 11/14/2016   Mixed anxiety and depressive disorder 11/14/2016   Attention deficit hyperactivity disorder 11/14/2016   Anxiety 11/14/2016   Adult attention deficit hyperactivity disorder 11/14/2016   Osteoarthritis of right knee 10/03/2012   Fibromyalgia syndrome 10/03/2012   Obesity (BMI 30.0-34.9) 10/03/2012   Past Medical History:  Diagnosis Date   ADHD (attention deficit hyperactivity disorder)    Anxiety    "from the fibromyalgia"   Chronic lower back pain    Fibromyalgia    Headache    "bad one; monthly" (01/11/2017)   Osteoarthritis    "all over" (01/11/2017)   Pneumonia    "I've had it about 3 times" (01/11/2017)   PONV (postoperative nausea and vomiting)    Rotator cuff tear    left shoulder    No family history on file.  Past Surgical History:  Procedure Laterality Date   APPENDECTOMY     BACK SURGERY     CARPAL TUNNEL RELEASE Bilateral    CYST EXCISION     behind rt ear,coxxyx   CYSTECTOMY N/A    from spine.    DILATION AND CURETTAGE OF UTERUS     SHOULDER OPEN ROTATOR CUFF  REPAIR Bilateral    TONSILLECTOMY     TOTAL KNEE ARTHROPLASTY Right 10/02/2012   Procedure: TOTAL KNEE ARTHROPLASTY;  Surgeon: Valeria Batman, MD;  Location: [facility] OR;  Service: Orthopedics;  Laterality: Right;  Right Total Knee Arthroplasty   TOTAL KNEE ARTHROPLASTY Left 12/25/2019   TOTAL KNEE ARTHROPLASTY Left 12/25/2019   Procedure: LEFT TOTAL KNEE ARTHROPLASTY;  Surgeon: Eldred Manges, MD;  Location: [facility] OR;  Service: Orthopedics;  Laterality: Left;   TRANSFORAMINAL LUMBAR INTERBODY FUSION (TLIF) WITH PEDICLE SCREW FIXATION 2 LEVEL Left 01/09/2017   L3-4, L4-5/notes 01/09/2017   TUBAL LIGATION     VAGINAL HYSTERECTOMY     Social History   Occupational History   Not on file  Tobacco Use  Smoking status: Never   Smokeless tobacco: Never  Vaping Use   Vaping Use: Never used  Substance and Sexual Activity   Alcohol use: No   Drug use: No   Sexual activity: Not Currently

## 2021-01-14 ENCOUNTER — Encounter: Payer: Self-pay | Admitting: Orthopedic Surgery

## 2021-07-28 IMAGING — CR DG CHEST 2V
2 series · 2 of 2 positions shown · non-contrast
Comparison: 01/17/2018

CLINICAL DATA: Left knee replacement preop evaluation.

EXAM:
CHEST - 2 VIEW

[w chest pa]
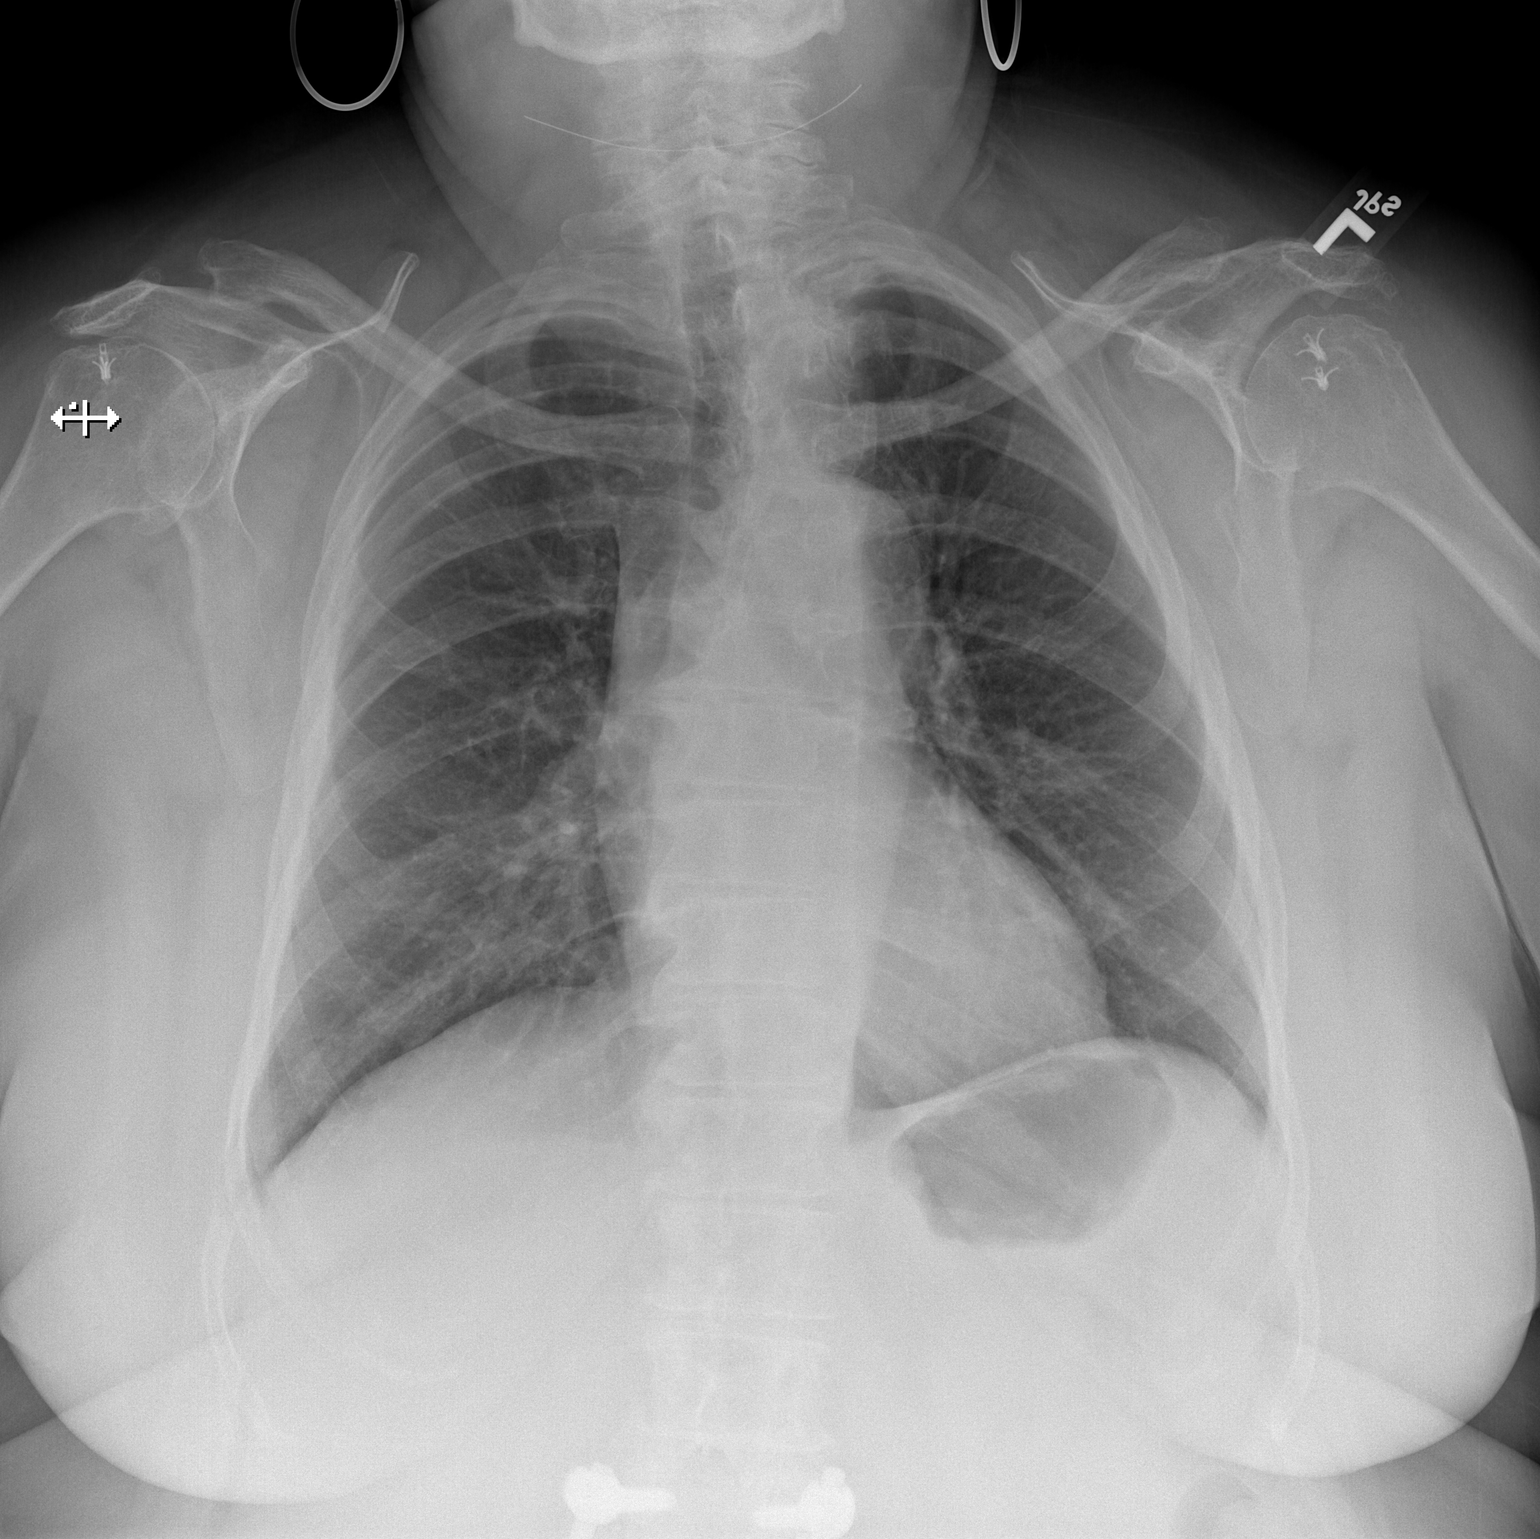

[w chest lat]
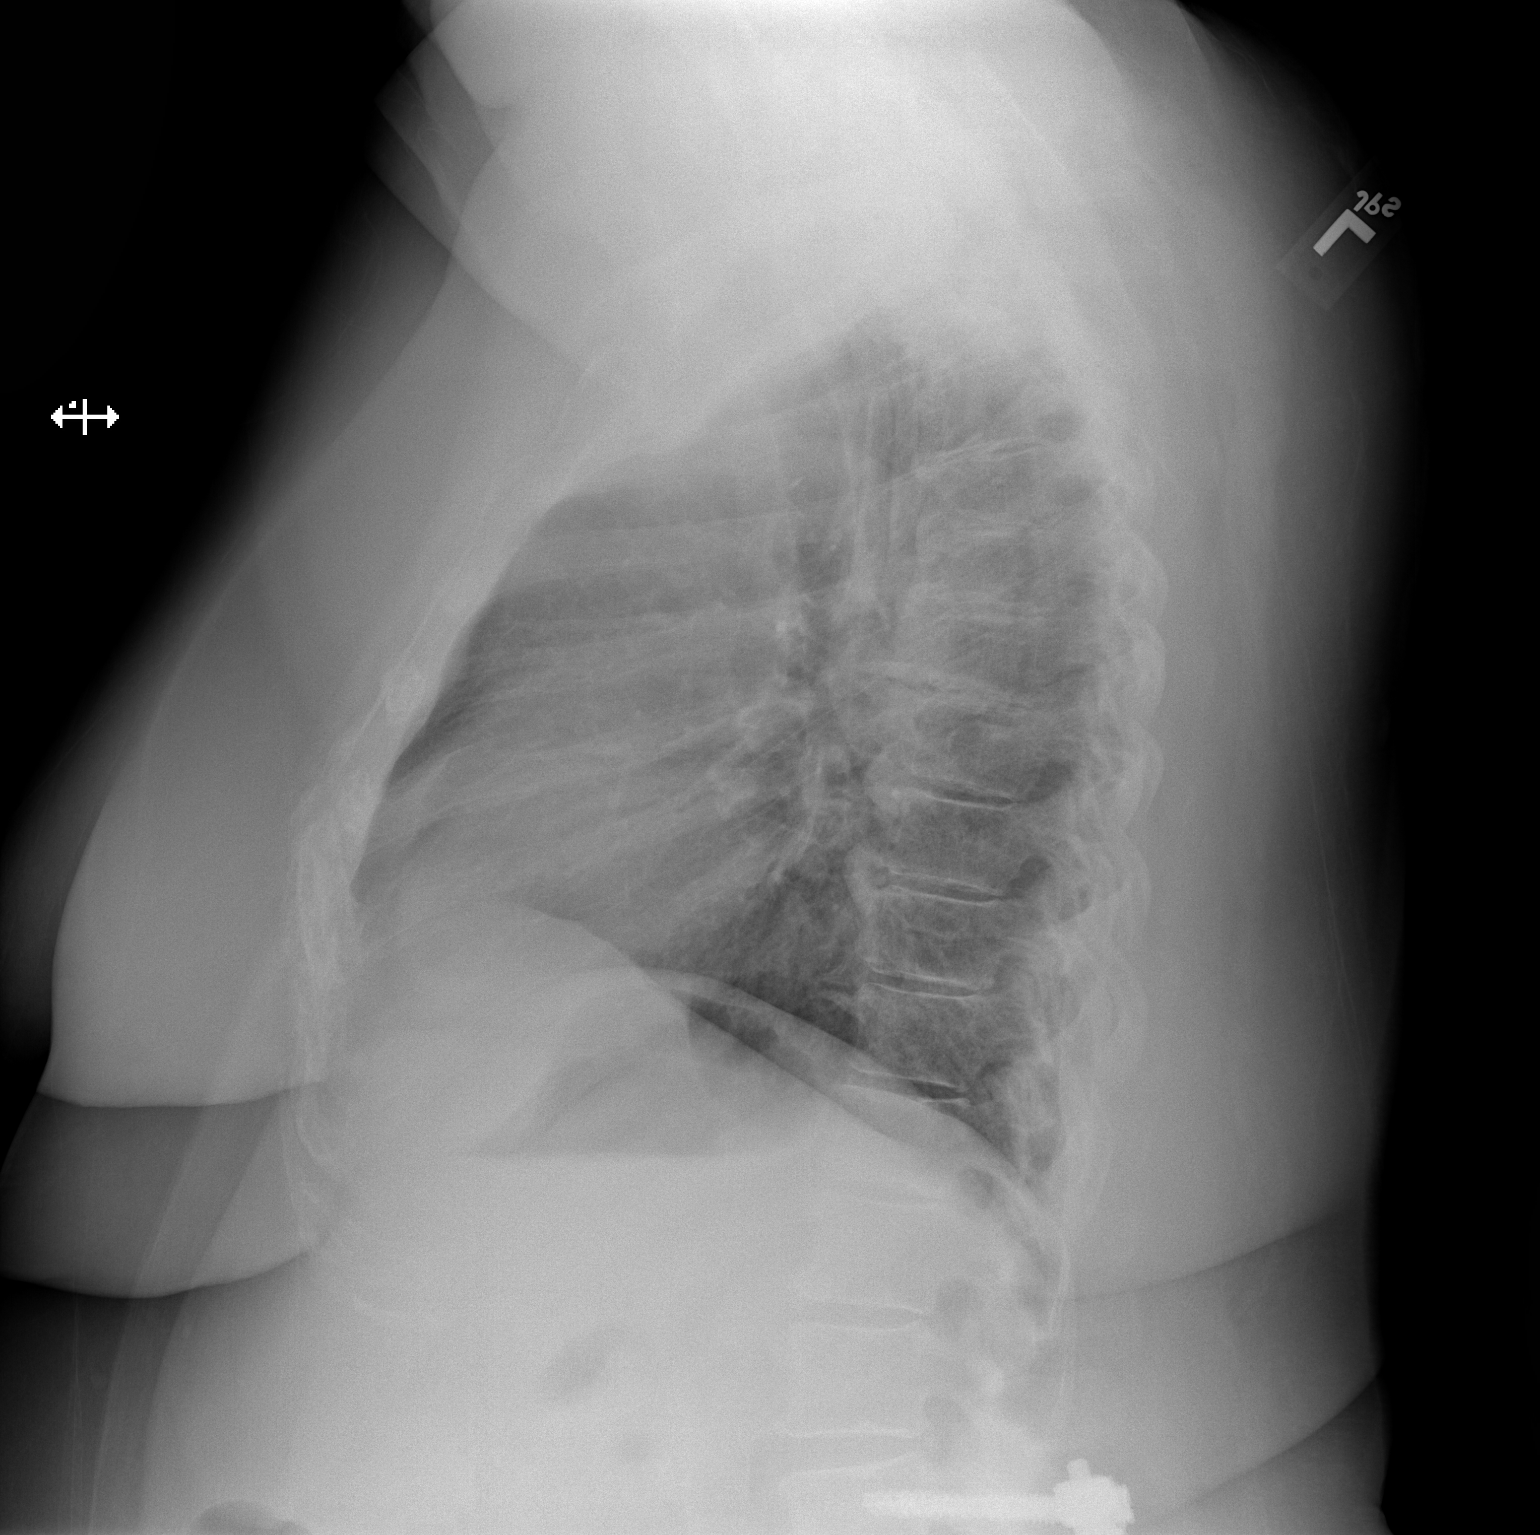

[2 of 2 positions shown; findings below may reference images not displayed]

FINDINGS: Lungs are adequately inflated without consolidation or effusion.
Cardiomediastinal silhouette and remainder the exam is unchanged.
IMPRESSION: No active cardiopulmonary disease.

## 2022-01-26 ENCOUNTER — Encounter: Payer: Self-pay | Admitting: Surgery

## 2022-01-26 ENCOUNTER — Ambulatory Visit: Payer: Medicare Other

## 2022-01-26 ENCOUNTER — Ambulatory Visit (INDEPENDENT_AMBULATORY_CARE_PROVIDER_SITE_OTHER): Payer: Medicare Other | Admitting: Surgery

## 2022-01-26 DIAGNOSIS — Z981 Arthrodesis status: Secondary | ICD-10-CM | POA: Diagnosis not present

## 2022-01-26 DIAGNOSIS — M461 Sacroiliitis, not elsewhere classified: Secondary | ICD-10-CM | POA: Diagnosis not present

## 2022-01-26 MED ORDER — METHYLPREDNISOLONE 4 MG PO TABS: ORAL_TABLET | ORAL | 0 refills | Status: DC

## 2022-01-26 NOTE — Progress Notes (Signed)
Office Visit Note   Patient: Laura Solis           Date of Birth: [birth year]           MRN: [id number] Visit Date: 01/26/2022              Requested by: Joaquin Courts, DO [address] [place],  [place] [address] PCP: Joaquin Courts, DO   Assessment & Plan: Visit Diagnoses:  1. S/P lumbar spinal fusion   2. Left SI (sacroiliac) joint inflammation (HCC)      Plan: I will schedule patient for diagnostic/therapeutic left SI joint injection with Dr. Alvester Morin.  Also did send in a Medrol Dosepak 6-day taper for her to use in case it is a while before she can get in with him.  Follow-up with me in 3 weeks for recheck.  If she gets complete relief from the prednisone taper she can hold off on doing the injection.  Follow-Up Instructions: Return in about 3 weeks (around 02/16/2022) for WITH Casten Floren RECHECK LEFT SI JOINT PAIN.   Orders:  Orders Placed This Encounter  Procedures   XR Lumbar Spine 2-3 Views   Meds ordered this encounter  Medications   methylPREDNISolone (MEDROL) 4 MG tablet    Sig: 6 DAY TAPER TO BE TAKEN AS DIRECTED    Dispense:  21 tablet    Refill:  0      Procedures: No procedures performed   Clinical Data: No additional findings.   Subjective: Chief Complaint  Patient presents with   Lower Back - Follow-up    HPI 70 year old white female comes in today with complaints of left-sided low back pain.  Patient localizes pain around the left SI joint.  No lower extremity radiculopathy.  She is status post previous lumbar fusion.  She has tried ibuprofen without much improvement.  No recent injury.  States that pain is aggravated when she is laying down, ambulating or when she flexes her left hip externally rotates. Review of Systems No current cardiopulmonary GI/GU issue  Objective: Vital Signs: There were no vitals taken for this visit.  Physical Exam HENT:     Head: Normocephalic and atraumatic.  Pulmonary:     Effort: Pulmonary effort  is normal. No respiratory distress.  Musculoskeletal:     Comments: Gait is somewhat antalgic.  She is much more tenderness over the left SI joint.  Nontender over the right side.  Negative logroll.  Negative straight leg raise.  No focal motor deficits.  Neurological:     Mental Status: She is alert and oriented to person, place, and time.     Ortho Exam  Specialty Comments:  No specialty comments available.  Imaging: No results found.   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    Class: Chronic   S/P lumbar spinal fusion 01/16/2018   Impingement syndrome of left shoulder 12/14/2017   S/P lumbar fusion 01/26/2017   Postoperative urinary retention 01/11/2017   Lumbar stenosis 01/09/2017   Restless legs 11/14/2016   Osteoarthritis 11/14/2016   Mixed anxiety and depressive disorder 11/14/2016   Attention deficit hyperactivity disorder 11/14/2016   Anxiety 11/14/2016   Adult attention deficit hyperactivity disorder 11/14/2016   Osteoarthritis of right knee 10/03/2012  Fibromyalgia syndrome 10/03/2012   Obesity (BMI 30.0-34.9) 10/03/2012   Past Medical History:  Diagnosis Date   ADHD (attention deficit hyperactivity disorder)    Anxiety    "from the fibromyalgia"   Chronic lower back pain    Fibromyalgia    Headache    "bad one; monthly" (01/11/2017)   Osteoarthritis    "all over" (01/11/2017)   Pneumonia    "I've had it about 3 times" (01/11/2017)   PONV (postoperative nausea and vomiting)    Rotator cuff tear    left shoulder    History reviewed. No pertinent family history.  Past Surgical History:  Procedure Laterality Date   APPENDECTOMY     BACK SURGERY     CARPAL TUNNEL RELEASE Bilateral    CYST EXCISION     behind rt ear,coxxyx    CYSTECTOMY N/A    from spine.    DILATION AND CURETTAGE OF UTERUS     SHOULDER OPEN ROTATOR CUFF REPAIR Bilateral    TONSILLECTOMY     TOTAL KNEE ARTHROPLASTY Right 10/02/2012   Procedure: TOTAL KNEE ARTHROPLASTY;  Surgeon: Valeria Batman, MD;  Location: [facility];  Service: Orthopedics;  Laterality: Right;  Right Total Knee Arthroplasty   TOTAL KNEE ARTHROPLASTY Left 12/25/2019   TOTAL KNEE ARTHROPLASTY Left 12/25/2019   Procedure: LEFT TOTAL KNEE ARTHROPLASTY;  Surgeon: Eldred Manges, MD;  Location: [facility] OR;  Service: Orthopedics;  Laterality: Left;   TRANSFORAMINAL LUMBAR INTERBODY FUSION (TLIF) WITH PEDICLE SCREW FIXATION 2 LEVEL Left 01/09/2017   L3-4, L4-5/notes 01/09/2017   TUBAL LIGATION     VAGINAL HYSTERECTOMY     Social History   Occupational History   Not on file  Tobacco Use   Smoking status: Never   Smokeless tobacco: Never  Vaping Use   Vaping Use: Never used  Substance and Sexual Activity   Alcohol use: No   Drug use: No   Sexual activity: Not Currently

## 2022-03-01 ENCOUNTER — Ambulatory Visit (INDEPENDENT_AMBULATORY_CARE_PROVIDER_SITE_OTHER): Payer: Medicare Other | Admitting: Physical Medicine and Rehabilitation

## 2022-03-01 ENCOUNTER — Encounter: Payer: Self-pay | Admitting: Physical Medicine and Rehabilitation

## 2022-03-01 ENCOUNTER — Ambulatory Visit: Payer: Self-pay

## 2022-03-01 VITALS — BP 118/73 | HR 88

## 2022-03-01 DIAGNOSIS — M461 Sacroiliitis, not elsewhere classified: Secondary | ICD-10-CM

## 2022-03-01 NOTE — Progress Notes (Signed)
Laura Solis - 71 y.o. female MRN [id number]  Date of birth: 02/06/1951  Office Visit Note: Visit Date: 03/01/2022 PCP: Joaquin Courts, DO Referred by: Joaquin Courts, DO  Subjective: Chief Complaint  Patient presents with   Lower Back - Pain    Left sided pain   HPI:  Laura Solis is a 71 y.o. female who comes in today at the request of Zonia Kief, PA-C for planned Left anesthetic Sacroiliac joint arthrogram with fluoroscopic guidance.  The patient has failed conservative care including home exercise, medications, time and activity modification.  This injection will be diagnostic and hopefully therapeutic.  Please see requesting physician notes for further details and justification.   Positive Fortin finger sign, Patrick's testing, and lateral compression test.    ROS Otherwise per HPI.  Assessment & Plan: Visit Diagnoses:    ICD-10-CM   1. Sacroiliitis (HCC)  M46.1 XR C-ARM NO REPORT    Sacroiliac Joint Inj, Left      Plan: No additional findings.   Meds & Orders: No orders of the defined types were placed in this encounter.   Orders Placed This Encounter  Procedures   Sacroiliac Joint Inj, Left   XR C-ARM NO REPORT    Follow-up: Return for visit to requesting provider as needed.   Procedures: Sacroiliac Joint Inj, Left on 03/01/2022 2:51 PM Indications: pain and diagnostic evaluation Details: 22 G 3.5 in needle, fluoroscopy-guided posterior approach Medications: 2 mL bupivacaine 0.5 %; 80 mg methylPREDNISolone acetate 80 MG/ML Outcome: tolerated well, no immediate complications  Sacroiliac Joint Intra-Articular Injection - Posterior Approach with Fluoroscopic Guidance   Position: PRONE  Additional Comments: Vital signs were monitored before and after the procedure. Patient was prepped and draped in the usual sterile fashion. The correct patient, procedure, and site was verified.   Injection Procedure Details:   Location/Site:  Sacroiliac  joint  Needle size: 3.5 in Spinal Needle  Needle type: Spinal  Needle Placement: Intra-articular  Findings:  -Comments: There was excellent flow of contrast producing a partial arthrogram of the sacroiliac joint.   Procedure Details: Starting with a 90 degree vertical and midline orientation the fluoroscope was tilted cranially 20 to 25 degrees and the target area of the inferior most part of the SI joint on the side mentioned above was visualized.  The soft tissues overlying this target were infiltrated with 4 ml. of 1% Lidocaine without Epinephrine. A #22 gauge spinal needle was inserted perpendicular to the fluoroscope table and advanced into the posterior inferior joint space using fluoroscopic guidance.  Position in the joint space was confirmed by obtaining a partial arthrogram using a 2 ml. volume of Isovue-250 contrast agent. After negative aspirate for gross pus or blood, the injectate was delivered to the joint. Radiographs were obtained for documentation purposes.   Additional Comments:   Dressing: Bandaid    Post-procedure details: Patient was observed during the procedure. Post-procedure instructions were reviewed.  Patient left the clinic in stable condition.    There was excellent flow of contrast producing a partial arthrogram of the sacroiliac joint.  Procedure, treatment alternatives, risks and benefits explained, specific risks discussed. Consent was given by the patient. Immediately prior to procedure a time out was called to verify the correct patient, procedure, equipment, support staff and site/side marked as required. Patient was prepped and draped in the usual sterile fashion.          Clinical History: No specialty comments available.  Objective:  VS:  HT:    WT:   BMI:     BP:118/73  HR:88bpm  TEMP: ( )  RESP:  Physical Exam   Imaging: No results found.

## 2022-03-01 NOTE — Progress Notes (Signed)
Numeric Pain Rating Scale and Functional Assessment Average Pain 9   In the last MONTH (on 0-10 scale) has pain interfered with the following?  1. General activity like being  able to carry out your everyday physical activities such as walking, climbing stairs, carrying groceries, or moving a chair?  Rating(10)  Hurts more when standing and doing activities. +Driver, -BT, -Dye Allergies.

## 2022-03-10 MED ORDER — BUPIVACAINE HCL 0.5 % IJ SOLN
2.0000 mL | INTRAMUSCULAR | Status: AC | PRN
  Administered 2022-03-01: 2 mL via INTRA_ARTICULAR

## 2022-03-10 MED ORDER — METHYLPREDNISOLONE ACETATE 80 MG/ML IJ SUSP
80.0000 mg | INTRAMUSCULAR | Status: AC | PRN
  Administered 2022-03-01: 80 mg via INTRA_ARTICULAR

## 2022-03-31 ENCOUNTER — Encounter: Payer: Self-pay | Admitting: Surgery

## 2022-03-31 ENCOUNTER — Ambulatory Visit (INDEPENDENT_AMBULATORY_CARE_PROVIDER_SITE_OTHER): Payer: Medicare Other | Admitting: Surgery

## 2022-03-31 VITALS — BP 114/80 | HR 71 | Ht 65.0 in | Wt 220.0 lb

## 2022-03-31 DIAGNOSIS — Z981 Arthrodesis status: Secondary | ICD-10-CM | POA: Diagnosis not present

## 2022-03-31 DIAGNOSIS — M48061 Spinal stenosis, lumbar region without neurogenic claudication: Secondary | ICD-10-CM

## 2022-03-31 NOTE — Progress Notes (Signed)
Office Visit Note   Patient: Laura Solis           Date of Birth: 1950-11-17           MRN: [id number] Visit Date: 03/31/2022              Requested by: Joaquin Courts, DO [address] [place],  [place] [address] PCP: Joaquin Courts, DO   Assessment & Plan: Visit Diagnoses:  1. S/P lumbar fusion   2. Spinal stenosis of lumbar region, unspecified whether neurogenic claudication present     Plan: Since patient continues have ongoing symptoms that failed conservative treatment I will order lumbar MRI scan with and without contrast to rule out HNP/stenosis above her fusion.  She will keep follow-up appointment with Dr. Otelia Sergeant scheduled on September 15 and he can review the MRI with her at that time.  Follow-Up Instructions: Return for keep appointment as scheduled with DR [facility] september 15th to review lumbar mri.   Orders:  Orders Placed This Encounter  Procedures   MR Lumbar Spine W Wo Contrast   No orders of the defined types were placed in this encounter.     Procedures: No procedures performed   Clinical Data: No additional findings.   Subjective: Chief Complaint  Patient presents with   Lower Back - Pain    HPI 71 year old white female returns with complaints of worsening ongoing low back pain and lower extremity radiculopathy.  Patient last seen by me June 2023 and was given a prednisone taper.  She was also scheduled to have SI joint injection with Dr. Alvester Morin and just recently had this done and says that this did not give any improvement.  As previously documented she has had multilevel lumbar fusion. Review of Systems No current cardiopulmonary GI/GU issues  Objective: Vital Signs: BP 114/80   Pulse 71   Ht 5\' 5"  (1.651 m)   Wt 220 lb (99.8 kg)   BMI 36.61 kg/m   Physical Exam HENT:     Head: Normocephalic and atraumatic.  Eyes:     Extraocular Movements: Extraocular movements intact.  Musculoskeletal:     Comments: Bilateral  lumbar paraspinal tenderness.  Negative logroll.  Some discomfort with bilateral straight leg raise.  Neurological:     Mental Status: She is oriented to person, place, and time.  Psychiatric:        Mood and Affect: Mood normal.     Ortho Exam  Specialty Comments:  No specialty comments available.  Imaging: No results found.   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    Class: Chronic   S/P lumbar spinal fusion 01/16/2018   Impingement syndrome of left shoulder 12/14/2017   S/P lumbar fusion 01/26/2017   Postoperative urinary retention 01/11/2017   Lumbar stenosis 01/09/2017   Restless legs 11/14/2016   Osteoarthritis 11/14/2016   Mixed anxiety and depressive disorder 11/14/2016   Attention deficit hyperactivity disorder 11/14/2016   Anxiety 11/14/2016   Adult attention deficit hyperactivity disorder 11/14/2016   Osteoarthritis of right knee 10/03/2012   Fibromyalgia syndrome 10/03/2012   Obesity (BMI 30.0-34.9) 10/03/2012   Past Medical History:  Diagnosis Date   ADHD (attention deficit hyperactivity disorder)    Anxiety    "from  the fibromyalgia"   Chronic lower back pain    Fibromyalgia    Headache    "bad one; monthly" (01/11/2017)   Osteoarthritis    "all over" (01/11/2017)   Pneumonia    "I've had it about 3 times" (01/11/2017)   PONV (postoperative nausea and vomiting)    Rotator cuff tear    left shoulder    History reviewed. No pertinent family history.  Past Surgical History:  Procedure Laterality Date   APPENDECTOMY     BACK SURGERY     CARPAL TUNNEL RELEASE Bilateral    CYST EXCISION     behind rt ear,coxxyx   CYSTECTOMY N/A    from spine.    DILATION AND CURETTAGE OF UTERUS     SHOULDER OPEN ROTATOR CUFF REPAIR  Bilateral    TONSILLECTOMY     TOTAL KNEE ARTHROPLASTY Right 10/02/2012   Procedure: TOTAL KNEE ARTHROPLASTY;  Surgeon: Valeria Batman, MD;  Location: [facility] OR;  Service: Orthopedics;  Laterality: Right;  Right Total Knee Arthroplasty   TOTAL KNEE ARTHROPLASTY Left 12/25/2019   TOTAL KNEE ARTHROPLASTY Left 12/25/2019   Procedure: LEFT TOTAL KNEE ARTHROPLASTY;  Surgeon: Eldred Manges, MD;  Location: [facility] OR;  Service: Orthopedics;  Laterality: Left;   TRANSFORAMINAL LUMBAR INTERBODY FUSION (TLIF) WITH PEDICLE SCREW FIXATION 2 LEVEL Left 01/09/2017   L3-4, L4-5/notes 01/09/2017   TUBAL LIGATION     VAGINAL HYSTERECTOMY     Social History   Occupational History   Not on file  Tobacco Use   Smoking status: Never   Smokeless tobacco: Never  Vaping Use   Vaping Use: Never used  Substance and Sexual Activity   Alcohol use: No   Drug use: No   Sexual activity: Not Currently

## 2022-04-07 ENCOUNTER — Ambulatory Visit (INDEPENDENT_AMBULATORY_CARE_PROVIDER_SITE_OTHER): Payer: Medicare Other

## 2022-04-07 ENCOUNTER — Ambulatory Visit (HOSPITAL_COMMUNITY)
Admission: RE | Admit: 2022-04-07 | Discharge: 2022-04-07 | Disposition: A | Discharge status: Home / Self Care | Payer: Medicare Other | Source: Ambulatory Visit | Attending: Orthopaedic Surgery | Admitting: Orthopaedic Surgery

## 2022-04-07 ENCOUNTER — Encounter: Payer: Self-pay | Admitting: Orthopaedic Surgery

## 2022-04-07 ENCOUNTER — Ambulatory Visit (INDEPENDENT_AMBULATORY_CARE_PROVIDER_SITE_OTHER): Payer: Medicare Other | Admitting: Orthopaedic Surgery

## 2022-04-07 VITALS — Ht 65.5 in | Wt 220.0 lb

## 2022-04-07 DIAGNOSIS — M47812 Spondylosis without myelopathy or radiculopathy, cervical region: Secondary | ICD-10-CM

## 2022-04-07 DIAGNOSIS — M25562 Pain in left knee: Secondary | ICD-10-CM

## 2022-04-07 MED ORDER — HYDROCODONE-ACETAMINOPHEN 5-325 MG PO TABS: 1.0000 | ORAL_TABLET | Freq: Four times a day (QID) | ORAL | 0 refills | Status: DC | PRN

## 2022-04-07 NOTE — H&P (View-Only) (Signed)
Office Visit Note   Patient: Laura Solis           Date of Birth: 1951/02/19           MRN: [id number] Visit Date: 04/07/2022              Requested by: Joaquin Courts, DO [address] [place],  [place] [address] PCP: Joaquin Courts, DO   Assessment & Plan: Visit Diagnoses:  1. Acute pain of left knee   2. Cervical spondylosis     Plan: Patient with bilateral lower extremity weakness upper extremity weakness with severe spondylosis likely stenosis with myelopathic changes.  She has weakness upper and lower extremities with severe spurring her neck.  Patient needs urgent MRI scan to rule out progressive cord compression.  Patient also has pending MRI scan of her lumbar spine with and without contrast and she has had previous multilevel fusion procedure.  Her lumbar MRI scan is not urgent and will be routinely scheduled.  ADDENDUM: MRI scan was done today and I have included below the called report.  Patient has myelopathic changes with severe cord compression at C3-4 and C4-5 with cord atrophy and myelomalacia.  She has some foraminal narrowing at other levels nothing severe.  Plan will be two-level cervical fusion C3-4 C4-5 with allograft and plate overnight stay soft collar x6 weeks.  We discussed should be out of work and we discussed that some of her weakness may be permanent with the cord changes seen in surgery is done urgently to prevent progression of her numbness and weakness in her arms and legs.  Questions elicited and answered she understands and request to proceed.   Follow-Up Instructions: No follow-ups on file.   Orders:  Orders Placed This Encounter  Procedures   XR KNEE 3 VIEW LEFT   MR Cervical Spine w/o contrast   XR Cervical Spine 2 or 3 views   Meds ordered this encounter  Medications   HYDROcodone-acetaminophen (NORCO/VICODIN) 5-325 MG tablet    Sig: Take 1 tablet by mouth every 6 (six) hours as needed for moderate pain.    Dispense:  30  tablet    Refill:  0      Procedures: No procedures performed   Clinical Data: No additional findings.   Subjective: Chief Complaint  Patient presents with   Left Knee - Pain    HPI 71 year old female is here with lower extremity weakness severe pain in the lateral aspect of her left knee.  She was sent for therapy has stopped therapy for the last week due to increasing pain in her legs difficulty walking.  She cannot walk up stairs due to left leg weakness and has noticed gradual progressive weakness in her left arm numbness in her fingers dropping objects.  She has some weakness in her right hand and grip more numbness in the left hand and grip.  She has had numbness bilaterally from the knees down and several times she has bumped her leg and has not noticed it.  She has noticed that her balance is worse she has problems walking and is used a cane.  She has had previous lumbar fusions L3-4 and L4-5 by me in 2018 and later had revision surgery by Dr. Otelia Sergeant with the revision surgery fusion L4-S1.  X-rays of lumbar spine showed no screw loosening and all cages are in good position.  No groin pain no problems with hip range of motion right or left.  Review of Systems  Past problems with lumbar disc degeneration spinal stenosis upper activity.  Past history of chronic pain she has been off all pain medication for 2 years after her mother passed away.  Patient states she has been on medication for restless legs for 2 years.  Objective: Vital Signs: Ht 5' 5.5" (1.664 m)   Wt 220 lb (99.8 kg)   BMI 36.05 kg/m   Physical Exam Constitutional:      Appearance: She is well-developed.  HENT:     Head: Normocephalic.     Right Ear: External ear normal.     Left Ear: External ear normal. There is no impacted cerumen.  Eyes:     Pupils: Pupils are equal, round, and reactive to light.  Neck:     Thyroid: No thyromegaly.     Trachea: No tracheal deviation.  Cardiovascular:     Rate and  Rhythm: Normal rate.  Pulmonary:     Effort: Pulmonary effort is normal.  Abdominal:     Palpations: Abdomen is soft.  Musculoskeletal:     Cervical back: No rigidity.  Skin:    General: Skin is warm and dry.  Neurological:     Mental Status: She is alert and oriented to person, place, and time.  Psychiatric:        Behavior: Behavior normal.     Ortho Exam patient has 3+ biceps triceps brachial radialis on the right 2+ on the left.  He has decreased grip strength left dominant hand.  Trace biceps triceps weakness wrist flexion extension on the left versus right.  Bilateral quad weakness and hip flexion weakness.  Gait is wide-based decreased balance.  Decreased sensation from the knees down.  Lower extremity reflexes are 3+ knee jerk trace ankle jerk no clonus.  Well-healed lumbar incision from previous lumbar surgery.  Patient has some abductor weakness and adductor weakness extremities.  Specialty Comments:  No specialty comments available.  Imaging: XR KNEE 3 VIEW LEFT  Result Date: 04/07/2022 Standing AP both knees lateral left knee sunrise patella x-ray knee x-rays were obtained and compared to 2021 images.  Well-positioned left total knee arthroplasty good alignment no subsidence or loosening.  No soft tissue calcification. Impression: Satisfactory left total knee arthroplasty radiographs.  XR Cervical Spine 2 or 3 views  Result Date: 04/07/2022 2 view x-rays show severe spondylosis with some retrolisthesis at C3-4 2 mm disc space.  Significant anterior posterior spurring at C4-5 C5-6 and milder involvement at C6-7.  Left greater than right facet and uncovertebral changes seen throughout the mid cervical spine on AP radiograph. Impression: Multilevel cervical spondylosis from C3-C7.  Mild retrolisthesis C3-4.   CLINICAL DATA:  Chronic neck pain with difficulty walking. Cervical spondylosis. Possible myelopathy.   EXAM: MRI CERVICAL SPINE WITHOUT CONTRAST    TECHNIQUE: Multiplanar, multisequence MR imaging of the cervical spine was performed. No intravenous contrast was administered.   COMPARISON:  Limited correlation made with cervical spine radiographs 01/11/2021.   FINDINGS: Alignment: Mild cervicothoracic scoliosis. There is a mild degenerative anterolisthesis at C7-T1.   Vertebrae: No acute or suspicious osseous findings. Chronic appearing mild superior endplate compression deformity at T2. Multilevel endplate degenerative changes.   Cord: There is effacement of the CSF surrounding the cord with mild cord compression at the C3-4 and C4-5 levels. The cord is atrophied at those levels with linear T2 hyperintensity bilaterally (snake eyes), consistent with myelomalacia. No evidence of cord expansion or hemorrhage.   Posterior Fossa, vertebral arteries, paraspinal tissues: Visualized portions of the  posterior fossa appear unremarkable.Bilateral vertebral artery flow voids. No significant paraspinal findings.   Disc levels:   C2-3: Mild uncinate spurring and facet hypertrophy without spinal stenosis or nerve root encroachment.   C3-4: Chronic loss of disc height with posterior osteophytes covering diffusely bulging disc material. Effacement of the CSF surrounding the cord with chronic cord compression and myelopathic changes. Severe foraminal narrowing bilaterally.   C4-5: Similar chronic loss of disc height with posterior osteophytes covering diffusely bulging disc material. Effacement of the CSF surrounding the cord with chronic cord compression and myelopathic changes. Severe foraminal narrowing bilaterally.   C5-6: Chronic loss of disc height with posterior osteophytes covering diffusely bulging disc material. Mild spinal stenosis without cord deformity. Moderate osseous foraminal narrowing bilaterally.   C6-7: Mild disc bulging with asymmetric facet hypertrophy on the left. No spinal stenosis or significant foraminal  narrowing.   C7-T1: Disc bulging with moderate bilateral facet hypertrophy accounting for the slight anterolisthesis. Mild to moderate foraminal narrowing bilaterally without cord deformity.   T1-2: Only imaged in the sagittal plane. Disc bulging with a broad-based disc osteophyte complex on the left which causes moderate to severe left foraminal narrowing and possible left T1 nerve root encroachment. Mild indentation of the ventral surface of the cord with ample CSF posteriorly.   T2-3: Only imaged in the sagittal plane. Asymmetric disc bulging and osteophytes on the left contribute to moderate to severe left foraminal narrowing.   IMPRESSION: 1. Multilevel cervical spondylosis similar to prior radiographs. No acute osseous findings identified. 2. At both C3-4 and C4-5, there is chronic cord compression with cord atrophy and myelomalacia. Severe foraminal narrowing is present bilaterally which may affect the exiting nerve roots. 3. Lesser spondylosis at the additional levels as detailed above, without significant central spinal stenosis. Mild-to-moderate osseous foraminal narrowing is present bilaterally at C5-6 and C7-T1. In addition, there is left-sided foraminal narrowing at T1-2 and T2-3, only imaged in the sagittal plane.     Electronically Signed   By: Carey Bullocks M.D.   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    Class: Chronic   S/P lumbar spinal fusion 01/16/2018   Impingement syndrome of left shoulder 12/14/2017   S/P lumbar fusion 01/26/2017   Postoperative urinary retention 01/11/2017   Lumbar stenosis 01/09/2017   Restless legs 11/14/2016   Osteoarthritis 11/14/2016   Mixed anxiety and depressive disorder  11/14/2016   Attention deficit hyperactivity disorder 11/14/2016   Anxiety 11/14/2016   Adult attention deficit hyperactivity disorder 11/14/2016   Osteoarthritis of right knee 10/03/2012   Fibromyalgia syndrome 10/03/2012   Obesity (BMI 30.0-34.9) 10/03/2012   Past Medical History:  Diagnosis Date   ADHD (attention deficit hyperactivity disorder)    Anxiety    "from the fibromyalgia"   Chronic lower back pain    Fibromyalgia    Headache    "bad one; monthly" (01/11/2017)   Osteoarthritis    "all over" (01/11/2017)   Pneumonia    "I've had it about 3 times" (01/11/2017)   PONV (postoperative nausea and vomiting)    Rotator cuff tear    left shoulder    No family history on file.  Past Surgical History:  Procedure Laterality Date   APPENDECTOMY  BACK SURGERY     CARPAL TUNNEL RELEASE Bilateral    CYST EXCISION     behind rt ear,coxxyx   CYSTECTOMY N/A    from spine.    DILATION AND CURETTAGE OF UTERUS     SHOULDER OPEN ROTATOR CUFF REPAIR Bilateral    TONSILLECTOMY     TOTAL KNEE ARTHROPLASTY Right 10/02/2012   Procedure: TOTAL KNEE ARTHROPLASTY;  Surgeon: Valeria Batman, MD;  Location: [facility] OR;  Service: Orthopedics;  Laterality: Right;  Right Total Knee Arthroplasty   TOTAL KNEE ARTHROPLASTY Left 12/25/2019   TOTAL KNEE ARTHROPLASTY Left 12/25/2019   Procedure: LEFT TOTAL KNEE ARTHROPLASTY;  Surgeon: Eldred Manges, MD;  Location: [facility] OR;  Service: Orthopedics;  Laterality: Left;   TRANSFORAMINAL LUMBAR INTERBODY FUSION (TLIF) WITH PEDICLE SCREW FIXATION 2 LEVEL Left 01/09/2017   L3-4, L4-5/notes 01/09/2017   TUBAL LIGATION     VAGINAL HYSTERECTOMY     Social History   Occupational History   Not on file  Tobacco Use   Smoking status: Never   Smokeless tobacco: Never  Vaping Use   Vaping Use: Never used  Substance and Sexual Activity   Alcohol use: No   Drug use: No   Sexual activity: Not Currently

## 2022-04-07 NOTE — Addendum Note (Signed)
Addended by: Eldred Manges on: 04/07/2022 02:33 PM   Modules accepted: Level of Service

## 2022-04-07 NOTE — Progress Notes (Addendum)
Office Visit Note   Patient: Laura Solis           Date of Birth: 1951/02/19           MRN: [id number] Visit Date: 04/07/2022              Requested by: Joaquin Courts, DO [address] [place],  [place] [address] PCP: Joaquin Courts, DO   Assessment & Plan: Visit Diagnoses:  1. Acute pain of left knee   2. Cervical spondylosis     Plan: Patient with bilateral lower extremity weakness upper extremity weakness with severe spondylosis likely stenosis with myelopathic changes.  She has weakness upper and lower extremities with severe spurring her neck.  Patient needs urgent MRI scan to rule out progressive cord compression.  Patient also has pending MRI scan of her lumbar spine with and without contrast and she has had previous multilevel fusion procedure.  Her lumbar MRI scan is not urgent and will be routinely scheduled.  ADDENDUM: MRI scan was done today and I have included below the called report.  Patient has myelopathic changes with severe cord compression at C3-4 and C4-5 with cord atrophy and myelomalacia.  She has some foraminal narrowing at other levels nothing severe.  Plan will be two-level cervical fusion C3-4 C4-5 with allograft and plate overnight stay soft collar x6 weeks.  We discussed should be out of work and we discussed that some of her weakness may be permanent with the cord changes seen in surgery is done urgently to prevent progression of her numbness and weakness in her arms and legs.  Questions elicited and answered she understands and request to proceed.   Follow-Up Instructions: No follow-ups on file.   Orders:  Orders Placed This Encounter  Procedures   XR KNEE 3 VIEW LEFT   MR Cervical Spine w/o contrast   XR Cervical Spine 2 or 3 views   Meds ordered this encounter  Medications   HYDROcodone-acetaminophen (NORCO/VICODIN) 5-325 MG tablet    Sig: Take 1 tablet by mouth every 6 (six) hours as needed for moderate pain.    Dispense:  30  tablet    Refill:  0      Procedures: No procedures performed   Clinical Data: No additional findings.   Subjective: Chief Complaint  Patient presents with   Left Knee - Pain    HPI 71 year old female is here with lower extremity weakness severe pain in the lateral aspect of her left knee.  She was sent for therapy has stopped therapy for the last week due to increasing pain in her legs difficulty walking.  She cannot walk up stairs due to left leg weakness and has noticed gradual progressive weakness in her left arm numbness in her fingers dropping objects.  She has some weakness in her right hand and grip more numbness in the left hand and grip.  She has had numbness bilaterally from the knees down and several times she has bumped her leg and has not noticed it.  She has noticed that her balance is worse she has problems walking and is used a cane.  She has had previous lumbar fusions L3-4 and L4-5 by me in 2018 and later had revision surgery by Dr. Otelia Sergeant with the revision surgery fusion L4-S1.  X-rays of lumbar spine showed no screw loosening and all cages are in good position.  No groin pain no problems with hip range of motion right or left.  Review of Systems  Past problems with lumbar disc degeneration spinal stenosis upper activity.  Past history of chronic pain she has been off all pain medication for 2 years after her mother passed away.  Patient states she has been on medication for restless legs for 2 years.  Objective: Vital Signs: Ht 5' 5.5" (1.664 m)   Wt 220 lb (99.8 kg)   BMI 36.05 kg/m   Physical Exam Constitutional:      Appearance: She is well-developed.  HENT:     Head: Normocephalic.     Right Ear: External ear normal.     Left Ear: External ear normal. There is no impacted cerumen.  Eyes:     Pupils: Pupils are equal, round, and reactive to light.  Neck:     Thyroid: No thyromegaly.     Trachea: No tracheal deviation.  Cardiovascular:     Rate and  Rhythm: Normal rate.  Pulmonary:     Effort: Pulmonary effort is normal.  Abdominal:     Palpations: Abdomen is soft.  Musculoskeletal:     Cervical back: No rigidity.  Skin:    General: Skin is warm and dry.  Neurological:     Mental Status: She is alert and oriented to person, place, and time.  Psychiatric:        Behavior: Behavior normal.     Ortho Exam patient has 3+ biceps triceps brachial radialis on the right 2+ on the left.  He has decreased grip strength left dominant hand.  Trace biceps triceps weakness wrist flexion extension on the left versus right.  Bilateral quad weakness and hip flexion weakness.  Gait is wide-based decreased balance.  Decreased sensation from the knees down.  Lower extremity reflexes are 3+ knee jerk trace ankle jerk no clonus.  Well-healed lumbar incision from previous lumbar surgery.  Patient has some abductor weakness and adductor weakness extremities.  Specialty Comments:  No specialty comments available.  Imaging: XR KNEE 3 VIEW LEFT  Result Date: 04/07/2022 Standing AP both knees lateral left knee sunrise patella x-ray knee x-rays were obtained and compared to 2021 images.  Well-positioned left total knee arthroplasty good alignment no subsidence or loosening.  No soft tissue calcification. Impression: Satisfactory left total knee arthroplasty radiographs.  XR Cervical Spine 2 or 3 views  Result Date: 04/07/2022 2 view x-rays show severe spondylosis with some retrolisthesis at C3-4 2 mm disc space.  Significant anterior posterior spurring at C4-5 C5-6 and milder involvement at C6-7.  Left greater than right facet and uncovertebral changes seen throughout the mid cervical spine on AP radiograph. Impression: Multilevel cervical spondylosis from C3-C7.  Mild retrolisthesis C3-4.   CLINICAL DATA:  Chronic neck pain with difficulty walking. Cervical spondylosis. Possible myelopathy.   EXAM: MRI CERVICAL SPINE WITHOUT CONTRAST    TECHNIQUE: Multiplanar, multisequence MR imaging of the cervical spine was performed. No intravenous contrast was administered.   COMPARISON:  Limited correlation made with cervical spine radiographs 01/11/2021.   FINDINGS: Alignment: Mild cervicothoracic scoliosis. There is a mild degenerative anterolisthesis at C7-T1.   Vertebrae: No acute or suspicious osseous findings. Chronic appearing mild superior endplate compression deformity at T2. Multilevel endplate degenerative changes.   Cord: There is effacement of the CSF surrounding the cord with mild cord compression at the C3-4 and C4-5 levels. The cord is atrophied at those levels with linear T2 hyperintensity bilaterally (snake eyes), consistent with myelomalacia. No evidence of cord expansion or hemorrhage.   Posterior Fossa, vertebral arteries, paraspinal tissues: Visualized portions of the  posterior fossa appear unremarkable.Bilateral vertebral artery flow voids. No significant paraspinal findings.   Disc levels:   C2-3: Mild uncinate spurring and facet hypertrophy without spinal stenosis or nerve root encroachment.   C3-4: Chronic loss of disc height with posterior osteophytes covering diffusely bulging disc material. Effacement of the CSF surrounding the cord with chronic cord compression and myelopathic changes. Severe foraminal narrowing bilaterally.   C4-5: Similar chronic loss of disc height with posterior osteophytes covering diffusely bulging disc material. Effacement of the CSF surrounding the cord with chronic cord compression and myelopathic changes. Severe foraminal narrowing bilaterally.   C5-6: Chronic loss of disc height with posterior osteophytes covering diffusely bulging disc material. Mild spinal stenosis without cord deformity. Moderate osseous foraminal narrowing bilaterally.   C6-7: Mild disc bulging with asymmetric facet hypertrophy on the left. No spinal stenosis or significant foraminal  narrowing.   C7-T1: Disc bulging with moderate bilateral facet hypertrophy accounting for the slight anterolisthesis. Mild to moderate foraminal narrowing bilaterally without cord deformity.   T1-2: Only imaged in the sagittal plane. Disc bulging with a broad-based disc osteophyte complex on the left which causes moderate to severe left foraminal narrowing and possible left T1 nerve root encroachment. Mild indentation of the ventral surface of the cord with ample CSF posteriorly.   T2-3: Only imaged in the sagittal plane. Asymmetric disc bulging and osteophytes on the left contribute to moderate to severe left foraminal narrowing.   IMPRESSION: 1. Multilevel cervical spondylosis similar to prior radiographs. No acute osseous findings identified. 2. At both C3-4 and C4-5, there is chronic cord compression with cord atrophy and myelomalacia. Severe foraminal narrowing is present bilaterally which may affect the exiting nerve roots. 3. Lesser spondylosis at the additional levels as detailed above, without significant central spinal stenosis. Mild-to-moderate osseous foraminal narrowing is present bilaterally at C5-6 and C7-T1. In addition, there is left-sided foraminal narrowing at T1-2 and T2-3, only imaged in the sagittal plane.     Electronically Signed   By: Carey Bullocks M.D.   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    Class: Chronic   S/P lumbar spinal fusion 01/16/2018   Impingement syndrome of left shoulder 12/14/2017   S/P lumbar fusion 01/26/2017   Postoperative urinary retention 01/11/2017   Lumbar stenosis 01/09/2017   Restless legs 11/14/2016   Osteoarthritis 11/14/2016   Mixed anxiety and depressive disorder  11/14/2016   Attention deficit hyperactivity disorder 11/14/2016   Anxiety 11/14/2016   Adult attention deficit hyperactivity disorder 11/14/2016   Osteoarthritis of right knee 10/03/2012   Fibromyalgia syndrome 10/03/2012   Obesity (BMI 30.0-34.9) 10/03/2012   Past Medical History:  Diagnosis Date   ADHD (attention deficit hyperactivity disorder)    Anxiety    "from the fibromyalgia"   Chronic lower back pain    Fibromyalgia    Headache    "bad one; monthly" (01/11/2017)   Osteoarthritis    "all over" (01/11/2017)   Pneumonia    "I've had it about 3 times" (01/11/2017)   PONV (postoperative nausea and vomiting)    Rotator cuff tear    left shoulder    No family history on file.  Past Surgical History:  Procedure Laterality Date   APPENDECTOMY  BACK SURGERY     CARPAL TUNNEL RELEASE Bilateral    CYST EXCISION     behind rt ear,coxxyx   CYSTECTOMY N/A    from spine.    DILATION AND CURETTAGE OF UTERUS     SHOULDER OPEN ROTATOR CUFF REPAIR Bilateral    TONSILLECTOMY     TOTAL KNEE ARTHROPLASTY Right 10/02/2012   Procedure: TOTAL KNEE ARTHROPLASTY;  Surgeon: Valeria Batman, MD;  Location: [facility] OR;  Service: Orthopedics;  Laterality: Right;  Right Total Knee Arthroplasty   TOTAL KNEE ARTHROPLASTY Left 12/25/2019   TOTAL KNEE ARTHROPLASTY Left 12/25/2019   Procedure: LEFT TOTAL KNEE ARTHROPLASTY;  Surgeon: Eldred Manges, MD;  Location: [facility] OR;  Service: Orthopedics;  Laterality: Left;   TRANSFORAMINAL LUMBAR INTERBODY FUSION (TLIF) WITH PEDICLE SCREW FIXATION 2 LEVEL Left 01/09/2017   L3-4, L4-5/notes 01/09/2017   TUBAL LIGATION     VAGINAL HYSTERECTOMY     Social History   Occupational History   Not on file  Tobacco Use   Smoking status: Never   Smokeless tobacco: Never  Vaping Use   Vaping Use: Never used  Substance and Sexual Activity   Alcohol use: No   Drug use: No   Sexual activity: Not Currently

## 2022-04-11 ENCOUNTER — Other Ambulatory Visit: Payer: Self-pay

## 2022-04-11 ENCOUNTER — Encounter (HOSPITAL_COMMUNITY): Payer: Self-pay

## 2022-04-11 ENCOUNTER — Encounter (HOSPITAL_COMMUNITY)
Admission: RE | Admit: 2022-04-11 | Discharge: 2022-04-11 | Disposition: A | Discharge status: Home / Self Care | Payer: Medicare Other | Source: Ambulatory Visit | Attending: Orthopaedic Surgery | Admitting: Orthopaedic Surgery

## 2022-04-11 VITALS — BP 154/81 | HR 79 | Temp 98.6°F | Resp 18 | Ht 64.0 in | Wt 221.8 lb

## 2022-04-11 DIAGNOSIS — Z01818 Encounter for other preprocedural examination: Secondary | ICD-10-CM | POA: Diagnosis present

## 2022-04-11 LAB — CBC
HCT: 40.7 % (ref 36.0–46.0)
Hemoglobin: 13.4 g/dL (ref 12.0–15.0)
MCH: 32.4 pg (ref 26.0–34.0)
MCHC: 32.9 g/dL (ref 30.0–36.0)
MCV: 98.3 fL (ref 80.0–100.0)
Platelets: 215 10*3/uL (ref 150–400)
RBC: 4.14 MIL/uL (ref 3.87–5.11)
RDW: 13 % (ref 11.5–15.5)
WBC: 6.1 10*3/uL (ref 4.0–10.5)
nRBC: 0 % (ref 0.0–0.2)

## 2022-04-11 LAB — SURGICAL PCR SCREEN
MRSA, PCR: NEGATIVE
Staphylococcus aureus: NEGATIVE

## 2022-04-11 LAB — TYPE AND SCREEN
ABO/RH(D): A POS
Antibody Screen: NEGATIVE

## 2022-04-11 NOTE — Pre-Procedure Instructions (Addendum)
Laura Solis  04/11/2022    Your procedure is scheduled on Wednesday, September 13.  Report to [facility], Main Entrance or Entrance "A" at 12:55 pm                Your surgery or procedure is scheduled to begin at 2:55   Call this number if you have problems the morning of surgery: [phone]  This is the number for the Pre- Surgical Desk.                For any other questions, please call [phone], Monday - Friday 8 AM - 4 PM.   Remember:  Do not eat  after midnight.  You may drink clear liquids until 11:45 .  Clear liquids allowed are:          Water, Juice (No red color; non-citric and without pulp; diabetics please choose diet or no sugar options), Carbonated beverages (diabetics please choose diet or no sugar options), Clear Tea (No creamer, milk, or cream, including half & half and powdered creamer), Black Coffee Only (No creamer, milk or cream, including half & half and powdered creamer), Plain Jell-O Only (No red color; diabetics please choose no sugar options), Clear Sports drink (No red color; diabetics please choose diet or no sugar options), and Plain Popsicles Only (No red color; diabetics please choose no sugar options)  Drink the bottle of Pre- OP Ensure between 11 and 11:45, then nothing else to drink    Take these medicines the morning of surgery with A SIP OF WATER : valACYclovir (VALTREX)   MayTake Hydrocone - Acetamimopen if needed.  STOP taking Aspirin, Aspirin Products (Goody Powder, Excedrin Migraine),  Ibuprofen (Advil), Naproxen (Aleve), Vitamins and Herbal Products (ie Fish Oil).    Special instructions:    [place]- Preparing For Surgery  Before surgery, you can play an important role. Because skin is not sterile, your skin needs to be as free of germs as possible. You can reduce the number of germs on your skin by washing with CHG (chlorahexidine gluconate) Soap before surgery.  CHG is an antiseptic cleaner which kills germs and  bonds with the skin to continue killing germs even after washing.    Oral Hygiene is also important to reduce your risk of infection.  Remember - BRUSH YOUR TEETH THE MORNING OF SURGERY WITH YOUR REGULAR TOOTHPASTE  Please do not use if you have an allergy to CHG or antibacterial soaps. If your skin becomes reddened/irritated stop using the CHG.  Do not shave (including legs and underarms) for at least 48 hours prior to first CHG shower. It is OK to shave your face.  Please follow these instructions carefully.   Shower the NIGHT BEFORE SURGERY and the MORNING OF SURGERY with CHG.   If you chose to wash your hair, wash your hair first as usual with your normal shampoo.  After you shampoo, wash your face and private area with the soap you use at home, then rinse your hair and body thoroughly to remove the shampoo and soap.  Use CHG as you would any other liquid soap. You can apply CHG directly to the skin and wash gently with a scrungie or a clean washcloth.   Apply the CHG Soap to your body ONLY FROM THE NECK DOWN.  Do not use on open wounds or open sores. Avoid contact with your eyes, ears, mouth and genitals (private parts).   Wash thoroughly, paying special attention  to the area where your surgery will be performed.  Thoroughly rinse your body with warm water from the neck down.  DO NOT shower/wash with your normal soap after using and rinsing off the CHG Soap.  Pat yourself dry with a CLEAN TOWEL.  Wear CLEAN PAJAMAS to bed the night before surgery, wear comfortable clothes the morning of surgery  Place CLEAN SHEETS on your bed the night of your first shower and DO NOT SLEEP WITH PETS.  Day of Surgery: Shower as instructed above. Do not apply any deodorants/lotions, powders or colognes.  Please wear clean clothes to the hospital/surgery center.   Remember to brush your teeth WITH YOUR REGULAR TOOTHPASTE.  Do not wear jewelry, make-up or nail polish.  Do not shave 48 hours  prior to surgery.  Men may shave face and neck.  Do not bring valuables to the hospital.  [facility] is not responsible for any belongings or valuables.  Contacts, dentures or bridgework may not be worn into surgery.  Leave your suitcase in the car.  After surgery it may be brought to your room.  For patients admitted to the hospital, discharge time will be determined by your treatment team.  Patients discharged the day of surgery will not be allowed to drive home.   Please read over the fact sheets that you were given.

## 2022-04-11 NOTE — Progress Notes (Signed)
PCP - Dr. Joaquin Courts  Cardiologist - No   Chest x-ray - na  EKG - na  Stress Test - NA  ECHO - NA  Cardiac Cath - no  AICD-na PM-na LOOP-na  Sleep Study - yes CPAP - no  LABS-  ASA na  ERAS yes plus Pre op Ensure- to stop fluids at 0915  HA1C-na na Fasting Blood Sugar - na Checks Blood Sugar _na__ times a day  Anesthesia-  Pt denies having chest pain, sob, or fever at this time. All instructions explained to the pt, with a verbal understanding of the material. Pt agrees to go over the instructions while at home for a better understanding. Pt also instructed to self quarantine after being tested for COVID-19. The opportunity to ask questions was provided.

## 2022-04-13 ENCOUNTER — Observation Stay (HOSPITAL_COMMUNITY)
Admission: RE | Admit: 2022-04-13 | Discharge: 2022-04-14 | Disposition: A | Discharge status: Home / Self Care | Payer: Medicare Other | Attending: Orthopaedic Surgery | Admitting: Orthopaedic Surgery

## 2022-04-13 ENCOUNTER — Ambulatory Visit (HOSPITAL_COMMUNITY): Payer: Medicare Other | Admitting: Certified Registered"

## 2022-04-13 ENCOUNTER — Encounter (HOSPITAL_COMMUNITY): Payer: Self-pay | Admitting: Orthopaedic Surgery

## 2022-04-13 ENCOUNTER — Ambulatory Visit (HOSPITAL_BASED_OUTPATIENT_CLINIC_OR_DEPARTMENT_OTHER): Payer: Medicare Other | Admitting: Certified Registered"

## 2022-04-13 ENCOUNTER — Other Ambulatory Visit: Payer: Self-pay

## 2022-04-13 ENCOUNTER — Ambulatory Visit (HOSPITAL_COMMUNITY): Payer: Medicare Other

## 2022-04-13 ENCOUNTER — Encounter (HOSPITAL_COMMUNITY)
Admission: RE | Disposition: A | Discharge status: Home / Self Care | Payer: Self-pay | Source: Home / Self Care | Attending: Orthopaedic Surgery

## 2022-04-13 DIAGNOSIS — G9589 Other specified diseases of spinal cord: Secondary | ICD-10-CM | POA: Diagnosis not present

## 2022-04-13 DIAGNOSIS — Z96653 Presence of artificial knee joint, bilateral: Secondary | ICD-10-CM | POA: Insufficient documentation

## 2022-04-13 DIAGNOSIS — M48061 Spinal stenosis, lumbar region without neurogenic claudication: Secondary | ICD-10-CM

## 2022-04-13 DIAGNOSIS — M25562 Pain in left knee: Secondary | ICD-10-CM | POA: Diagnosis not present

## 2022-04-13 DIAGNOSIS — G992 Myelopathy in diseases classified elsewhere: Secondary | ICD-10-CM | POA: Diagnosis not present

## 2022-04-13 DIAGNOSIS — M4802 Spinal stenosis, cervical region: Secondary | ICD-10-CM | POA: Diagnosis present

## 2022-04-13 DIAGNOSIS — Z01818 Encounter for other preprocedural examination: Secondary | ICD-10-CM

## 2022-04-13 HISTORY — PX: ANTERIOR CERVICAL DECOMP/DISCECTOMY FUSION: SHX1161

## 2022-04-13 SURGERY — ANTERIOR CERVICAL DECOMPRESSION/DISCECTOMY FUSION 2 LEVELS
Anesthesia: General | Site: Spine Cervical

## 2022-04-13 MED ORDER — PHENOL 1.4 % MT LIQD: 1.0000 | OROMUCOSAL | Status: DC | PRN

## 2022-04-13 MED ORDER — METHOCARBAMOL 500 MG PO TABS
500.0000 mg | ORAL_TABLET | Freq: Four times a day (QID) | ORAL | Status: DC | PRN
  Administered 2022-04-13: 500 mg via ORAL

## 2022-04-13 MED ORDER — MIDAZOLAM HCL 2 MG/2ML IJ SOLN
INTRAMUSCULAR | Status: AC
  Filled 2022-04-13: qty 2

## 2022-04-13 MED ORDER — DEXAMETHASONE SODIUM PHOSPHATE 10 MG/ML IJ SOLN
INTRAMUSCULAR | Status: DC | PRN
  Administered 2022-04-13: 5 mg via INTRAVENOUS

## 2022-04-13 MED ORDER — LIDOCAINE 2% (20 MG/ML) 5 ML SYRINGE
INTRAMUSCULAR | Status: AC
  Filled 2022-04-13: qty 5

## 2022-04-13 MED ORDER — HYDROMORPHONE HCL 1 MG/ML IJ SOLN: 0.5000 mg | INTRAMUSCULAR | Status: DC | PRN

## 2022-04-13 MED ORDER — CHLORHEXIDINE GLUCONATE 0.12 % MT SOLN
15.0000 mL | Freq: Once | OROMUCOSAL | Status: AC
  Administered 2022-04-13: 15 mL via OROMUCOSAL
  Filled 2022-04-13: qty 15

## 2022-04-13 MED ORDER — ROCURONIUM BROMIDE 10 MG/ML (PF) SYRINGE
PREFILLED_SYRINGE | INTRAVENOUS | Status: AC
  Filled 2022-04-13: qty 10

## 2022-04-13 MED ORDER — DEXAMETHASONE SODIUM PHOSPHATE 10 MG/ML IJ SOLN
INTRAMUSCULAR | Status: AC
  Filled 2022-04-13: qty 1

## 2022-04-13 MED ORDER — ONDANSETRON HCL 4 MG/2ML IJ SOLN
INTRAMUSCULAR | Status: AC
  Filled 2022-04-13: qty 2

## 2022-04-13 MED ORDER — LIDOCAINE 2% (20 MG/ML) 5 ML SYRINGE
INTRAMUSCULAR | Status: DC | PRN
  Administered 2022-04-13: 100 mg via INTRAVENOUS

## 2022-04-13 MED ORDER — OXYCODONE HCL 5 MG PO TABS
ORAL_TABLET | ORAL | Status: AC
  Filled 2022-04-13: qty 1

## 2022-04-13 MED ORDER — CEFAZOLIN SODIUM-DEXTROSE 2-4 GM/100ML-% IV SOLN
2.0000 g | INTRAVENOUS | Status: AC
  Administered 2022-04-13: 2 g via INTRAVENOUS
  Filled 2022-04-13: qty 100

## 2022-04-13 MED ORDER — FENTANYL CITRATE (PF) 250 MCG/5ML IJ SOLN
INTRAMUSCULAR | Status: AC
  Filled 2022-04-13: qty 5

## 2022-04-13 MED ORDER — FLUOXETINE HCL 20 MG PO CAPS
20.0000 mg | ORAL_CAPSULE | Freq: Every day | ORAL | Status: DC
  Administered 2022-04-13: 20 mg via ORAL
  Filled 2022-04-13: qty 1

## 2022-04-13 MED ORDER — BUPIVACAINE HCL (PF) 0.25 % IJ SOLN
INTRAMUSCULAR | Status: AC
  Filled 2022-04-13: qty 30

## 2022-04-13 MED ORDER — ROCURONIUM BROMIDE 10 MG/ML (PF) SYRINGE
PREFILLED_SYRINGE | INTRAVENOUS | Status: DC | PRN
  Administered 2022-04-13: 70 mg via INTRAVENOUS
  Administered 2022-04-13: 20 mg via INTRAVENOUS
  Administered 2022-04-13: 10 mg via INTRAVENOUS

## 2022-04-13 MED ORDER — PHENYLEPHRINE 80 MCG/ML (10ML) SYRINGE FOR IV PUSH (FOR BLOOD PRESSURE SUPPORT)
PREFILLED_SYRINGE | INTRAVENOUS | Status: DC | PRN
  Administered 2022-04-13 (×2): 80 ug via INTRAVENOUS
  Administered 2022-04-13: 160 ug via INTRAVENOUS

## 2022-04-13 MED ORDER — PROPOFOL 10 MG/ML IV BOLUS
INTRAVENOUS | Status: AC
  Filled 2022-04-13: qty 20

## 2022-04-13 MED ORDER — SODIUM CHLORIDE 0.9% FLUSH
3.0000 mL | Freq: Two times a day (BID) | INTRAVENOUS | Status: DC
  Administered 2022-04-13: 3 mL via INTRAVENOUS

## 2022-04-13 MED ORDER — ACETAMINOPHEN 10 MG/ML IV SOLN
INTRAVENOUS | Status: AC
  Filled 2022-04-13: qty 100

## 2022-04-13 MED ORDER — PRAMIPEXOLE DIHYDROCHLORIDE 0.25 MG PO TABS
1.5000 mg | ORAL_TABLET | Freq: Every day | ORAL | Status: DC
  Administered 2022-04-13: 1.5 mg via ORAL
  Filled 2022-04-13: qty 6

## 2022-04-13 MED ORDER — GABAPENTIN 300 MG PO CAPS
300.0000 mg | ORAL_CAPSULE | Freq: Every day | ORAL | Status: DC
  Administered 2022-04-13: 300 mg via ORAL
  Filled 2022-04-13: qty 1

## 2022-04-13 MED ORDER — DOCUSATE SODIUM 100 MG PO CAPS
100.0000 mg | ORAL_CAPSULE | Freq: Two times a day (BID) | ORAL | Status: DC
  Administered 2022-04-13 – 2022-04-14 (×2): 100 mg via ORAL
  Filled 2022-04-13 (×2): qty 1

## 2022-04-13 MED ORDER — SODIUM CHLORIDE 0.9% FLUSH: 3.0000 mL | INTRAVENOUS | Status: DC | PRN

## 2022-04-13 MED ORDER — ORAL CARE MOUTH RINSE: 15.0000 mL | Freq: Once | OROMUCOSAL | Status: AC

## 2022-04-13 MED ORDER — AMPHETAMINE-DEXTROAMPHETAMINE 10 MG PO TABS
10.0000 mg | ORAL_TABLET | Freq: Two times a day (BID) | ORAL | Status: DC
  Administered 2022-04-14: 10 mg via ORAL
  Filled 2022-04-13 (×3): qty 1

## 2022-04-13 MED ORDER — FENTANYL CITRATE (PF) 100 MCG/2ML IJ SOLN
25.0000 ug | INTRAMUSCULAR | Status: DC | PRN
  Administered 2022-04-13: 50 ug via INTRAVENOUS

## 2022-04-13 MED ORDER — ONDANSETRON HCL 4 MG/2ML IJ SOLN: 4.0000 mg | Freq: Four times a day (QID) | INTRAMUSCULAR | Status: DC | PRN

## 2022-04-13 MED ORDER — PREDNISOLONE ACETATE 1 % OP SUSP: 1.0000 [drp] | Freq: Every day | OPHTHALMIC | Status: DC

## 2022-04-13 MED ORDER — SUGAMMADEX SODIUM 200 MG/2ML IV SOLN
INTRAVENOUS | Status: DC | PRN
  Administered 2022-04-13: 200 mg via INTRAVENOUS

## 2022-04-13 MED ORDER — POLYETHYLENE GLYCOL 3350 17 G PO PACK
17.0000 g | PACK | Freq: Every day | ORAL | Status: DC
  Administered 2022-04-13 – 2022-04-14 (×2): 17 g via ORAL
  Filled 2022-04-13 (×2): qty 1

## 2022-04-13 MED ORDER — ACETAMINOPHEN 325 MG PO TABS: 650.0000 mg | ORAL_TABLET | ORAL | Status: DC | PRN

## 2022-04-13 MED ORDER — LACTATED RINGERS IV SOLN: INTRAVENOUS | Status: DC | PRN

## 2022-04-13 MED ORDER — OXYCODONE HCL 5 MG PO TABS
5.0000 mg | ORAL_TABLET | ORAL | Status: DC | PRN
  Administered 2022-04-14 (×2): 2.5 mg via ORAL
  Filled 2022-04-13 (×2): qty 1

## 2022-04-13 MED ORDER — ACETAMINOPHEN 10 MG/ML IV SOLN
INTRAVENOUS | Status: DC | PRN
  Administered 2022-04-13: 1000 mg via INTRAVENOUS

## 2022-04-13 MED ORDER — PROPOFOL 500 MG/50ML IV EMUL
INTRAVENOUS | Status: DC | PRN
  Administered 2022-04-13: 25 ug/kg/min via INTRAVENOUS

## 2022-04-13 MED ORDER — EPHEDRINE SULFATE-NACL 50-0.9 MG/10ML-% IV SOSY
PREFILLED_SYRINGE | INTRAVENOUS | Status: DC | PRN
  Administered 2022-04-13: 5 mg via INTRAVENOUS

## 2022-04-13 MED ORDER — MIDAZOLAM HCL 2 MG/2ML IJ SOLN
INTRAMUSCULAR | Status: DC | PRN
  Administered 2022-04-13 (×2): 1 mg via INTRAVENOUS

## 2022-04-13 MED ORDER — BUPIVACAINE HCL 0.25 % IJ SOLN
INTRAMUSCULAR | Status: DC | PRN
  Administered 2022-04-13: 6 mL

## 2022-04-13 MED ORDER — PROPOFOL 10 MG/ML IV BOLUS
INTRAVENOUS | Status: DC | PRN
  Administered 2022-04-13: 180 mg via INTRAVENOUS
  Administered 2022-04-13: 30 mg via INTRAVENOUS

## 2022-04-13 MED ORDER — EPHEDRINE 5 MG/ML INJ
INTRAVENOUS | Status: AC
  Filled 2022-04-13: qty 5

## 2022-04-13 MED ORDER — ONDANSETRON HCL 4 MG/2ML IJ SOLN
INTRAMUSCULAR | Status: DC | PRN
  Administered 2022-04-13: 4 mg via INTRAVENOUS

## 2022-04-13 MED ORDER — OXYCODONE HCL 5 MG/5ML PO SOLN: 5.0000 mg | Freq: Once | ORAL | Status: AC | PRN

## 2022-04-13 MED ORDER — METHOCARBAMOL 500 MG PO TABS
ORAL_TABLET | ORAL | Status: AC
  Filled 2022-04-13: qty 1

## 2022-04-13 MED ORDER — PHENYLEPHRINE 80 MCG/ML (10ML) SYRINGE FOR IV PUSH (FOR BLOOD PRESSURE SUPPORT)
PREFILLED_SYRINGE | INTRAVENOUS | Status: AC
  Filled 2022-04-13: qty 10

## 2022-04-13 MED ORDER — FENTANYL CITRATE (PF) 250 MCG/5ML IJ SOLN
INTRAMUSCULAR | Status: DC | PRN
  Administered 2022-04-13 (×2): 50 ug via INTRAVENOUS
  Administered 2022-04-13: 100 ug via INTRAVENOUS

## 2022-04-13 MED ORDER — ACETAMINOPHEN 650 MG RE SUPP: 650.0000 mg | RECTAL | Status: DC | PRN

## 2022-04-13 MED ORDER — SODIUM CHLORIDE 0.9 % IV SOLN: 250.0000 mL | INTRAVENOUS | Status: DC

## 2022-04-13 MED ORDER — SODIUM CHLORIDE 0.9 % IV SOLN: INTRAVENOUS | Status: DC

## 2022-04-13 MED ORDER — LACTATED RINGERS IV SOLN: INTRAVENOUS | Status: DC

## 2022-04-13 MED ORDER — METHOCARBAMOL 1000 MG/10ML IJ SOLN: 500.0000 mg | Freq: Four times a day (QID) | INTRAVENOUS | Status: DC | PRN

## 2022-04-13 MED ORDER — ONDANSETRON HCL 4 MG PO TABS: 4.0000 mg | ORAL_TABLET | Freq: Four times a day (QID) | ORAL | Status: DC | PRN

## 2022-04-13 MED ORDER — ALPRAZOLAM 0.5 MG PO TABS
0.5000 mg | ORAL_TABLET | Freq: Every day | ORAL | Status: DC
  Administered 2022-04-13: 0.5 mg via ORAL
  Filled 2022-04-13: qty 1

## 2022-04-13 MED ORDER — OXYCODONE HCL 5 MG PO TABS
5.0000 mg | ORAL_TABLET | Freq: Once | ORAL | Status: AC | PRN
  Administered 2022-04-13: 5 mg via ORAL

## 2022-04-13 MED ORDER — SURGIFLO WITH THROMBIN (HEMOSTATIC MATRIX KIT) OPTIME
TOPICAL | Status: DC | PRN
  Administered 2022-04-13: 1 via TOPICAL

## 2022-04-13 MED ORDER — PHENYLEPHRINE HCL-NACL 20-0.9 MG/250ML-% IV SOLN
INTRAVENOUS | Status: DC | PRN
  Administered 2022-04-13: 60 ug/min via INTRAVENOUS

## 2022-04-13 MED ORDER — 0.9 % SODIUM CHLORIDE (POUR BTL) OPTIME
TOPICAL | Status: DC | PRN
  Administered 2022-04-13: 1000 mL

## 2022-04-13 MED ORDER — FENTANYL CITRATE (PF) 100 MCG/2ML IJ SOLN
INTRAMUSCULAR | Status: AC
  Filled 2022-04-13: qty 2

## 2022-04-13 MED ORDER — MENTHOL 3 MG MT LOZG: 1.0000 | LOZENGE | OROMUCOSAL | Status: DC | PRN

## 2022-04-13 MED ORDER — ACETAMINOPHEN 500 MG PO TABS: 1000.0000 mg | ORAL_TABLET | Freq: Once | ORAL | Status: DC

## 2022-04-13 SURGICAL SUPPLY — 56 items
BAG COUNTER SPONGE SURGICOUNT (BAG) ×1 IMPLANT
BENZOIN TINCTURE AMPULE (MISCELLANEOUS) IMPLANT
BENZOIN TINCTURE PRP APPL 2/3 (GAUZE/BANDAGES/DRESSINGS) ×1 IMPLANT
BIT DRILL SM SPINE QC 12 (BIT) IMPLANT
BLADE CLIPPER SURG (BLADE) IMPLANT
BONE CC-ACS 11X14X7 6D (Bone Implant) ×2 IMPLANT
BUR ROUND FLUTED 4 SOFT TCH (BURR) ×1 IMPLANT
CHIPS BONE CANC-ACS11X14X7 6D (Bone Implant) IMPLANT
COLLAR CERV LO CONTOUR FIRM DE (SOFTGOODS) IMPLANT
CORD BIPOLAR FORCEPS 12FT (ELECTRODE) ×1 IMPLANT
COVER MAYO STAND STRL (DRAPES) IMPLANT
COVER SURGICAL LIGHT HANDLE (MISCELLANEOUS) ×1 IMPLANT
DRAPE C-ARM 42X72 X-RAY (DRAPES) ×1 IMPLANT
DRAPE HALF SHEET 40X57 (DRAPES) ×1 IMPLANT
DRAPE MICROSCOPE SLANT 54X150 (MISCELLANEOUS) ×1 IMPLANT
DURAPREP 6ML APPLICATOR 50/CS (WOUND CARE) ×1 IMPLANT
ELECT COATED BLADE 2.86 ST (ELECTRODE) ×1 IMPLANT
ELECT REM PT RETURN 9FT ADLT (ELECTROSURGICAL) ×1
ELECTRODE REM PT RTRN 9FT ADLT (ELECTROSURGICAL) ×1 IMPLANT
EVACUATOR 1/8 PVC DRAIN (DRAIN) ×1 IMPLANT
GAUZE SPONGE 4X4 12PLY STRL (GAUZE/BANDAGES/DRESSINGS) ×1 IMPLANT
GLOVE BIOGEL PI IND STRL 8 (GLOVE) ×2 IMPLANT
GLOVE ORTHO TXT STRL SZ7.5 (GLOVE) ×2 IMPLANT
GOWN STRL REUS W/ TWL LRG LVL3 (GOWN DISPOSABLE) ×1 IMPLANT
GOWN STRL REUS W/ TWL XL LVL3 (GOWN DISPOSABLE) ×1 IMPLANT
GOWN STRL REUS W/TWL 2XL LVL3 (GOWN DISPOSABLE) ×1 IMPLANT
GOWN STRL REUS W/TWL LRG LVL3 (GOWN DISPOSABLE) ×1
GOWN STRL REUS W/TWL XL LVL3 (GOWN DISPOSABLE) ×1
HALTER HD/CHIN CERV TRACTION D (MISCELLANEOUS) ×1 IMPLANT
HEMOSTAT SURGICEL 2X14 (HEMOSTASIS) IMPLANT
KIT BASIN OR (CUSTOM PROCEDURE TRAY) ×1 IMPLANT
KIT TURNOVER KIT B (KITS) ×1 IMPLANT
MANIFOLD NEPTUNE II (INSTRUMENTS) IMPLANT
NDL 25GX 5/8IN NON SAFETY (NEEDLE) ×1 IMPLANT
NEEDLE 25GX 5/8IN NON SAFETY (NEEDLE) ×1 IMPLANT
NS IRRIG 1000ML POUR BTL (IV SOLUTION) ×1 IMPLANT
PACK ORTHO CERVICAL (CUSTOM PROCEDURE TRAY) ×1 IMPLANT
PAD ARMBOARD 7.5X6 YLW CONV (MISCELLANEOUS) ×2 IMPLANT
PATTIES SURGICAL .5 X.5 (GAUZE/BANDAGES/DRESSINGS) IMPLANT
PIN TEMP FIXATION KIRSCHNER (EXFIX) IMPLANT
PLATE ANT CERV XTEND 2 LV 30 (Plate) IMPLANT
POSITIONER HEAD DONUT 9IN (MISCELLANEOUS) ×1 IMPLANT
RESTRAINT LIMB HOLDER UNIV (RESTRAINTS) IMPLANT
SCREW XTD VAR 4.2 SELF TAP 12 (Screw) IMPLANT
SPONGE GAUZE 4X4 12PLY (GAUZE/BANDAGES/DRESSINGS) IMPLANT
STRIP CLOSURE SKIN 1/2X4 (GAUZE/BANDAGES/DRESSINGS) ×1 IMPLANT
SURGIFLO W/THROMBIN 8M KIT (HEMOSTASIS) IMPLANT
SUT BONE WAX W31G (SUTURE) ×1 IMPLANT
SUT VIC AB 3-0 X1 27 (SUTURE) ×1 IMPLANT
SUT VIC AB 4-0 PS2 18 (SUTURE) IMPLANT
SUT VICRYL 4-0 PS2 18IN ABS (SUTURE) ×2 IMPLANT
TAPE STRIPS DRAPE STRL (GAUZE/BANDAGES/DRESSINGS) IMPLANT
TOWEL GREEN STERILE (TOWEL DISPOSABLE) ×1 IMPLANT
TOWEL GREEN STERILE FF (TOWEL DISPOSABLE) ×1 IMPLANT
TRAY FOLEY W/BAG SLVR 16FR (SET/KITS/TRAYS/PACK)
TRAY FOLEY W/BAG SLVR 16FR ST (SET/KITS/TRAYS/PACK) IMPLANT

## 2022-04-13 NOTE — Op Note (Addendum)
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  Postop diagnosis: Same  Procedure: C3-4, C4-5 anterior cervical discectomy and fusion, allograft and plate.  Surgeon: Annell Greening, MD  Assistant: Zonia Kief, PA-C medically necessary and present for exposure core decompression microscopically and necessary for retraction and instrumentation.  Anesthesia: General orotracheal +6 cc skin local at end of case.  Drains: 1 Hemovac neck.  Implants:Implants  BONE CC-ACS [id number] - [id number]  Inventory Item: BONE CC-ACS [id number] 6D Serial no.: [id number] Model/Cat no.: [id number]  Implant name: BONE CC-ACS [id number] 6D - [id number] Laterality: N/A Area: Spine Cervical  Manufacturer: Tobie Poet FNDN Date of Manufacture:    Action: Implanted Number Used: 1   Device Identifier:  Device Identifier Type:     BONE CC-ACS [id number] 6D - [id number]  Inventory Item: BONE CC-ACS [id number] 6D Serial no.: [id number] Model/Cat no.: [id number]  Implant name: BONE CC-ACS [id number] 6D - [id number] Laterality: N/A Area: Spine Cervical  Manufacturer: Tobie Poet FNDN Date of Manufacture:    Action: Implanted Number Used: 1   Device Identifier:  Device Identifier Type:     PLATE ANT CERV XTEND 2 LV 30 - [id number]  Inventory Item: PLATE ANT CERV XTEND 2 LV 30 Serial no.: [id number] Model/Cat no.: [id number]  Implant name: PLATE ANT CERV XTEND 2 LV 30 - [id number] Laterality: N/A Area: Spine Cervical  Manufacturer: [employer] Date of Manufacture:    Action: Implanted Number Used: 1   Device Identifier:  Device Identifier Type:     SCREW XTD VAR 4.2 SELF TAP 12 - [id number]  Inventory Item: SCREW XTD VAR 4.2 SELF TAP 12 Serial no.: [id number] Model/Cat no.: [id number]  Implant name: SCREW XTD VAR 4.2 SELF TAP 12 - [id number] Laterality: N/A Area: Spine Cervical  Manufacturer: [employer] Date of Manufacture:    Action:  Implanted Number Used: 6   Device Identifier:  Device Identifier Type:      Brief history: Long-term patient who presented a few weeks ago with progressive gait disturbance weakness, numbness in the upper extremities ,weakness in her legs with falling, progressing weakness the last 2 wks  and ambulation requiring a cane.  Exam demonstrated weakness to upper extremities and lower extremity hyperreflexia and wide-based gait.  MRI scan showed stenosis with chronic cord compression with cord myelomalacia changes at C3-4 and C4-5 with diffuse compression with combination of disc osteophyte complex.  Due to her progressive gait problems with falling patient was admitted on an urgent basis for decompression and stabilization due to her cervical cord compression with myelopathic changes.  Procedure: After induction general anesthesia orotracheal intubation had ultra traction application without weight wrist restraints were applied for pulldown due to patient's body habitus with short neck and increased BMI.  Neck was prepped with DuraPrep area squared with towels arms were tucked at the side with yellow pads over the ulnar nerve.  Timeout procedure was completed while the DuraPrep was drying area squared with towels Betadine Steri-Drape after sterile skin marker and thyroid sheet and draped with sterile Mayo stand at the head.  After timeout procedure incision started to midline extending the left based I due to patient's short neck and position at C3-4 and C4-5 needing exposure.  Prominent large spur at C4-5 was noted.  Patient on flexion-extension x-rays actually had some 2 to 3 mm shifting at the C3-4 level on radiographs.  Prominent spur at C4 had to be partially removed in order to fit the 25 short needle  and sterilely draped C-arm was brought in confirming appropriate level it was marked taking chunks of the disc out removing the spurs off the anterior aspect to Leif Loflin the disc.  Client retractors were placed  right and left smooth blade cephalad caudad.  Operative microscope was draped brought and we proceeded back to the posterior longitudinal ligament removing chunks of disc.  Disc base was irregular and about 2 mm until the posterior aspect were widened some and large amount of disc material was present that had been pushed back with overhanging spur coming from C3.  Using the Carling curettes 1 mm Kerrison spurs were carefully moved off the cord.  Some portions of the disc had crystal like deposits in it suggestive of gout or previous cortisone injection.  Patient had no history of gout or pseudogout.  Chunks of disc removed until the dural tube was round decompressed and spurs are above and below were resected.  Trial sizers showed 7 mm graft gave good tight fit.  Cortical Sowles allograft was opened marked anteriorly and with CRNA pulling traction it was countersunk 2 mm.  Graft was stable there was room on the sides for egress of fluid and epidural space had had some Surgiflo placed couple times with patties just prior to placement of the graft and the epidural space was dry.  Identical procedure was repeated at the C3-4 level.  Spurs were removed and after decompression with similar findings with disc osteophytes causing significant compression large amount of disc had been squeezed out of the disc space pressing against the dura and once complete decompression of the dura was performed using 1 to 2 mm Kerrisons microdissection and small curettes trial sizers again shows 7 mm space was of good size.  Once plate was placed to still set off the C3 vertebral body.  1 distal screw was placed plus the spike center checked under fluoroscopy AP and lateral adjusted and then screws were filled.  Screws were fixed in the bottom level followed by middle level at C4 and then finally in C3 which pulled the vertebral body forward where patient had some instability and had had slight retrolisthesis  Was secure final spot  pictures were taken and then the locking screws were used to lock down all sick screws.  Operative field was dry Hemovac was placed in and out technique in line with the skin incision 3 oh platysma 4-0 Vicryl subcuticular closure tincture benzoin Steri-Strips 4 x 4's tape and soft collar with collar extender was applied.  Patient tolerated the procedure well and was transferred to cover room.  She is not awake enough for neurologic exam at  this moment.

## 2022-04-13 NOTE — Anesthesia Preprocedure Evaluation (Addendum)
Anesthesia Evaluation  Patient identified by MRN, date of birth, ID band Patient awake    Reviewed: Allergy & Precautions, NPO status , Patient's Chart, lab work & pertinent test results  History of Anesthesia Complications (+) PONV and history of anesthetic complications  Airway Mallampati: III  TM Distance: >3 FB Neck ROM: Full    Dental  (+) Edentulous Upper, Partial Lower   Pulmonary neg pulmonary ROS,    Pulmonary exam normal        Cardiovascular negative cardio ROS Normal cardiovascular exam     Neuro/Psych Anxiety Depression C3-4, C4-5 stenosis, myelopathy    GI/Hepatic negative GI ROS, Neg liver ROS,   Endo/Other  negative endocrine ROS  Renal/GU negative Renal ROS  negative genitourinary   Musculoskeletal  (+) Arthritis , Fibromyalgia -  Abdominal   Peds  Hematology negative hematology ROS (+)   Anesthesia Other Findings Day of surgery medications reviewed with patient.  Reproductive/Obstetrics negative OB ROS                            Anesthesia Physical Anesthesia Plan  ASA: 2  Anesthesia Plan: General   Post-op Pain Management: Tylenol PO (pre-op)*   Induction: Intravenous  PONV Risk Score and Plan: 4 or greater and Treatment may vary due to age or medical condition, Ondansetron, Dexamethasone and Propofol infusion  Airway Management Planned: Oral ETT and Video Laryngoscope Planned  Additional Equipment: None  Intra-op Plan:   Post-operative Plan: Extubation in OR  Informed Consent: I have reviewed the patients History and Physical, chart, labs and discussed the procedure including the risks, benefits and alternatives for the proposed anesthesia with the patient or authorized representative who has indicated his/her understanding and acceptance.     Dental advisory given  Plan Discussed with: CRNA  Anesthesia Plan Comments:        Anesthesia Quick  Evaluation

## 2022-04-13 NOTE — Interval H&P Note (Signed)
History and Physical Interval Note:  04/13/2022 2:42 PM  Laura Solis  has presented today for surgery, with the diagnosis of C3-4, C4-5 stenosis, myelopathy.  The various methods of treatment have been discussed with the patient and family. After consideration of risks, benefits and other options for treatment, the patient has consented to  Procedure(s): C3-4, C4-5 ANTERIOR CERVICAL DISCECTOMY FUSION, ALLOGRAFT, PLATE (N/A) as a surgical intervention.  The patient's history has been reviewed, patient examined, no change in status, stable for surgery.  I have reviewed the patient's chart and labs.  Questions were answered to the patient's satisfaction.     Eldred Manges

## 2022-04-13 NOTE — Anesthesia Procedure Notes (Addendum)
Procedure Name: Intubation Date/Time: 04/13/2022 3:49 PM  Performed by: Kaylyn Layer, MDPre-anesthesia Checklist: Patient identified, Emergency Drugs available, Suction available and Patient being monitored Patient Re-evaluated:Patient Re-evaluated prior to induction Oxygen Delivery Method: Circle system utilized Preoxygenation: Pre-oxygenation with 100% oxygen Induction Type: IV induction Ventilation: Mask ventilation without difficulty and Oral airway inserted - appropriate to patient size Laryngoscope Size: Glidescope and 3 Grade View: Grade I Tube type: Oral Tube size: 7.0 mm Number of attempts: 1 Airway Equipment and Method: Stylet and Oral airway Placement Confirmation: ETT inserted through vocal cords under direct vision, positive ETCO2 and breath sounds checked- equal and bilateral Secured at: 21 cm Tube secured with: Tape Dental Injury: Teeth and Oropharynx as per pre-operative assessment  Comments: Elective glidescope ACDF

## 2022-04-13 NOTE — Anesthesia Postprocedure Evaluation (Signed)
Anesthesia Post Note  Patient: Laura Solis  Procedure(s) Performed: CERVICAL THREE-FOUR, CERVICAL FOUR- FIVE ANTERIOR CERVICAL DISCECTOMY FUSION, ALLOGRAFT, PLATE (Spine Cervical)     Patient location during evaluation: PACU Anesthesia Type: General Level of consciousness: awake and alert Pain management: pain level controlled Vital Signs Assessment: post-procedure vital signs reviewed and stable Respiratory status: spontaneous breathing, nonlabored ventilation, respiratory function stable and patient connected to nasal cannula oxygen Cardiovascular status: blood pressure returned to baseline and stable Postop Assessment: no apparent nausea or vomiting Anesthetic complications: yes   Encounter Notable Events  Notable Event Outcome Phase Comment  Difficult to intubate - expected  Intraprocedure Filed from anesthesia note documentation.    Last Vitals:  Vitals:   04/13/22 1845 04/13/22 1900  BP: (!) 170/92 (!) 186/86  Pulse: (!) 102 98  Resp: 13 19  Temp: 36.6 C   SpO2: 92% 91%    Last Pain:  Vitals:   04/13/22 1915  TempSrc:   PainSc: Asleep                 Santanna Olenik S

## 2022-04-13 NOTE — Transfer of Care (Signed)
Immediate Anesthesia Transfer of Care Note  Patient: Laura Solis  Procedure(s) Performed: CERVICAL THREE-FOUR, CERVICAL FOUR- FIVE ANTERIOR CERVICAL DISCECTOMY FUSION, ALLOGRAFT, PLATE (Spine Cervical)  Patient Location: PACU  Anesthesia Type:General  Level of Consciousness: awake, alert  and oriented  Airway & Oxygen Therapy: Patient Spontanous Breathing and Patient connected to face mask oxygen  Post-op Assessment: Report given to RN, Post -op Vital signs reviewed and stable and Patient moving all extremities  Post vital signs: Reviewed and stable  Last Vitals:  Vitals Value Taken Time  BP 170/92 04/13/22 1845  Temp    Pulse 103 04/13/22 1848  Resp 17 04/13/22 1848  SpO2 89 % 04/13/22 1848  Vitals shown include unvalidated device data.  Last Pain:  Vitals:   04/13/22 1324  TempSrc:   PainSc: 8          Complications:  Encounter Notable Events  Notable Event Outcome Phase Comment  Difficult to intubate - expected  Intraprocedure Filed from anesthesia note documentation.

## 2022-04-14 ENCOUNTER — Encounter (HOSPITAL_COMMUNITY): Payer: Self-pay | Admitting: Orthopaedic Surgery

## 2022-04-14 DIAGNOSIS — M4802 Spinal stenosis, cervical region: Secondary | ICD-10-CM | POA: Diagnosis not present

## 2022-04-14 NOTE — Progress Notes (Signed)
Patient ID: Laura Solis, female   DOB: 1951/06/25, 71 y.o.   MRN: [id number]   Subjective: 1 Day Post-Op Procedure(s) (LRB): CERVICAL THREE-FOUR, CERVICAL FOUR- FIVE ANTERIOR CERVICAL DISCECTOMY FUSION, ALLOGRAFT, PLATE (N/A) Patient reports pain as mild.    Objective: Vital signs in last 24 hours: Temp:  [97.8 F (36.6 C)-98.6 F (37 C)] 98 F (36.7 C) (09/14 0318) Pulse Rate:  [88-102] 98 (09/14 0318) Resp:  [13-20] 18 (09/14 0318) BP: (130-188)/(74-95) 130/74 (09/14 0318) SpO2:  [91 %-99 %] 91 % (09/14 0318) Weight:  [99.8 kg] 99.8 kg (09/13 1311)  Intake/Output from previous day: 09/13 0701 - 09/14 0700 In: 1720 [P.O.:120; I.V.:1300; IV Piggyback:200] Out: 170 [Drains:20; Blood:150] Intake/Output this shift: No intake/output data recorded.  Recent Labs    04/11/22 1400  HGB 13.4   Recent Labs    04/11/22 1400  WBC 6.1  RBC 4.14  HCT 40.7  PLT 215   No results for input(s): "NA", "K", "CL", "CO2", "BUN", "CREATININE", "GLUCOSE", "CALCIUM" in the last 72 hours. No results for input(s): "LABPT", "INR" in the last 72 hours.  Left leg stronger. Minimal numbness in arms now. Walking better.  DG Cervical Spine 2 or 3 views  Result Date: 04/13/2022 CLINICAL DATA:  ACDF. EXAM: CERVICAL SPINE - 2-3 VIEW COMPARISON:  Cervical spine radiographs 01/11/2021 and MRI 04/07/2022 FLUOROSCOPY: Radiation Exposure Index (as provided by the fluoroscopic device): 1.79 mGy Kerma FINDINGS: 3 intraoperative spot fluoroscopic images are provided. An anterior fusion plate and screws are now in place from C3-C5 with interbody spacer placement as well. IMPRESSION: Intraoperative images during C3-C5 ACDF. Electronically Signed   By: Sebastian Ache M.D.   On: 04/13/2022 19:13   DG C-Arm 1-60 Min-No Report  Result Date: 04/13/2022 Fluoroscopy was utilized by the requesting physician.  No radiographic interpretation.   DG C-Arm 1-60 Min-No Report  Result Date: 04/13/2022 Fluoroscopy was  utilized by the requesting physician.  No radiographic interpretation.    Assessment/Plan: 1 Day Post-Op Procedure(s) (LRB): CERVICAL THREE-FOUR, CERVICAL FOUR- FIVE ANTERIOR CERVICAL DISCECTOMY FUSION, ALLOGRAFT, PLATE (N/A) Up with therapy, discharge home. Has some norco at home . Office one week. Dressing changed and drain removed.  gait improved.   Eldred Manges 04/14/2022, 8:04 AM

## 2022-04-14 NOTE — Evaluation (Signed)
Occupational Therapy Evaluation Patient Details Name: Laura Solis MRN: [id number] DOB: [birth year] Today's Date: 04/14/2022   History of Present Illness 71 yo F adm for scheduled ACDF.  PMH includes: LBP, B TKR, anxiety, ADHD, fibromyalgia.   Clinical Impression   Patient admitted for the scheduled procedure above.  Patient is very close to her baseline, needing Min A for lower body ADL from a sit to stand level.  She is up and walking the halls with a SPC, and had no difficulties with traversing a few stairs.  Precautions reviewed, all questions answered, and no further rehab needs exist in the acute setting.  The patient will having assist as needed from friends for a few days, and should do quite well.  Recommend follow up with MD as prescribed.         Recommendations for follow up therapy are one component of a multi-disciplinary discharge planning process, led by the attending physician.  Recommendations may be updated based on patient status, additional functional criteria and insurance authorization.   Follow Up Recommendations  No OT follow up    Assistance Recommended at Discharge Intermittent Supervision/Assistance  Patient can return home with the following A little help with bathing/dressing/bathroom;Assist for transportation;Assistance with cooking/housework    Functional Status Assessment  Patient has not had a recent decline in their functional status  Equipment Recommendations  None recommended by OT    Recommendations for Other Services       Precautions / Restrictions Precautions Precautions: Cervical Precaution Booklet Issued: Yes (comment) Required Braces or Orthoses: Cervical Brace Cervical Brace: Soft collar;At all times;For comfort Restrictions Weight Bearing Restrictions: No      Mobility Bed Mobility Overal bed mobility: Modified Independent                  Transfers Overall transfer level: Modified independent                  General transfer comment: SPC      Balance Overall balance assessment: Needs assistance Sitting-balance support: Feet supported Sitting balance-Leahy Scale: Good     Standing balance support: Single extremity supported Standing balance-Leahy Scale: Fair                             ADL either performed or assessed with clinical judgement   ADL                       Lower Body Dressing: Minimal assistance;Sit to/from stand                 General ADL Comments: otherwise setup     Vision Patient Visual Report: No change from baseline       Perception Perception Perception: Not tested   Praxis Praxis Praxis: Not tested    Pertinent Vitals/Pain Pain Assessment Pain Assessment: Faces Faces Pain Scale: Hurts a little bit Pain Location: Incision Pain Descriptors / Indicators: Tender Pain Intervention(s): Monitored during session, RN gave pain meds during session     Hand Dominance Left   Extremity/Trunk Assessment Upper Extremity Assessment Upper Extremity Assessment: LUE deficits/detail LUE Deficits / Details: RCT to L shoulder LUE Sensation: WNL LUE Coordination: decreased fine motor   Lower Extremity Assessment Lower Extremity Assessment: Overall WFL for tasks assessed   Cervical / Trunk Assessment Cervical / Trunk Assessment: Neck Surgery   Communication Communication Communication: No difficulties   Cognition Arousal/Alertness: Awake/alert Behavior During  Therapy: WFL for tasks assessed/performed Overall Cognitive Status: Within Functional Limits for tasks assessed                                       General Comments   VSS    Exercises     Shoulder Instructions      Home Living Family/patient expects to be discharged to:: Private residence Living Arrangements: Alone Available Help at Discharge: Friend(s);Available PRN/intermittently Type of Home: House Home Access: Stairs to enter [employer] of Steps: 3 Entrance Stairs-Rails: Left Home Layout: One level     Bathroom Shower/Tub: Producer, television/film/video: Handicapped height Bathroom Accessibility: Yes How Accessible: Accessible via walker Home Equipment: Pharmacist, hospital (2 wheels);Cane - single point          Prior Functioning/Environment Prior Level of Function : Independent/Modified Independent                        OT Problem List: Decreased strength      OT Treatment/Interventions:      OT Goals(Current goals can be found in the care plan section) Acute Rehab OT Goals Patient Stated Goal: Return home today OT Goal Formulation: With patient Time For Goal Achievement: 04/18/22 Potential to Achieve Goals: Good  OT Frequency:      Co-evaluation              AM-PAC OT "6 Clicks" Daily Activity     Outcome Measure Help from another person eating meals?: None Help from another person taking care of personal grooming?: None Help from another person toileting, which includes using toliet, bedpan, or urinal?: None Help from another person bathing (including washing, rinsing, drying)?: A Little Help from another person to put on and taking off regular upper body clothing?: None Help from another person to put on and taking off regular lower body clothing?: A Little 6 Click Score: 22   End of Session Equipment Utilized During Treatment: Other (comment);Cervical collar Nurse Communication: Mobility status  Activity Tolerance: Patient tolerated treatment well Patient left: in chair;with call bell/phone within reach;with family/visitor present  OT Visit Diagnosis: Unsteadiness on feet (R26.81);Muscle weakness (generalized) (M62.81)                Time: 7290-2111 OT Time Calculation (min): 37 min Charges:  OT General Charges $OT Visit: 1 Visit OT Evaluation $OT Eval Moderate Complexity: 1 Mod OT Treatments $Self Care/Home Management : 8-22  mins  04/14/2022  RP, OTR/L  Acute Rehabilitation Services  Office:  [phone]   Suzanna Obey 04/14/2022, 9:42 AM

## 2022-04-14 NOTE — Progress Notes (Signed)
Orthopedic Tech Progress Note Patient Details:  Laura Solis November 26, 1950 [id number]  Ortho Devices Type of Ortho Device: Soft collar Ortho Device/Splint Interventions: Ordered      Bella Kennedy A Baruc Tugwell 04/14/2022, 8:29 AM

## 2022-04-15 ENCOUNTER — Ambulatory Visit: Payer: Medicare Other | Admitting: Specialist

## 2022-04-19 ENCOUNTER — Ambulatory Visit (HOSPITAL_COMMUNITY): Payer: Medicare Other | Attending: Surgery

## 2022-04-21 ENCOUNTER — Encounter: Payer: Self-pay | Admitting: Orthopaedic Surgery

## 2022-04-21 ENCOUNTER — Ambulatory Visit (INDEPENDENT_AMBULATORY_CARE_PROVIDER_SITE_OTHER): Payer: Medicare Other | Admitting: Orthopaedic Surgery

## 2022-04-21 ENCOUNTER — Ambulatory Visit (INDEPENDENT_AMBULATORY_CARE_PROVIDER_SITE_OTHER): Payer: Medicare Other

## 2022-04-21 VITALS — Ht 65.5 in | Wt 220.0 lb

## 2022-04-21 DIAGNOSIS — Z981 Arthrodesis status: Secondary | ICD-10-CM

## 2022-04-21 NOTE — Progress Notes (Signed)
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  She states her left leg is stronger.  Still has balance problems and had cord myelomalacia at 2 levels but has noticed improvement in her gait since the surgery.  Again we discussed that some of her changes may not be reversible.  2 view x-rays look good.  Return 5 weeks for lateral flexion-extension C-spine x-ray.  Chief Complaint:  Chief Complaint  Patient presents with   Neck - Routine Post Op    04/13/2022 C3-4, C4-5 ACDF   Visit Diagnoses:  1. Status post cervical spinal fusion   2. S/P cervical spinal fusion     Plan: Recheck 5 weeks.  Lateral flexion-extension x-rays on return.  Follow-Up Instructions: Return in about 5 weeks (around 05/26/2022).   Orders:  Orders Placed This Encounter  Procedures   XR Cervical Spine 2 or 3 views   No orders of the defined types were placed in this encounter.   Imaging: No results found.  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[place]) 01/16/2018    Class: Chronic   S/P lumbar spinal fusion 01/16/2018   Impingement syndrome of left shoulder 12/14/2017   S/P lumbar fusion 01/26/2017   Postoperative urinary retention 01/11/2017   Lumbar stenosis 01/09/2017   Restless legs 11/14/2016   Osteoarthritis 11/14/2016   Mixed anxiety and depressive disorder 11/14/2016   Attention deficit hyperactivity disorder 11/14/2016   Anxiety 11/14/2016   Adult  attention deficit hyperactivity disorder 11/14/2016   Osteoarthritis of right knee 10/03/2012   Fibromyalgia syndrome 10/03/2012   Obesity (BMI 30.0-34.9) 10/03/2012   Past Medical History:  Diagnosis Date   ADHD (attention deficit hyperactivity disorder)    Anxiety    "from the fibromyalgia"   Chronic lower back pain    Fibromyalgia    Headache    "bad one; monthly" (01/11/2017)   Osteoarthritis    "all over" (01/11/2017)   Pneumonia    "I've had it about 3 times" (01/11/2017)   PONV (postoperative nausea and vomiting)    Rotator cuff tear    left shoulder    No family history on file.  Past Surgical History:  Procedure Laterality Date   ANTERIOR CERVICAL DECOMP/DISCECTOMY FUSION N/A 04/13/2022   Procedure: CERVICAL THREE-FOUR, CERVICAL FOUR- FIVE ANTERIOR CERVICAL DISCECTOMY FUSION, ALLOGRAFT, PLATE;  Surgeon: Marybelle Killings, MD;  Location: [facility];  Service: Orthopedics;  Laterality: N/A;   APPENDECTOMY     BACK SURGERY     CARPAL TUNNEL RELEASE Bilateral    CYST EXCISION     behind rt ear,coxxyx   CYSTECTOMY N/A    from spine.    DILATION AND CURETTAGE OF UTERUS     SHOULDER OPEN ROTATOR CUFF REPAIR Bilateral    TONSILLECTOMY     TOTAL KNEE ARTHROPLASTY Right 10/02/2012   Procedure: TOTAL KNEE ARTHROPLASTY;  Surgeon: Garald Balding, MD;  Location: [facility];  Service: Orthopedics;  Laterality: Right;  Right Total Knee Arthroplasty   TOTAL KNEE ARTHROPLASTY Left 12/25/2019   TOTAL KNEE ARTHROPLASTY Left 12/25/2019   Procedure: LEFT TOTAL KNEE ARTHROPLASTY;  Surgeon: Marybelle Killings, MD;  Location: [facility];  Service: Orthopedics;  Laterality: Left;   TRANSFORAMINAL LUMBAR INTERBODY FUSION (TLIF) WITH PEDICLE SCREW FIXATION 2 LEVEL Left 01/09/2017   L3-4, L4-5/notes 01/09/2017   TUBAL LIGATION     VAGINAL HYSTERECTOMY     Social History   Occupational History   Not on file  Tobacco Use   Smoking status: Never   Smokeless tobacco: Never  Vaping Use   Vaping Use: Never used   Substance and Sexual Activity   Alcohol use: No   Drug use: No   Sexual activity: Not Currently

## 2022-04-26 NOTE — Discharge Summary (Signed)
Patient ID: Laura Solis MRN: [id number] DOB/AGE: [birth year] 71 y.o.  Admit date: 04/13/2022 Discharge date: 04/14/2022  Admission Diagnoses:  Principal Problem:   Cervical stenosis of spine Active Problems:   Stenosis of cervical spine with myelopathy [facility])   Discharge Diagnoses:  Principal Problem:   Cervical stenosis of spine Active Problems:   Stenosis of cervical spine with myelopathy ([facility])  status post Procedure(s): CERVICAL THREE-FOUR, CERVICAL FOUR- FIVE ANTERIOR CERVICAL DISCECTOMY FUSION, ALLOGRAFT, PLATE  Past Medical History:  Diagnosis Date   ADHD (attention deficit hyperactivity disorder)    Anxiety    "from the fibromyalgia"   Chronic lower back pain    Fibromyalgia    Headache    "bad one; monthly" (01/11/2017)   Osteoarthritis    "all over" (01/11/2017)   Pneumonia    "I've had it about 3 times" (01/11/2017)   PONV (postoperative nausea and vomiting)    Rotator cuff tear    left shoulder    Surgeries: Procedure(s): CERVICAL THREE-FOUR, CERVICAL FOUR- FIVE ANTERIOR CERVICAL DISCECTOMY FUSION, ALLOGRAFT, PLATE on 2/35/3614   Consultants:   Discharged Condition: Improved  Hospital Course: Laura Solis is an 71 y.o. female who was admitted 04/13/2022 for operative treatment of Cervical stenosis of spine. Patient failed conservative treatments (please see the history and physical for the specifics) and had severe unremitting pain that affects sleep, daily activities and work/hobbies. After pre-op clearance, the patient was taken to the operating room on 04/13/2022 and underwent  Procedure(s): CERVICAL THREE-FOUR, CERVICAL FOUR- FIVE ANTERIOR CERVICAL DISCECTOMY FUSION, ALLOGRAFT, PLATE.    Patient was given perioperative antibiotics:  Anti-infectives (From admission, onward)    Start     Dose/Rate Route Frequency Ordered Stop   04/14/22 0600  ceFAZolin (ANCEF) IVPB 2g/100 mL premix        2 g 200 mL/hr over 30 Minutes Intravenous On call to O.R.  04/13/22 1306 04/13/22 1610        Patient was given sequential compression devices and early ambulation to prevent DVT.   Patient benefited maximally from hospital stay and there were no complications. At the time of discharge, the patient was urinating/moving their bowels without difficulty, tolerating a regular diet, pain is controlled with oral pain medications and they have been cleared by PT/OT.   Recent vital signs: No data found.   Recent laboratory studies: No results for input(s): "WBC", "HGB", "HCT", "PLT", "NA", "K", "CL", "CO2", "BUN", "CREATININE", "GLUCOSE", "INR", "CALCIUM" in the last 72 hours.  Invalid input(s): "PT", "2"   Discharge Medications:   Allergies as of 04/14/2022       Reactions   Other Other (See Comments)   Anesthesia causes nausea.         Medication List     STOP taking these medications    ibuprofen 600 MG tablet Commonly known as: ADVIL       TAKE these medications    ALPRAZolam 0.5 MG tablet Commonly known as: XANAX Take 0.5 mg by mouth at bedtime.   amphetamine-dextroamphetamine 10 MG tablet Commonly known as: ADDERALL Take 10 mg by mouth 2 (two) times daily.   FLUoxetine 20 MG capsule Commonly known as: PROZAC Take 20 mg by mouth at bedtime.   gabapentin 300 MG capsule Commonly known as: NEURONTIN Take 300 mg by mouth at bedtime.   HYDROcodone-acetaminophen 5-325 MG tablet Commonly known as: NORCO/VICODIN Take 1 tablet by mouth every 6 (six) hours as needed for moderate pain.   pramipexole 1.5 MG tablet Commonly  known as: MIRAPEX Take 1.5 mg by mouth at bedtime.   prednisoLONE acetate 1 % ophthalmic suspension Commonly known as: PRED FORTE Place 1 drop into both eyes at bedtime.   valACYclovir 500 MG tablet Commonly known as: VALTREX Take 500 mg by mouth daily.        Diagnostic Studies: XR Cervical Spine 2 or 3 views  Result Date: 04/21/2022 2 view x-ray C-spine demonstrates fusion allograft plate C3-4  V0-3.  Good position of plate and screws.  Superior screws are close to the C2-3 disc base but do not violate the endplate.  Grafts remain in good position. Impression: Satisfactory C3-C5 to level cervical fusion with allograft and plate.  DG Cervical Spine 2 or 3 views  Result Date: 04/13/2022 CLINICAL DATA:  ACDF. EXAM: CERVICAL SPINE - 2-3 VIEW COMPARISON:  Cervical spine radiographs 01/11/2021 and MRI 04/07/2022 FLUOROSCOPY: Radiation Exposure Index (as provided by the fluoroscopic device): 1.79 mGy Kerma FINDINGS: 3 intraoperative spot fluoroscopic images are provided. An anterior fusion plate and screws are now in place from C3-C5 with interbody spacer placement as well. IMPRESSION: Intraoperative images during C3-C5 ACDF. Electronically Signed   By: Logan Bores M.D.   On: 04/13/2022 19:13   DG C-Arm 1-60 Min-No Report  Result Date: 04/13/2022 Fluoroscopy was utilized by the requesting physician.  No radiographic interpretation.   DG C-Arm 1-60 Min-No Report  Result Date: 04/13/2022 Fluoroscopy was utilized by the requesting physician.  No radiographic interpretation.   MR Cervical Spine w/o contrast  Result Date: 04/07/2022 CLINICAL DATA:  Chronic neck pain with difficulty walking. Cervical spondylosis. Possible myelopathy. EXAM: MRI CERVICAL SPINE WITHOUT CONTRAST TECHNIQUE: Multiplanar, multisequence MR imaging of the cervical spine was performed. No intravenous contrast was administered. COMPARISON:  Limited correlation made with cervical spine radiographs 01/11/2021. FINDINGS: Alignment: Mild cervicothoracic scoliosis. There is a mild degenerative anterolisthesis at C7-T1. Vertebrae: No acute or suspicious osseous findings. Chronic appearing mild superior endplate compression deformity at T2. Multilevel endplate degenerative changes. Cord: There is effacement of the CSF surrounding the cord with mild cord compression at the C3-4 and C4-5 levels. The cord is atrophied at those levels with  linear T2 hyperintensity bilaterally (snake eyes), consistent with myelomalacia. No evidence of cord expansion or hemorrhage. Posterior Fossa, vertebral arteries, paraspinal tissues: Visualized portions of the posterior fossa appear unremarkable.Bilateral vertebral artery flow voids. No significant paraspinal findings. Disc levels: C2-3: Mild uncinate spurring and facet hypertrophy without spinal stenosis or nerve root encroachment. C3-4: Chronic loss of disc height with posterior osteophytes covering diffusely bulging disc material. Effacement of the CSF surrounding the cord with chronic cord compression and myelopathic changes. Severe foraminal narrowing bilaterally. C4-5: Similar chronic loss of disc height with posterior osteophytes covering diffusely bulging disc material. Effacement of the CSF surrounding the cord with chronic cord compression and myelopathic changes. Severe foraminal narrowing bilaterally. C5-6: Chronic loss of disc height with posterior osteophytes covering diffusely bulging disc material. Mild spinal stenosis without cord deformity. Moderate osseous foraminal narrowing bilaterally. C6-7: Mild disc bulging with asymmetric facet hypertrophy on the left. No spinal stenosis or significant foraminal narrowing. C7-T1: Disc bulging with moderate bilateral facet hypertrophy accounting for the slight anterolisthesis. Mild to moderate foraminal narrowing bilaterally without cord deformity. T1-2: Only imaged in the sagittal plane. Disc bulging with a broad-based disc osteophyte complex on the left which causes moderate to severe left foraminal narrowing and possible left T1 nerve root encroachment. Mild indentation of the ventral surface of the cord with ample CSF posteriorly.  T2-3: Only imaged in the sagittal plane. Asymmetric disc bulging and osteophytes on the left contribute to moderate to severe left foraminal narrowing. IMPRESSION: 1. Multilevel cervical spondylosis similar to prior radiographs.  No acute osseous findings identified. 2. At both C3-4 and C4-5, there is chronic cord compression with cord atrophy and myelomalacia. Severe foraminal narrowing is present bilaterally which may affect the exiting nerve roots. 3. Lesser spondylosis at the additional levels as detailed above, without significant central spinal stenosis. Mild-to-moderate osseous foraminal narrowing is present bilaterally at C5-6 and C7-T1. In addition, there is left-sided foraminal narrowing at T1-2 and T2-3, only imaged in the sagittal plane. Electronically Signed   By: Carey Bullocks M.D.   On: 04/07/2022 14:12   XR KNEE 3 VIEW LEFT  Result Date: 04/07/2022 Standing AP both knees lateral left knee sunrise patella x-ray knee x-rays were obtained and compared to 2021 images.  Well-positioned left total knee arthroplasty good alignment no subsidence or loosening.  No soft tissue calcification. Impression: Satisfactory left total knee arthroplasty radiographs.  XR Cervical Spine 2 or 3 views  Result Date: 04/07/2022 2 view x-rays show severe spondylosis with some retrolisthesis at C3-4 2 mm disc space.  Significant anterior posterior spurring at C4-5 C5-6 and milder involvement at C6-7.  Left greater than right facet and uncovertebral changes seen throughout the mid cervical spine on AP radiograph. Impression: Multilevel cervical spondylosis from C3-C7.  Mild retrolisthesis C3-4.   Discharge Instructions     Incentive spirometry RT   Complete by: As directed         Follow-up Information     Eldred Manges, MD. Schedule an appointment as soon as possible for a visit today.   Specialty: Orthopedic Surgery Why: need return office visit with dr yates one week postop Contact information: [address] [place] [address] [phone]                 Discharge Plan:  discharge to home  Disposition:     Signed: Zonia Kief  04/26/2022, 10:10 AM

## 2022-04-27 ENCOUNTER — Ambulatory Visit: Payer: Medicare Other | Admitting: Specialist

## 2022-04-28 ENCOUNTER — Telehealth: Payer: Self-pay | Admitting: Radiology

## 2022-04-28 NOTE — Telephone Encounter (Signed)
Patient called [place] office stating that her [employer] company had requested an extension on the paperwork that I had just completed and sent in. She states that even though it says work resumption to be discussed at follow up appt with date, they have told her she will run out of benefit if she does not return on that date and they would have to start over. Form pulled and note is added that patient will not return to work at time of follow up appointment. Anticipated out of work x 8 weeks, which we can extend if needed at follow up appt. We can also send back to work earlier if needed.  Updated form is faxed.

## 2022-05-24 ENCOUNTER — Telehealth: Payer: Self-pay | Admitting: Radiology

## 2022-05-24 NOTE — Telephone Encounter (Signed)
Patient needs documentation stating that she will not return to work on 05/26/2022 at time of next office visit. Per last phone message and disability paperwork, we have that we anticipate patient will be out of work x 8 weeks post op which should cover her through next office visit. Work resumption will be discussed then. This note needs to be faxed to the [employer] at [phone] attn: Mayra Neer.  Note entered and faxed.

## 2022-05-26 ENCOUNTER — Ambulatory Visit (INDEPENDENT_AMBULATORY_CARE_PROVIDER_SITE_OTHER): Payer: Medicare Other | Admitting: Orthopaedic Surgery

## 2022-05-26 ENCOUNTER — Encounter: Payer: Self-pay | Admitting: Orthopaedic Surgery

## 2022-05-26 ENCOUNTER — Ambulatory Visit (INDEPENDENT_AMBULATORY_CARE_PROVIDER_SITE_OTHER): Payer: Medicare Other

## 2022-05-26 VITALS — Ht 65.5 in | Wt 220.0 lb

## 2022-05-26 DIAGNOSIS — Z981 Arthrodesis status: Secondary | ICD-10-CM

## 2022-05-26 NOTE — Progress Notes (Signed)
Post-Op Visit Note   Patient: Laura Solis           Date of Birth: 1950/10/27           MRN: [id number] Visit Date: 05/26/2022 PCP: [name], DO   Assessment & Plan: Postop two-level cervical fusion C3-4 C4-5 with cord myelopathy and myelopathic gait.  She still walking with a cane she would not start doing a lot of walking trying to strengthen her legs now her left knee which had previous total knee arthroplasty is bothering her more.  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  Flexion-extension x-rays show no motion and better incorporation of the upper than lower graft.  No screw loosening.  Discontinue collar.  She will back off on her walking some to allow her quad soreness to dissipate.  I will recheck her again in 4 weeks.  Work slip given no work x6 weeks.  Chief Complaint:  Chief Complaint  Patient presents with   Neck - Follow-up, Routine Post Op    04/13/2022 C3-4, C4-5 ACDF   Visit Diagnoses:  1. Status post cervical spinal fusion     Plan: ROV 3 wks  Follow-Up Instructions: Return in about 4 weeks (around 06/23/2022).   Orders:  Orders Placed This Encounter  Procedures   XR Cervical Spine 2 or 3 views   No orders of the defined types were placed in this encounter.   Imaging: XR Cervical Spine 2 or 3 views  Result Date: 05/26/2022 AP and lateral flexion-extension cervical spine images show two-level cervical fusion C3-4 and C4-5.  No motion noted and progressive incorporation of the graft more with upper graft and lower at this point. Impression: Satisfactory two-level cervical fusion.  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[place]) 01/16/2018    Class: Chronic   S/P lumbar spinal fusion 01/16/2018   Impingement syndrome of left shoulder 12/14/2017   S/P lumbar fusion 01/26/2017   Postoperative urinary retention 01/11/2017   Lumbar stenosis 01/09/2017   Restless legs 11/14/2016   Osteoarthritis 11/14/2016   Mixed anxiety and depressive disorder 11/14/2016   Attention deficit hyperactivity disorder 11/14/2016   Anxiety 11/14/2016   Adult attention deficit hyperactivity disorder 11/14/2016   Osteoarthritis of right knee 10/03/2012   Fibromyalgia syndrome 10/03/2012   Obesity (BMI 30.0-34.9) 10/03/2012   Past Medical History:  Diagnosis Date   ADHD (attention deficit hyperactivity disorder)    Anxiety    "from the fibromyalgia"   Chronic lower back pain    Fibromyalgia    Headache    "bad one; monthly" (01/11/2017)   Osteoarthritis    "all over" (01/11/2017)   Pneumonia    "I've had it about 3 times" (01/11/2017)   PONV (postoperative nausea and vomiting)    Rotator cuff tear    left shoulder    No family history on file.  Past Surgical History:  Procedure Laterality Date   ANTERIOR CERVICAL DECOMP/DISCECTOMY FUSION N/A 04/13/2022   Procedure: CERVICAL THREE-FOUR, CERVICAL FOUR- FIVE ANTERIOR CERVICAL DISCECTOMY FUSION, ALLOGRAFT, PLATE;  Surgeon:  Marybelle Killings, MD;  Location: [facility];  Service: Orthopedics;  Laterality: N/A;   APPENDECTOMY     BACK SURGERY     CARPAL TUNNEL RELEASE Bilateral    CYST EXCISION     behind rt ear,coxxyx   CYSTECTOMY N/A    from spine.    DILATION AND CURETTAGE OF UTERUS     SHOULDER OPEN ROTATOR CUFF REPAIR Bilateral    TONSILLECTOMY     TOTAL KNEE ARTHROPLASTY Right 10/02/2012   Procedure: TOTAL KNEE ARTHROPLASTY;  Surgeon: Garald Balding, MD;  Location: [facility];  Service: Orthopedics;  Laterality: Right;  Right Total Knee Arthroplasty   TOTAL KNEE ARTHROPLASTY Left 12/25/2019   TOTAL KNEE  ARTHROPLASTY Left 12/25/2019   Procedure: LEFT TOTAL KNEE ARTHROPLASTY;  Surgeon: Marybelle Killings, MD;  Location: [facility];  Service: Orthopedics;  Laterality: Left;   TRANSFORAMINAL LUMBAR INTERBODY FUSION (TLIF) WITH PEDICLE SCREW FIXATION 2 LEVEL Left 01/09/2017   L3-4, L4-5/notes 01/09/2017   TUBAL LIGATION     VAGINAL HYSTERECTOMY     Social History   Occupational History   Not on file  Tobacco Use   Smoking status: Never   Smokeless tobacco: Never  Vaping Use   Vaping Use: Never used  Substance and Sexual Activity   Alcohol use: No   Drug use: No   Sexual activity: Not Currently

## 2022-06-03 ENCOUNTER — Ambulatory Visit (INDEPENDENT_AMBULATORY_CARE_PROVIDER_SITE_OTHER): Payer: Medicare Other | Admitting: Surgery

## 2022-06-03 ENCOUNTER — Encounter: Payer: Self-pay | Admitting: Surgery

## 2022-06-03 DIAGNOSIS — M5416 Radiculopathy, lumbar region: Secondary | ICD-10-CM

## 2022-06-03 DIAGNOSIS — Z981 Arthrodesis status: Secondary | ICD-10-CM

## 2022-06-03 MED ORDER — HYDROCODONE-ACETAMINOPHEN 5-325 MG PO TABS: 1.0000 | ORAL_TABLET | Freq: Four times a day (QID) | ORAL | 0 refills | Status: DC | PRN

## 2022-06-06 ENCOUNTER — Ambulatory Visit
Admission: RE | Admit: 2022-06-06 | Discharge: 2022-06-06 | Disposition: A | Discharge status: Home / Self Care | Payer: Medicare Other | Source: Ambulatory Visit | Attending: Surgery | Admitting: Surgery

## 2022-06-06 DIAGNOSIS — Z981 Arthrodesis status: Secondary | ICD-10-CM

## 2022-06-06 DIAGNOSIS — M5416 Radiculopathy, lumbar region: Secondary | ICD-10-CM

## 2022-06-10 ENCOUNTER — Ambulatory Visit (INDEPENDENT_AMBULATORY_CARE_PROVIDER_SITE_OTHER): Payer: Medicare Other | Admitting: Sports Medicine

## 2022-06-10 ENCOUNTER — Encounter: Payer: Self-pay | Admitting: Sports Medicine

## 2022-06-10 ENCOUNTER — Ambulatory Visit: Payer: Self-pay

## 2022-06-10 DIAGNOSIS — G8929 Other chronic pain: Secondary | ICD-10-CM

## 2022-06-10 DIAGNOSIS — M533 Sacrococcygeal disorders, not elsewhere classified: Secondary | ICD-10-CM | POA: Diagnosis not present

## 2022-06-10 NOTE — Progress Notes (Signed)
   Procedure Note  Patient: Laura Solis             Date of Birth: 1950/12/11           MRN: [id number]             Visit Date: 06/10/2022  Procedures: Visit Diagnoses:  1. Chronic SI joint pain   2. Chronic right SI joint pain    U/S-guided SI-joint injection, right   After discussion of risk/benefits/indications, informed verbal consent was obtained. A timeout was then performed. The patient was positioned in a prone position on exam room table with a pillow placed under the pelvis for mild hip flexion. The SI joint area was cleaned and prepped with betadine and alcohol swabs. Sterile ultrasound gel was applied and the ultrasound transducer was placed in an anatomic axial plane over the PSIS, then moved distally over the SI-joint. Using ultrasound guidance, a 22-gauge, 3.5" needle was inserted from a medial to lateral approach utilizing an in-plane approach and directed into the SI-joint. The SI-joint was then injected with a mixture of 4:2 lidocaine:depomedrol with visualization of the injectate flow into the SI-joint under ultrasound visualization. The patient tolerated the procedure well without immediate complications.  - I evaluated the patient about 10 minutes post-injection and she had great improvement in pain and range of motion - follow-up with Zonia Kief, PA-C as indicated; I am happy to see them as needed  Madelyn Brunner, DO Primary Care Sports Medicine Physician  [facility] - Orthopedics  This note was dictated using Dragon naturally speaking software and may contain errors in syntax, spelling, or content which have not been identified prior to signing this note.

## 2022-06-16 ENCOUNTER — Encounter: Payer: Self-pay | Admitting: Orthopaedic Surgery

## 2022-06-16 ENCOUNTER — Ambulatory Visit (INDEPENDENT_AMBULATORY_CARE_PROVIDER_SITE_OTHER): Payer: Medicare Other | Admitting: Orthopaedic Surgery

## 2022-06-16 VITALS — Ht 65.5 in | Wt 220.0 lb

## 2022-06-16 DIAGNOSIS — M48062 Spinal stenosis, lumbar region with neurogenic claudication: Secondary | ICD-10-CM

## 2022-06-16 DIAGNOSIS — Z981 Arthrodesis status: Secondary | ICD-10-CM

## 2022-06-16 NOTE — Progress Notes (Signed)
Office Visit Note   Patient: Laura Solis           Date of Birth: [birth year]           MRN: [id number] Visit Date: 06/03/2022              Requested by: Joaquin Courts, DO [address] [place],  [place] [address] PCP: Joaquin Courts, DO   Assessment & Plan: Visit Diagnoses:  1. S/P lumbar fusion   2. Radiculopathy, lumbar region     Plan: Again I will reorder lumbar MRI and I will have her follow-up with Dr. Ophelia Charter to review the study.  I will also like her to follow-up with Dr. Shon Baton in 1 week for diagnostic/therapeutic ultrasound-guided right SI joint injection.  If she has good relief with that injection then he can proceed with doing the left.  Dr. Ophelia Charter will see how she is doing after that is done as well.  Follow-Up Instructions: Return in about 1 week (around 06/10/2022) for WITH DR BROOKS FOR DIAGNOSTIC/THERAPEUTIC US GUIDED RIGHT SI JOINT INJECTION.   Orders:  Orders Placed This Encounter  Procedures   MR Lumbar Spine w/o contrast   Meds ordered this encounter  Medications   HYDROcodone-acetaminophen (NORCO/VICODIN) 5-325 MG tablet    Sig: Take 1 tablet by mouth every 6 (six) hours as needed for moderate pain.    Dispense:  40 tablet    Refill:  0      Procedures: No procedures performed   Clinical Data: No additional findings.   Subjective: Chief Complaint  Patient presents with   Lower Back - Pain    HPI 71 year old white female returns with complaints of low back pain and bilateral lower extremity radicular symptoms.  Patient last seen by me March 31, 2022 and at that time I ordered lumbar MRI due to same symptoms.  She did not have scan done.  She was also supposed to follow-up with Dr. Otelia Sergeant to review the study.  Canceled appointments with him April 15, 2022 in April 27, 2022.  No showed for lumbar MRI April 19, 2022.  She was seen in the ED earlier today for increased back pain and lower extremity radicular symptoms.   Referred to our office. Review of Systems No current complaints of cardiopulmonary GI/GU issues  Objective: Vital Signs: There were no vitals taken for this visit.  Physical Exam HENT:     Head: Normocephalic and atraumatic.     Nose: Nose normal.  Eyes:     Extraocular Movements: Extraocular movements intact.  Musculoskeletal:     Comments: Gait is normal.  Lumbar paraspinal tenderness.  Positive bilateral sciatic notch tenderness.  Negative logroll.  Negative straight leg raise.  No focal motor deficits.  Marked tenderness over the right greater than left SI joint.  Neurological:     Mental Status: She is alert and oriented to person, place, and time.  Psychiatric:        Mood and Affect: Mood normal.     Ortho Exam  Specialty Comments:  No specialty comments available.  Imaging: No results found.   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) 01/16/2018    Class: Chronic   S/P lumbar spinal fusion 01/16/2018   Impingement syndrome of left shoulder 12/14/2017   S/P lumbar fusion 01/26/2017   Postoperative urinary retention 01/11/2017   Lumbar stenosis 01/09/2017   Restless legs 11/14/2016   Osteoarthritis 11/14/2016   Mixed anxiety and depressive disorder 11/14/2016   Attention deficit hyperactivity disorder 11/14/2016   Anxiety 11/14/2016   Adult attention deficit hyperactivity disorder 11/14/2016   Osteoarthritis of right knee 10/03/2012   Fibromyalgia syndrome 10/03/2012   Obesity (BMI 30.0-34.9) 10/03/2012   Past Medical History:  Diagnosis Date   ADHD (attention deficit hyperactivity disorder)    Anxiety     "from the fibromyalgia"   Chronic lower back pain    Fibromyalgia    Headache    "bad one; monthly" (01/11/2017)   Osteoarthritis    "all over" (01/11/2017)   Pneumonia    "I've had it about 3 times" (01/11/2017)   PONV (postoperative nausea and vomiting)    Rotator cuff tear    left shoulder    No family history on file.  Past Surgical History:  Procedure Laterality Date   ANTERIOR CERVICAL DECOMP/DISCECTOMY FUSION N/A 04/13/2022   Procedure: CERVICAL THREE-FOUR, CERVICAL FOUR- FIVE ANTERIOR CERVICAL DISCECTOMY FUSION, ALLOGRAFT, PLATE;  Surgeon: Eldred Manges, MD;  Location: [facility];  Service: Orthopedics;  Laterality: N/A;   APPENDECTOMY     BACK SURGERY     CARPAL TUNNEL RELEASE Bilateral    CYST EXCISION     behind rt ear,coxxyx   CYSTECTOMY N/A    from spine.    DILATION AND CURETTAGE OF UTERUS     SHOULDER OPEN ROTATOR CUFF REPAIR Bilateral    TONSILLECTOMY     TOTAL KNEE ARTHROPLASTY Right 10/02/2012   Procedure: TOTAL KNEE ARTHROPLASTY;  Surgeon: Valeria Batman, MD;  Location: [facility] OR;  Service: Orthopedics;  Laterality: Right;  Right Total Knee Arthroplasty   TOTAL KNEE ARTHROPLASTY Left 12/25/2019   TOTAL KNEE ARTHROPLASTY Left 12/25/2019   Procedure: LEFT TOTAL KNEE ARTHROPLASTY;  Surgeon: Eldred Manges, MD;  Location: [facility];  Service: Orthopedics;  Laterality: Left;   TRANSFORAMINAL LUMBAR INTERBODY FUSION (TLIF) WITH PEDICLE SCREW FIXATION 2 LEVEL Left 01/09/2017   L3-4, L4-5/notes 01/09/2017   TUBAL LIGATION     VAGINAL HYSTERECTOMY     Social History   Occupational History   Not on file  Tobacco Use   Smoking status: Never   Smokeless tobacco: Never  Vaping Use   Vaping Use: Never used  Substance and Sexual Activity   Alcohol use: No   Drug use: No   Sexual activity: Not Currently

## 2022-06-16 NOTE — Progress Notes (Signed)
Office Visit Note   Patient: Laura Solis           Date of Birth: 23-Jun-1951           MRN: [id number] Visit Date: 06/16/2022              Requested by: Joaquin Courts, DO [address] [place],  [place] [address] PCP: Joaquin Courts, DO   Assessment & Plan: Visit Diagnoses:  1. S/P lumbar spinal fusion   2. Spinal stenosis of lumbar region with neurogenic claudication     Plan: Patient had adjacent level stenosis at L2-3 just above previous instrumented fusion L3-S1.  She has had problems with cage backout in the past.  She has some room for a side connector if decided to use some side bar connectors between L1 4 and L5.  She does not appear to have room between L3 and L4.  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  We will have her see Dr. Christell Constant so he can evaluate her and discuss scheduling surgery.  Follow-Up Instructions: No follow-ups on file.   Orders:  No orders of the defined types were placed in this encounter.  No orders of the defined types were placed in this encounter.     Procedures: No procedures performed   Clinical Data: No additional findings.   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  Zonia Kief recently ordered an SI joint injection she is seeing Dr. Shon Baton for injection under ultrasound head he had discussed injecting the left SI joint.  She is done a new MRI scan which shows previous fusion by Dr. Otelia Sergeant  solid and good decompression L3-S1 but she has had progressive severe stenosis now at L2-3 with adjacent level severe stenosis.  Patient had revision surgery initially for cage backout.  Patient states she is having gait great difficulty standing cannot walk  distances use her cane or walker.  She is used ibuprofen and also hydrocodone and not gotten relief.  Other problems include ADHD restless legs previous total knee arthroplasty doing well cervical fusion doing well two-level from 04/13/2022.  Review of Systems all other systems noncontributory HPI.   Objective: Vital Signs: Ht 5' 5.5" (1.664 m)   Wt 220 lb (99.8 kg)   BMI 36.05 kg/m   Physical Exam Constitutional:      Appearance: She is well-developed.  HENT:     Head: Normocephalic.     Right Ear: External ear normal.     Left Ear: External ear normal. There is no impacted cerumen.  Eyes:     Pupils: Pupils are equal, round, and reactive to light.  Neck:     Thyroid: No thyromegaly.     Trachea: No tracheal deviation.  Cardiovascular:     Rate and Rhythm: Normal rate.  Pulmonary:     Effort: Pulmonary effort is normal.  Abdominal:     Palpations: Abdomen is soft.  Musculoskeletal:     Cervical back: No rigidity.  Skin:    General: Skin is warm and dry.  Neurological:     Mental Status: She is alert and oriented to person, place, and time.  Psychiatric:  Behavior: Behavior normal.     Ortho Exam patient is amatory with a cane.  Negative logroll's right and left hips knees reach full extension knee arthroplasty incision.  She has some tenderness over the sacroiliac joint sciatic notch and over the trochanters.  Specialty Comments:  No specialty comments available.  Imaging: Narrative & Impression  CLINICAL DATA:  Low back pain, symptoms for cyst with greater than 6 weeks of treatment. Lumbar radiculopathy. Progressive low back pain and bilateral lower extremity radiculopathy.   EXAM: MRI LUMBAR SPINE WITHOUT CONTRAST   TECHNIQUE: Multiplanar, multisequence MR imaging of the lumbar spine was performed. No intravenous contrast was administered.   COMPARISON:  Lumbar spine radiographs 01/26/2022. Lumbar myelogram 11/16/2017   FINDINGS: Segmentation: 5  non rib-bearing lumbar type vertebral bodies are present. The lowest fully formed vertebral body is L5.   Alignment: Slight degenerative retrolisthesis is present at L1-2 and L2-3.   Vertebrae: Diffuse fatty infiltration of the marrow is present at L4 and L5. Edematous endplate marrow changes are present anteriorly at L2-3. Chronic fatty endplate marrow changes are present at T12-L1 and to lesser extent at L1-2. Endplate Schmorl's nodes are present. Vertebral body heights are otherwise maintained.   Conus medullaris and cauda equina: Conus extends to the L1-2 level. Conus and cauda equina appear normal.   Paraspinal and other soft tissues: Limited imaging the abdomen is unremarkable. There is no significant adenopathy. No solid organ lesions are present.   A fluid collection posterior to the dura at L4 measures 22 x 21 x 14 mm. There is no mass effect on the spinal canal.   Disc levels:   T12-L1: A broad-based disc protrusion is asymmetric to the left. Moderate facet hypertrophy present. Progressive subarticular narrowing is present bilaterally. Moderate right foraminal stenosis is present.   L1-2: A broad-based disc protrusion present. Moderate facet hypertrophy and ligamentum flavum thickening is noted. This results in progressive central canal stenosis with crowding of nerve roots. Moderate foraminal stenosis is now present bilaterally.   L2-3: Progressive adjacent level disease is present. Broad-based disc protrusion is present. Advanced facet hypertrophy is noted. This results in severe central canal stenosis with crowding of the nerve roots. Moderate foraminal narrowing is scratched at moderate foraminal stenosis is present, left greater than right.   L3-4: Wide laminectomy and fusion is present. No residual recurrent stenosis is present.   L4-5: Wide laminectomy and fusion is present. No residual recurrent stenosis is present.   L5-S1: Solid fusion is present. No  residual or recurrent stenosis is present.   IMPRESSION: 1. Wide laminectomy and fusion at L3-4, L4-5, and L5-S1 without residual or recurrent stenosis at these levels. 2. 22 x 21 x 14 mm fluid collection posterior to the dura at L4 without mass effect on the spinal canal. This likely represents a chronic postoperative seroma. 3. Progressive adjacent level disease at L2-3 with severe central canal stenosis and crowding of the nerve roots. 4. Moderate foraminal narrowing bilaterally at L2-3 is worse on the left. 5. Progressive moderate central canal stenosis at L1-2 with crowding of the nerve roots. 6. Moderate foraminal stenosis bilaterally at L1-2. 7. Progressive subarticular narrowing bilaterally at T12-L1 with moderate right foraminal stenosis.     Electronically Signed   By: Marin Roberts M.D.   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) 01/16/2018    Class: Chronic   S/P lumbar spinal fusion 01/16/2018   Impingement syndrome of left shoulder 12/14/2017   S/P lumbar fusion 01/26/2017   Postoperative urinary retention 01/11/2017   Spinal stenosis of lumbar region 01/09/2017   Restless legs 11/14/2016   Osteoarthritis 11/14/2016   Mixed anxiety and depressive disorder 11/14/2016   Attention deficit hyperactivity disorder 11/14/2016   Anxiety 11/14/2016   Adult attention deficit hyperactivity disorder 11/14/2016   Osteoarthritis of right knee 10/03/2012   Fibromyalgia syndrome 10/03/2012   Obesity (BMI 30.0-34.9) 10/03/2012   Past Medical History:  Diagnosis Date   ADHD (attention  deficit hyperactivity disorder)    Anxiety    "from the fibromyalgia"   Chronic lower back pain    Fibromyalgia    Headache    "bad one; monthly" (01/11/2017)   Osteoarthritis    "all over" (01/11/2017)   Pneumonia    "I've had it about 3 times" (01/11/2017)   PONV (postoperative nausea and vomiting)    Rotator cuff tear    left shoulder    No family history on file.  Past Surgical History:  Procedure Laterality Date   ANTERIOR CERVICAL DECOMP/DISCECTOMY FUSION N/A 04/13/2022   Procedure: CERVICAL THREE-FOUR, CERVICAL FOUR- FIVE ANTERIOR CERVICAL DISCECTOMY FUSION, ALLOGRAFT, PLATE;  Surgeon: Eldred Manges, MD;  Location: [facility];  Service: Orthopedics;  Laterality: N/A;   APPENDECTOMY     BACK SURGERY     CARPAL TUNNEL RELEASE Bilateral    CYST EXCISION     behind rt ear,coxxyx   CYSTECTOMY N/A    from spine.    DILATION AND CURETTAGE OF UTERUS     SHOULDER OPEN ROTATOR CUFF REPAIR Bilateral    TONSILLECTOMY     TOTAL KNEE ARTHROPLASTY Right 10/02/2012   Procedure: TOTAL KNEE ARTHROPLASTY;  Surgeon: Valeria Batman, MD;  Location: [facility] OR;  Service: Orthopedics;  Laterality: Right;  Right Total Knee Arthroplasty   TOTAL KNEE ARTHROPLASTY Left 12/25/2019   TOTAL KNEE ARTHROPLASTY Left 12/25/2019   Procedure: LEFT TOTAL KNEE ARTHROPLASTY;  Surgeon: Eldred Manges, MD;  Location: [facility];  Service: Orthopedics;  Laterality: Left;   TRANSFORAMINAL LUMBAR INTERBODY FUSION (TLIF) WITH PEDICLE SCREW FIXATION 2 LEVEL Left 01/09/2017   L3-4, L4-5/notes 01/09/2017   TUBAL LIGATION     VAGINAL HYSTERECTOMY     Social History   Occupational History   Not on file  Tobacco Use   Smoking status: Never   Smokeless tobacco: Never  Vaping Use   Vaping Use: Never used  Substance and Sexual Activity   Alcohol use: No   Drug use: No   Sexual activity: Not Currently

## 2022-06-30 ENCOUNTER — Ambulatory Visit (INDEPENDENT_AMBULATORY_CARE_PROVIDER_SITE_OTHER): Payer: Medicare Other | Admitting: Orthopedic Surgery

## 2022-06-30 ENCOUNTER — Ambulatory Visit (INDEPENDENT_AMBULATORY_CARE_PROVIDER_SITE_OTHER): Payer: Medicare Other

## 2022-06-30 ENCOUNTER — Encounter: Payer: Self-pay | Admitting: Orthopedic Surgery

## 2022-06-30 VITALS — BP 145/78 | HR 77 | Ht 65.5 in | Wt 220.0 lb

## 2022-06-30 DIAGNOSIS — Z981 Arthrodesis status: Secondary | ICD-10-CM | POA: Diagnosis not present

## 2022-06-30 NOTE — Progress Notes (Addendum)
Orthopedic Spine Surgery Office Note  Assessment: Patient is a 71 y.o. female with lumbar stenosis and neurogenic claudication that is failed to improve with conservative treatments   Plan: -Explained that initially conservative treatment is tried to see what relief can be gained with these non-surgical treatment modalities. Discussed that the conservative treatments include:  -activity modification  -physical therapy  -over the counter pain medications  -medrol dosepak -Patient has tried physical therapy, gabapentin, over-the-counter medications, activity modification, walker use -She has not gotten any relief with the treatments she has tried.  She feels pain on a daily basis and is affecting her quality of life.  She states that she can no longer walk because of the pain.  Accordingly, discussed operative management in the form of extension of her fusion and decompression.  We specifically discussed a T10 to pelvis fusion with laminectomies at T12-L3. -Patient will next be seen at the date of surgery  The patient has symptoms consistent with lumbar stenosis. The patient's symptoms were not getting improvement with conservative treatment so operative management was discussed in the form of T10-pelvis PSIF and T12-L3 laminectomies with foraminotomies. The risks including but not limited to dural tear, nerve root injury, paralysis, persistent pain, pseudarthrosis, infection, bleeding, hardware failure, adjacent segment disease, heart attack, death, stroke, fracture, blindness, and need for additional procedures were discussed with the patient.  Explained that her risk of dural tear, wound dehiscence, and infection would be higher in her case because it is a revision surgery. The benefit of the surgery would be improvement in the patient's symptoms related to the lumbar stenosis which would be the leg pain. I explained that back pain relief is not the goal of the surgery and it is not reliably  alleviated with this surgery. The alternatives to surgical management were covered with the patient and included continued monitoring, physical therapy, over-the-counter pain medications, ambulatory aids, and activity modification. All the patient's questions were answered to their satisfaction. After this discussion, the patient expressed understanding and elected to proceed with surgical intervention.     ___________________________________________________________________________   History:  Patient is a 71 y.o. female who presents today for low back pain that radiates into her bilateral thighs.  Her thigh pain is much more significant than her back pain.  States that it hurts every time that she walks.  She does get relief if she sits down or leans over a shopping cart.  She has had this pain for the last year.  She had seen my partner Dr. Ophelia Charter and was having symptoms of myelopathy so the proceed with two-level ACDF before addressing the lumbar spine.  She also has a history of a lumbar decompression and fusion.  Pain does not completely go away when sitting but is significantly better.    Weakness: Feels her legs are weaker generally.  No other weakness noted Symptoms of imbalance: Yes, but has improved since ACDF Paresthesias and numbness: Yes, into her thighs.  No other numbness or tingling. Bowel or bladder incontinence: Yes, but has improved since ACDF Saddle anesthesia: Denies  Symptoms:  -leg cramping: Yes, feels her thighs are cramping  -leg heaviness: Denies  -with improvement upon flexion of back: Yes, has positive shopping cart sign Can walk 1 minute before symptoms start  Treatments tried: physical therapy, gabapentin, over-the-counter medications, activity modification, walker use   Review of systems: Denies fevers and chills, night sweats, unexplained weight loss, history of cancer, pain that wakes them at night  Past medical history:  Fibromyalgia Anxiety Irritable  bowel syndrome Chronic pain Osteoarthritis  Allergies: Has had significant nausea with anesthesia, no other allergies  Past surgical history:  C3-5 ACDF Appendectomy Lumbar spine surgery x 3, currently has a L3-S1 PSIF Cystectomy Carpal tunnel release (bilateral) Rotator cuff repair bilaterally Tonsillectomy Bilateral TKA Tubal ligation Hysterectomy  Social history: Denies use of nicotine product (smoking, vaping, patches, smokeless) Alcohol use: Denies Denies recreational drug use   Physical Exam:  BMI 34.9  General: no acute distress, appears stated age Neurologic: alert, answering questions appropriately, following commands Respiratory: unlabored breathing on room air, symmetric chest rise Psychiatric: appropriate affect, normal cadence to speech Vascular: palpable dorsalis pedis pulses bilaterally, feet warm and well perfused   MSK (spine):  -Strength exam      Left  Right EHL    4/5  4/5 TA    5/5  5/5 GSC    5/5  5/5 Knee extension  5/5  5/5 Hip flexion   4+/5  4+/5  -Sensory exam    Sensation intact to light touch in L3-S1 nerve distributions of bilateral lower extremities (decreased sensation in toes bilaterally, but no decreased sensation proximally)  Left hip exam: no pain through range of motion, negative stinchfield Right hip exam: no pain through range of motion, negative stinchfield  -Straight leg raise: negative bilaterally -Clonus: no beats bilaterally  Imaging: XR of the lumbar spine from 06/30/2022 was independently reviewed and interpreted, showing instrumentation from L3-S1.  There is lucency around the L3 screws.  No other lucency seen.  No broken fixation.  There are interbody devices at L3-4 and L4-5.  Lordotic alignment.  There is spondylolisthesis at L4-5.  MRI of the lumbar spine from 06/06/2022 was independently reviewed and interpreted, showing central lateral recess stenosis at T12-L1, L1-L2, L2-L3.  Stenosis is most significant  at L2/3.  There is foraminal stenosis on the left side at L1-2 and L2-3.  There is a disc herniation at T10-11 with ventral thecal sac effacement.   Patient name: Laura Solis Patient MRN: [id number] Date of visit: 06/30/22

## 2022-07-07 ENCOUNTER — Telehealth: Payer: Self-pay | Admitting: Orthopedic Surgery

## 2022-07-07 NOTE — Telephone Encounter (Signed)
Patient states she wants to get some information about her surgery. Please Advise..[phone]

## 2022-07-08 NOTE — Telephone Encounter (Signed)
I spoke with patient and advised we are waiting on surgery clearance from her PCP before we can schedule surgery. As the schedule stands now, patient's surgery will more than likely be scheduled after the new year. All questions were answered. Patient understands we will be calling her to schedule once clearance comes in from PCP.

## 2022-08-02 NOTE — Progress Notes (Signed)
Surgical Instructions    Your procedure is scheduled on Tuesday January 9th.  Report to [facility] Main Entrance "A" at 5:30 A.M., then check in with the Admitting office.  Call this number if you have problems the morning of surgery:  [phone]   If you have any questions prior to your surgery date call [phone]: Open Monday-Friday 8am-4pm If you experience any cold or flu symptoms such as cough, fever, chills, shortness of breath, etc. between now and your scheduled surgery, please notify us at the above number     Remember:  Do not eat after midnight the night before your surgery  You may drink clear liquids until 04:30  the morning of your surgery.   Clear liquids allowed are: Water, Non-Citrus Juices (without pulp), Carbonated Beverages, Clear Tea, Black Coffee ONLY (NO MILK, CREAM OR POWDERED CREAMER of any kind), and Gatorade  Please drink your Pre Surgical Ensure by 4:30 AM    Take these medicines the morning of surgery with A SIP OF WATER:   valACYclovir (VALTREX) 500 MG   IF NEEDED  HYDROcodone-acetaminophen (NORCO)  prednisoLONE acetate (PRED FORTE) 1 % eye drops  As of today, STOP taking any Aspirin (unless otherwise instructed by your surgeon) Aleve, Naproxen, Ibuprofen, Motrin, Advil, Goody's, BC's, all herbal medications, fish oil, and all vitamins.           Do not wear jewelry or makeup. Do not wear lotions, powders, perfumes or deodorant. Do not shave 48 hours prior to surgery.   Do not bring valuables to the hospital. Do not wear nail polish, gel polish, artificial nails, or any other type of covering on natural nails (fingers and toes) If you have artificial nails or gel coating that need to be removed by a nail salon, please have this removed prior to surgery. Artificial nails or gel coating may interfere with anesthesia's ability to adequately monitor your vital signs.  Center is not responsible for any belongings or valuables.    Do NOT Smoke  (Tobacco/Vaping)  24 hours prior to your procedure  If you use a CPAP at night, you may bring your mask for your overnight stay.   Contacts, glasses, hearing aids, dentures or partials may not be worn into surgery, please bring cases for these belongings   For patients admitted to the hospital, discharge time will be determined by your treatment team.   Patients discharged the day of surgery will not be allowed to drive home, and someone needs to stay with them for 24 hours.   SURGICAL WAITING ROOM VISITATION Patients having surgery or a procedure may have no more than 2 support people in the waiting area - these visitors may rotate.   Children under the age of 63 must have an adult with them who is not the patient. If the patient needs to stay at the hospital during part of their recovery, the visitor guidelines for inpatient rooms apply. Pre-op nurse will coordinate an appropriate time for 1 support person to accompany patient in pre-op.  This support person may not rotate.   Please refer to [email or web address] for the visitor guidelines for Inpatients (after your surgery is over and you are in a regular room).    Special instructions:    Oral Hygiene is also important to reduce your risk of infection.  Remember - BRUSH YOUR TEETH THE MORNING OF SURGERY WITH YOUR REGULAR TOOTHPASTE   [facility]- Preparing For Surgery  Before surgery, you can play an important role.  Because skin is not sterile, your skin needs to be as free of germs as possible. You can reduce the number of germs on your skin by washing with CHG (chlorahexidine gluconate) Soap before surgery.  CHG is an antiseptic cleaner which kills germs and bonds with the skin to continue killing germs even after washing.     Please do not use if you have an allergy to CHG or antibacterial soaps. If your skin becomes reddened/irritated stop using the CHG.  Do not shave (including  legs and underarms) for at least 48 hours prior to first CHG shower. It is OK to shave your face.  Please follow these instructions carefully.     Shower the NIGHT BEFORE SURGERY and the MORNING OF SURGERY with CHG Soap.   If you chose to wash your hair, wash your hair first as usual with your normal shampoo. After you shampoo, rinse your hair and body thoroughly to remove the shampoo.  Then [employer] and genitals (private parts) with your normal soap and rinse thoroughly to remove soap.  After that Use CHG Soap as you would any other liquid soap. You can apply CHG directly to the skin and wash gently with a scrungie or a clean washcloth.   Apply the CHG Soap to your body ONLY FROM THE NECK DOWN.  Do not use on open wounds or open sores. Avoid contact with your eyes, ears, mouth and genitals (private parts). Wash Face and genitals (private parts)  with your normal soap.   Wash thoroughly, paying special attention to the area where your surgery will be performed.  Thoroughly rinse your body with warm water from the neck down.  DO NOT shower/wash with your normal soap after using and rinsing off the CHG Soap.  Pat yourself dry with a CLEAN TOWEL.  Wear CLEAN PAJAMAS to bed the night before surgery  Place CLEAN SHEETS on your bed the night before your surgery  DO NOT SLEEP WITH PETS.   Day of Surgery:  Take a shower with CHG soap. Wear Clean/Comfortable clothing the morning of surgery Do not apply any deodorants/lotions.   Remember to brush your teeth WITH YOUR REGULAR TOOTHPASTE.    If you received a COVID test during your pre-op visit, it is requested that you wear a mask when out in public, stay away from anyone that may not be feeling well, and notify your surgeon if you develop symptoms. If you have been in contact with anyone that has tested positive in the last 10 days, please notify your surgeon.    Please read over the following fact sheets that you were given.

## 2022-08-03 ENCOUNTER — Other Ambulatory Visit: Payer: Self-pay

## 2022-08-03 ENCOUNTER — Encounter (HOSPITAL_COMMUNITY): Payer: Self-pay

## 2022-08-03 ENCOUNTER — Encounter (HOSPITAL_COMMUNITY)
Admission: RE | Admit: 2022-08-03 | Discharge: 2022-08-03 | Disposition: A | Discharge status: Home / Self Care | Payer: Medicare Other | Source: Ambulatory Visit | Attending: Orthopedic Surgery | Admitting: Orthopedic Surgery

## 2022-08-03 ENCOUNTER — Telehealth: Payer: Self-pay | Admitting: Orthopedic Surgery

## 2022-08-03 VITALS — BP 139/83 | HR 76 | Temp 98.0°F | Resp 18 | Ht 65.5 in | Wt 213.7 lb

## 2022-08-03 DIAGNOSIS — M48062 Spinal stenosis, lumbar region with neurogenic claudication: Secondary | ICD-10-CM | POA: Diagnosis not present

## 2022-08-03 DIAGNOSIS — Z01818 Encounter for other preprocedural examination: Secondary | ICD-10-CM | POA: Diagnosis not present

## 2022-08-03 LAB — BASIC METABOLIC PANEL
Anion gap: 10 (ref 5–15)
BUN: 13 mg/dL (ref 8–23)
CO2: 24 mmol/L (ref 22–32)
Calcium: 9.2 mg/dL (ref 8.9–10.3)
Chloride: 104 mmol/L (ref 98–111)
Creatinine, Ser: 0.72 mg/dL (ref 0.44–1.00)
GFR, Estimated: >60 mL/min (ref >60)
Glucose, Bld: 101 mg/dL — ABNORMAL HIGH (ref 70–99)
Potassium: 3.6 mmol/L (ref 3.5–5.1)
Sodium: 138 mmol/L (ref 135–145)

## 2022-08-03 LAB — SURGICAL PCR SCREEN
MRSA, PCR: NEGATIVE
Staphylococcus aureus: POSITIVE — AB

## 2022-08-03 LAB — CBC
HCT: 41.8 % (ref 36.0–46.0)
Hemoglobin: 13.6 g/dL (ref 12.0–15.0)
MCH: 31.6 pg (ref 26.0–34.0)
MCHC: 32.5 g/dL (ref 30.0–36.0)
MCV: 97 fL (ref 80.0–100.0)
Platelets: 230 10*3/uL (ref 150–400)
RBC: 4.31 MIL/uL (ref 3.87–5.11)
RDW: 13.3 % (ref 11.5–15.5)
WBC: 7 10*3/uL (ref 4.0–10.5)
nRBC: 0 % (ref 0.0–0.2)

## 2022-08-03 LAB — TYPE AND SCREEN
ABO/RH(D): A POS
Antibody Screen: NEGATIVE

## 2022-08-03 NOTE — Telephone Encounter (Signed)
Orthopedic Telephone Call  Spoke with patient this evening. She was able to get her pre-op testing done today. I talked to her about her alignment. She has PI-LL mismatch. PI of 64 and LL of 42. I told her that I would like to add L2/3 XLIF to her procedure to try and restore some of her lordosis to match her pelvis angle. I covered the risks and benefits of XLIF as an addition to the prior procedure we discussed. All her questions were answered to her satisfaction. I also discussed her bone health.She stated that she had a bone scan a few years ago (outside institution) and was diagnosed with osteopenia. She has been taking Vit D and calcium. Never has been diagnosed with osteoporosis and follows regularly with her primary care. She explained that her symptoms have gotten worse since the last time I saw her. Her pain is more significant especially in the legs. She states it is now 80% leg pain and 20% back pain. We discussed her symptoms in depth. All her questions about the surgery were answered. We will proceed with surgery as planned. T10-pelvis PSIF, T12-L3 laminectomies, L2/3 XLIF.  The patient has PI-LL mismatch so I told her I was going to use an XLIF to try to increase her lordosis. The risks including but not limited to dural tear, nerve root injury, paralysis, pseudarthrosis, persistent pain, infection, bleeding, hardware failure, adjacent segment disease, vascular injury, psoas weakness, lumbar plexus injury, heart attack, death, stroke, fracture, and need for additional procedures were discussed with the patient. The benefit of the surgery would be improvement in the patient's alignment which may help with some of her back pain. I explained that back pain relief is not the main goal of the surgery but since I am planning to do a fusion surgery, I want to fuse her in an alignment that more closely matches her pelvis anatomy. The alternatives to surgical management were covered with the patient and  included continued monitoring, physical therapy, over-the-counter pain medications, ambulatory aids, and activity modification. After this discussion, the patient expressed understanding and elected to proceed with this as an additional component of her surgery.

## 2022-08-03 NOTE — Progress Notes (Signed)
PCP - Leitha Schuller Cardiologist - denies  PPM/ICD - denies Device Orders -  Rep Notified -   Chest x-ray -  EKG - requested from PCP office; will need DOS if not received. Stress Test - denies ECHO -denies  Cardiac Cath - denies  Sleep Study - yes- "borderline sleep apnea" CPAP - no   Fasting Blood Sugar - na Checks Blood Sugar _____ times a day  Last dose of GLP1 agonist-  na GLP1 instructions:   Blood Thinner Instructions:na Aspirin Instructions:na  ERAS Protcol - clear liquids until 0430 PRE-SURGERY Ensure or G2-Ensure  COVID TEST- na   Anesthesia review: no  Patient denies shortness of breath, fever, cough and chest pain at PAT appointment   All instructions explained to the patient, with a verbal understanding of the material. Patient agrees to go over the instructions while at home for a better understanding. Patient also instructed to wear a mask when out in public prior to surgery. The opportunity to ask questions was provided.

## 2022-08-09 ENCOUNTER — Inpatient Hospital Stay (HOSPITAL_COMMUNITY)
Admission: RE | Admit: 2022-08-09 | Discharge: 2022-08-13 | DRG: 454 | Disposition: A | Discharge status: Home Health | Payer: Medicare Other | Attending: Orthopedic Surgery | Admitting: Orthopedic Surgery

## 2022-08-09 ENCOUNTER — Other Ambulatory Visit: Payer: Self-pay

## 2022-08-09 ENCOUNTER — Encounter (HOSPITAL_COMMUNITY): Payer: Self-pay | Admitting: Orthopedic Surgery

## 2022-08-09 ENCOUNTER — Inpatient Hospital Stay (HOSPITAL_COMMUNITY): Payer: Medicare Other

## 2022-08-09 ENCOUNTER — Inpatient Hospital Stay (HOSPITAL_COMMUNITY): Payer: Medicare Other | Admitting: Anesthesiology

## 2022-08-09 ENCOUNTER — Encounter (HOSPITAL_COMMUNITY)
Admission: RE | Disposition: A | Discharge status: Home Health | Payer: Self-pay | Source: Home / Self Care | Attending: Orthopedic Surgery

## 2022-08-09 DIAGNOSIS — M1990 Unspecified osteoarthritis, unspecified site: Secondary | ICD-10-CM | POA: Diagnosis present

## 2022-08-09 DIAGNOSIS — Y831 Surgical operation with implant of artificial internal device as the cause of abnormal reaction of the patient, or of later complication, without mention of misadventure at the time of the procedure: Secondary | ICD-10-CM | POA: Diagnosis present

## 2022-08-09 DIAGNOSIS — F419 Anxiety disorder, unspecified: Secondary | ICD-10-CM | POA: Diagnosis present

## 2022-08-09 DIAGNOSIS — M438X9 Other specified deforming dorsopathies, site unspecified: Secondary | ICD-10-CM

## 2022-08-09 DIAGNOSIS — I10 Essential (primary) hypertension: Secondary | ICD-10-CM | POA: Diagnosis present

## 2022-08-09 DIAGNOSIS — M5126 Other intervertebral disc displacement, lumbar region: Secondary | ICD-10-CM | POA: Diagnosis present

## 2022-08-09 DIAGNOSIS — M48062 Spinal stenosis, lumbar region with neurogenic claudication: Secondary | ICD-10-CM

## 2022-08-09 DIAGNOSIS — M797 Fibromyalgia: Secondary | ICD-10-CM | POA: Diagnosis present

## 2022-08-09 DIAGNOSIS — Z884 Allergy status to anesthetic agent status: Secondary | ICD-10-CM

## 2022-08-09 DIAGNOSIS — K589 Irritable bowel syndrome without diarrhea: Secondary | ICD-10-CM | POA: Diagnosis present

## 2022-08-09 DIAGNOSIS — D62 Acute posthemorrhagic anemia: Secondary | ICD-10-CM | POA: Diagnosis not present

## 2022-08-09 DIAGNOSIS — Z90710 Acquired absence of both cervix and uterus: Secondary | ICD-10-CM | POA: Diagnosis not present

## 2022-08-09 DIAGNOSIS — Z96653 Presence of artificial knee joint, bilateral: Secondary | ICD-10-CM | POA: Diagnosis present

## 2022-08-09 DIAGNOSIS — T84226A Displacement of internal fixation device of vertebrae, initial encounter: Secondary | ICD-10-CM | POA: Diagnosis present

## 2022-08-09 DIAGNOSIS — M8580 Other specified disorders of bone density and structure, unspecified site: Secondary | ICD-10-CM | POA: Diagnosis present

## 2022-08-09 DIAGNOSIS — M4804 Spinal stenosis, thoracic region: Secondary | ICD-10-CM | POA: Diagnosis present

## 2022-08-09 DIAGNOSIS — M4056 Lordosis, unspecified, lumbar region: Secondary | ICD-10-CM | POA: Diagnosis present

## 2022-08-09 DIAGNOSIS — Z9889 Other specified postprocedural states: Secondary | ICD-10-CM

## 2022-08-09 DIAGNOSIS — G8929 Other chronic pain: Secondary | ICD-10-CM | POA: Diagnosis present

## 2022-08-09 DIAGNOSIS — Z79899 Other long term (current) drug therapy: Secondary | ICD-10-CM | POA: Diagnosis not present

## 2022-08-09 DIAGNOSIS — N179 Acute kidney failure, unspecified: Secondary | ICD-10-CM | POA: Diagnosis not present

## 2022-08-09 DIAGNOSIS — M438X6 Other specified deforming dorsopathies, lumbar region: Secondary | ICD-10-CM

## 2022-08-09 DIAGNOSIS — Z981 Arthrodesis status: Secondary | ICD-10-CM

## 2022-08-09 HISTORY — PX: ANTERIOR LAT LUMBAR FUSION: SHX1168

## 2022-08-09 HISTORY — PX: SPINAL FUSION: SHX223

## 2022-08-09 LAB — POCT I-STAT 7, (LYTES, BLD GAS, ICA,H+H)
Acid-base deficit: 3 mmol/L — ABNORMAL HIGH (ref 0.0–2.0)
Acid-base deficit: 3 mmol/L — ABNORMAL HIGH (ref 0.0–2.0)
Bicarbonate: 23.1 mmol/L (ref 20.0–28.0)
Bicarbonate: 22.8 mmol/L (ref 20.0–28.0)
Calcium, Ion: 1.12 mmol/L — ABNORMAL LOW (ref 1.15–1.40)
Calcium, Ion: 1.1 mmol/L — ABNORMAL LOW (ref 1.15–1.40)
HCT: 35 % — ABNORMAL LOW (ref 36.0–46.0)
HCT: 33 % — ABNORMAL LOW (ref 36.0–46.0)
Hemoglobin: 11.9 g/dL — ABNORMAL LOW (ref 12.0–15.0)
Hemoglobin: 11.2 g/dL — ABNORMAL LOW (ref 12.0–15.0)
O2 Saturation: 100 %
O2 Saturation: 100 %
Patient temperature: 37.4
Potassium: 3.5 mmol/L (ref 3.5–5.1)
Potassium: 4 mmol/L (ref 3.5–5.1)
Sodium: 138 mmol/L (ref 135–145)
Sodium: 138 mmol/L (ref 135–145)
TCO2: 24 mmol/L (ref 22–32)
TCO2: 24 mmol/L (ref 22–32)
pCO2 arterial: 43 mmHg (ref 32–48)
pCO2 arterial: 42.5 mmHg (ref 32–48)
pH, Arterial: 7.34 — ABNORMAL LOW (ref 7.35–7.45)
pH, Arterial: 7.337 — ABNORMAL LOW (ref 7.35–7.45)
pO2, Arterial: 220 mmHg — ABNORMAL HIGH (ref 83–108)
pO2, Arterial: 215 mmHg — ABNORMAL HIGH (ref 83–108)

## 2022-08-09 LAB — GLUCOSE, CAPILLARY: Glucose-Capillary: 182 mg/dL — ABNORMAL HIGH (ref 70–99)

## 2022-08-09 SURGERY — FUSION, SPINE, 2 OR MORE LEVELS, POSTERIOR APPROACH
Anesthesia: General | Site: Spine Thoracic

## 2022-08-09 MED ORDER — LIDOCAINE 2% (20 MG/ML) 5 ML SYRINGE
INTRAMUSCULAR | Status: AC
  Filled 2022-08-09: qty 5

## 2022-08-09 MED ORDER — EPINEPHRINE PF 1 MG/ML IJ SOLN
INTRAMUSCULAR | Status: AC
  Filled 2022-08-09: qty 1

## 2022-08-09 MED ORDER — ROCURONIUM BROMIDE 10 MG/ML (PF) SYRINGE
PREFILLED_SYRINGE | INTRAVENOUS | Status: AC
  Filled 2022-08-09: qty 10

## 2022-08-09 MED ORDER — METHYLPREDNISOLONE ACETATE 40 MG/ML IJ SUSP
INTRAMUSCULAR | Status: AC
  Filled 2022-08-09: qty 1

## 2022-08-09 MED ORDER — CEFAZOLIN SODIUM 1 G IJ SOLR
INTRAMUSCULAR | Status: AC
  Filled 2022-08-09: qty 20

## 2022-08-09 MED ORDER — LIDOCAINE 2% (20 MG/ML) 5 ML SYRINGE
INTRAMUSCULAR | Status: DC | PRN
  Administered 2022-08-09: 60 mg via INTRAVENOUS

## 2022-08-09 MED ORDER — AMISULPRIDE (ANTIEMETIC) 5 MG/2ML IV SOLN: 10.0000 mg | Freq: Once | INTRAVENOUS | Status: DC | PRN

## 2022-08-09 MED ORDER — VANCOMYCIN HCL 1000 MG IV SOLR
INTRAVENOUS | Status: AC
  Filled 2022-08-09: qty 20

## 2022-08-09 MED ORDER — FENTANYL CITRATE (PF) 250 MCG/5ML IJ SOLN
INTRAMUSCULAR | Status: DC | PRN
  Administered 2022-08-09 (×3): 50 ug via INTRAVENOUS

## 2022-08-09 MED ORDER — SODIUM CHLORIDE (PF) 0.9 % IJ SOLN
INTRAMUSCULAR | Status: AC
  Filled 2022-08-09: qty 20

## 2022-08-09 MED ORDER — VANCOMYCIN HCL 1000 MG IV SOLR
INTRAVENOUS | Status: DC | PRN
  Administered 2022-08-09: 1000 mg via TOPICAL

## 2022-08-09 MED ORDER — LABETALOL HCL 5 MG/ML IV SOLN
10.0000 mg | INTRAVENOUS | Status: DC | PRN
  Administered 2022-08-09: 10 mg via INTRAVENOUS

## 2022-08-09 MED ORDER — HYDROMORPHONE HCL 1 MG/ML IJ SOLN
INTRAMUSCULAR | Status: AC
  Filled 2022-08-09: qty 1

## 2022-08-09 MED ORDER — HYDROMORPHONE HCL 1 MG/ML IJ SOLN
0.2500 mg | INTRAMUSCULAR | Status: DC | PRN
  Administered 2022-08-09 (×3): 0.5 mg via INTRAVENOUS

## 2022-08-09 MED ORDER — ACETAMINOPHEN 10 MG/ML IV SOLN
INTRAVENOUS | Status: DC | PRN
  Administered 2022-08-09 (×2): 1000 mg via INTRAVENOUS

## 2022-08-09 MED ORDER — CEFAZOLIN SODIUM-DEXTROSE 2-4 GM/100ML-% IV SOLN
2.0000 g | INTRAVENOUS | Status: AC
  Administered 2022-08-09 (×4): 2 g via INTRAVENOUS
  Filled 2022-08-09: qty 100

## 2022-08-09 MED ORDER — ACETAMINOPHEN 10 MG/ML IV SOLN
INTRAVENOUS | Status: AC
  Filled 2022-08-09: qty 100

## 2022-08-09 MED ORDER — PROPOFOL 10 MG/ML IV BOLUS
INTRAVENOUS | Status: AC
  Filled 2022-08-09: qty 20

## 2022-08-09 MED ORDER — PHENYLEPHRINE 80 MCG/ML (10ML) SYRINGE FOR IV PUSH (FOR BLOOD PRESSURE SUPPORT)
PREFILLED_SYRINGE | INTRAVENOUS | Status: DC | PRN
  Administered 2022-08-09: 80 ug via INTRAVENOUS
  Administered 2022-08-09: 160 ug via INTRAVENOUS

## 2022-08-09 MED ORDER — PROPOFOL 1000 MG/100ML IV EMUL
INTRAVENOUS | Status: AC
  Filled 2022-08-09: qty 100

## 2022-08-09 MED ORDER — PHENYLEPHRINE 80 MCG/ML (10ML) SYRINGE FOR IV PUSH (FOR BLOOD PRESSURE SUPPORT)
PREFILLED_SYRINGE | INTRAVENOUS | Status: AC
  Filled 2022-08-09: qty 10

## 2022-08-09 MED ORDER — KETAMINE HCL 10 MG/ML IJ SOLN
INTRAMUSCULAR | Status: DC | PRN
  Administered 2022-08-09 (×2): 5 mg via INTRAVENOUS
  Administered 2022-08-09: 30 mg via INTRAVENOUS
  Administered 2022-08-09 (×6): 5 mg via INTRAVENOUS
  Administered 2022-08-09 (×2): 10 mg via INTRAVENOUS
  Administered 2022-08-09 (×2): 5 mg via INTRAVENOUS

## 2022-08-09 MED ORDER — PROPOFOL 500 MG/50ML IV EMUL
INTRAVENOUS | Status: DC | PRN
  Administered 2022-08-09: 125 ug/kg/min via INTRAVENOUS

## 2022-08-09 MED ORDER — ALBUMIN HUMAN 5 % IV SOLN: INTRAVENOUS | Status: DC | PRN

## 2022-08-09 MED ORDER — THROMBIN 20000 UNITS EX SOLR: CUTANEOUS | Status: DC | PRN

## 2022-08-09 MED ORDER — FENTANYL CITRATE (PF) 250 MCG/5ML IJ SOLN
INTRAMUSCULAR | Status: AC
  Filled 2022-08-09: qty 5

## 2022-08-09 MED ORDER — ONDANSETRON HCL 4 MG/2ML IJ SOLN
INTRAMUSCULAR | Status: AC
  Filled 2022-08-09: qty 2

## 2022-08-09 MED ORDER — ACETAMINOPHEN 500 MG PO TABS
1000.0000 mg | ORAL_TABLET | Freq: Once | ORAL | Status: AC
  Administered 2022-08-09: 1000 mg via ORAL
  Filled 2022-08-09: qty 2

## 2022-08-09 MED ORDER — ONDANSETRON HCL 4 MG/2ML IJ SOLN
INTRAMUSCULAR | Status: DC | PRN
  Administered 2022-08-09: 4 mg via INTRAVENOUS

## 2022-08-09 MED ORDER — KETAMINE HCL 50 MG/5ML IJ SOSY
PREFILLED_SYRINGE | INTRAMUSCULAR | Status: AC
  Filled 2022-08-09: qty 10

## 2022-08-09 MED ORDER — POVIDONE-IODINE 10 % EX SWAB
2.0000 | Freq: Once | CUTANEOUS | Status: AC
  Administered 2022-08-09: 2 via TOPICAL

## 2022-08-09 MED ORDER — MIDAZOLAM HCL 2 MG/2ML IJ SOLN
INTRAMUSCULAR | Status: AC
  Filled 2022-08-09: qty 2

## 2022-08-09 MED ORDER — BUPIVACAINE HCL (PF) 0.25 % IJ SOLN
INTRAMUSCULAR | Status: AC
  Filled 2022-08-09: qty 30

## 2022-08-09 MED ORDER — EPHEDRINE 5 MG/ML INJ
INTRAVENOUS | Status: AC
  Filled 2022-08-09: qty 5

## 2022-08-09 MED ORDER — PHENYLEPHRINE HCL-NACL 20-0.9 MG/250ML-% IV SOLN
INTRAVENOUS | Status: DC | PRN
  Administered 2022-08-09: 25 ug/min via INTRAVENOUS

## 2022-08-09 MED ORDER — LACTATED RINGERS IV SOLN: INTRAVENOUS | Status: DC

## 2022-08-09 MED ORDER — CHLORHEXIDINE GLUCONATE 0.12 % MT SOLN
15.0000 mL | Freq: Once | OROMUCOSAL | Status: AC
  Administered 2022-08-09: 15 mL via OROMUCOSAL
  Filled 2022-08-09: qty 15

## 2022-08-09 MED ORDER — SUCCINYLCHOLINE CHLORIDE 200 MG/10ML IV SOSY
PREFILLED_SYRINGE | INTRAVENOUS | Status: DC | PRN
  Administered 2022-08-09: 120 mg via INTRAVENOUS

## 2022-08-09 MED ORDER — BUPIVACAINE LIPOSOME 1.3 % IJ SUSP
INTRAMUSCULAR | Status: AC
  Filled 2022-08-09: qty 20

## 2022-08-09 MED ORDER — DEXAMETHASONE SODIUM PHOSPHATE 10 MG/ML IJ SOLN
10.0000 mg | Freq: Once | INTRAMUSCULAR | Status: AC
  Administered 2022-08-09: 10 mg via INTRAVENOUS
  Filled 2022-08-09: qty 1

## 2022-08-09 MED ORDER — SUFENTANIL CITRATE 50 MCG/ML IV SOLN
0.2500 ug/kg/h | INTRAVENOUS | Status: DC
  Administered 2022-08-09 (×2): .15 ug/kg/h via INTRAVENOUS
  Administered 2022-08-09: .25 ug/kg/h via INTRAVENOUS
  Filled 2022-08-09 (×7): qty 1

## 2022-08-09 MED ORDER — EPHEDRINE SULFATE-NACL 50-0.9 MG/10ML-% IV SOSY
PREFILLED_SYRINGE | INTRAVENOUS | Status: DC | PRN
  Administered 2022-08-09: 10 mg via INTRAVENOUS

## 2022-08-09 MED ORDER — PROPOFOL 1000 MG/100ML IV EMUL
INTRAVENOUS | Status: AC
  Filled 2022-08-09: qty 500

## 2022-08-09 MED ORDER — THROMBIN 20000 UNITS EX KIT
PACK | CUTANEOUS | Status: AC
  Filled 2022-08-09: qty 1

## 2022-08-09 MED ORDER — TRANEXAMIC ACID-NACL 1000-0.7 MG/100ML-% IV SOLN
1000.0000 mg | INTRAVENOUS | Status: AC
  Administered 2022-08-09: 1000 mg via INTRAVENOUS
  Filled 2022-08-09: qty 100

## 2022-08-09 MED ORDER — PROPOFOL 10 MG/ML IV BOLUS
INTRAVENOUS | Status: DC | PRN
  Administered 2022-08-09: 150 mg via INTRAVENOUS

## 2022-08-09 MED ORDER — LACTATED RINGERS IV SOLN: INTRAVENOUS | Status: DC | PRN

## 2022-08-09 MED ORDER — MIDAZOLAM HCL 2 MG/2ML IJ SOLN
INTRAMUSCULAR | Status: DC | PRN
  Administered 2022-08-09: 1 mg via INTRAVENOUS

## 2022-08-09 MED ORDER — ORAL CARE MOUTH RINSE: 15.0000 mL | Freq: Once | OROMUCOSAL | Status: AC

## 2022-08-09 MED ORDER — LABETALOL HCL 5 MG/ML IV SOLN
INTRAVENOUS | Status: AC
  Filled 2022-08-09: qty 4

## 2022-08-09 MED ORDER — 0.9 % SODIUM CHLORIDE (POUR BTL) OPTIME
TOPICAL | Status: DC | PRN
  Administered 2022-08-09: 1000 mL

## 2022-08-09 MED ORDER — DEXAMETHASONE SODIUM PHOSPHATE 10 MG/ML IJ SOLN
INTRAMUSCULAR | Status: AC
  Filled 2022-08-09: qty 1

## 2022-08-09 SURGICAL SUPPLY — 121 items
APPLIER CLIP 11 MED OPEN (CLIP)
BAG COUNTER SPONGE SURGICOUNT (BAG) ×2 IMPLANT
BENZOIN TINCTURE PRP APPL 2/3 (GAUZE/BANDAGES/DRESSINGS) IMPLANT
BLADE CLIPPER SURG (BLADE) IMPLANT
BLADE SURG 10 STRL SS (BLADE) ×2 IMPLANT
BONE CANC CHIPS 20CC PCAN1/4 (Bone Implant) ×2 IMPLANT
BONE FIBERS PLIAFX 10 (Bone Implant) ×4 IMPLANT
BUR NEURO DRILL SOFT 3.0X3.8M (BURR) ×2 IMPLANT
CAGE MODULUS XL 8X18X50 - 10 (Cage) IMPLANT
CANISTER SUCT 3000ML PPV (MISCELLANEOUS) ×2 IMPLANT
CHIPS CANC BONE 20CC PCAN1/4 (Bone Implant) ×2 IMPLANT
CLIP APPLIE 11 MED OPEN (CLIP) ×2 IMPLANT
CORD BIPOLAR FORCEPS 12FT (ELECTRODE) ×2 IMPLANT
COVER MAYO STAND STRL (DRAPES) ×2 IMPLANT
COVER SURGICAL LIGHT HANDLE (MISCELLANEOUS) ×2 IMPLANT
DERMABOND ADVANCED .7 DNX12 (GAUZE/BANDAGES/DRESSINGS) IMPLANT
DIGITIZER BENDINI (MISCELLANEOUS) IMPLANT
DISSECTOR BLUNT TIP ENDO 5MM (MISCELLANEOUS) IMPLANT
DRAPE C-ARM 42X72 X-RAY (DRAPES) ×4 IMPLANT
DRAPE MICROSCOPE LEICA 54X105 (DRAPES) ×2 IMPLANT
DRAPE POUCH INSTRU U-SHP 10X18 (DRAPES) ×2 IMPLANT
DRAPE SURG 17X23 STRL (DRAPES) ×6 IMPLANT
DRAPE UTILITY XL STRL (DRAPES) ×2 IMPLANT
DRESSING MEPILEX FLEX 4X4 (GAUZE/BANDAGES/DRESSINGS) ×2 IMPLANT
DRSG MEPILEX BORDER 4X12 (GAUZE/BANDAGES/DRESSINGS) ×2 IMPLANT
DRSG MEPILEX FLEX 4X4 (GAUZE/BANDAGES/DRESSINGS)
DRSG MEPILEX POST OP 4X12 (GAUZE/BANDAGES/DRESSINGS) IMPLANT
DRSG TEGADERM 4X10 (GAUZE/BANDAGES/DRESSINGS) ×2 IMPLANT
DRSG TEGADERM 4X4.75 (GAUZE/BANDAGES/DRESSINGS) IMPLANT
DURAPREP 26ML APPLICATOR (WOUND CARE) ×2 IMPLANT
ELECT BLADE 4.0 EZ CLEAN MEGAD (MISCELLANEOUS) ×2
ELECT CAUTERY BLADE 6.4 (BLADE) ×2 IMPLANT
ELECT COATED BLADE 2.86 ST (ELECTRODE) IMPLANT
ELECT NVM5 SURFACE MEP/EMG (ELECTRODE) IMPLANT
ELECT PENCIL ROCKER SW 15FT (MISCELLANEOUS) ×2 IMPLANT
ELECT REM PT RETURN 9FT ADLT (ELECTROSURGICAL) ×2
ELECTRODE BLDE 4.0 EZ CLN MEGD (MISCELLANEOUS) ×2 IMPLANT
ELECTRODE REM PT RTRN 9FT ADLT (ELECTROSURGICAL) ×2 IMPLANT
EVACUATOR 1/8 PVC DRAIN (DRAIN) IMPLANT
GAUZE 4X4 16PLY ~~LOC~~+RFID DBL (SPONGE) IMPLANT
GAUZE SPONGE 4X4 12PLY STRL (GAUZE/BANDAGES/DRESSINGS) IMPLANT
GLOVE BIO SURGEON STRL SZ7 (GLOVE) ×2 IMPLANT
GLOVE BIO SURGEON STRL SZ7.5 (GLOVE) ×2 IMPLANT
GLOVE BIO SURGEON STRL SZ8 (GLOVE) ×2 IMPLANT
GLOVE BIOGEL PI IND STRL 7.0 (GLOVE) ×2 IMPLANT
GLOVE BIOGEL PI IND STRL 8 (GLOVE) ×4 IMPLANT
GLOVE INDICATOR 7.5 STRL GRN (GLOVE) ×2 IMPLANT
GLOVE SURG ENC MOIS LTX SZ6.5 (GLOVE) ×2 IMPLANT
GOWN STRL REUS W/ TWL LRG LVL3 (GOWN DISPOSABLE) ×4 IMPLANT
GOWN STRL REUS W/ TWL XL LVL3 (GOWN DISPOSABLE) ×2 IMPLANT
GOWN STRL REUS W/TWL LRG LVL3 (GOWN DISPOSABLE) ×2
GOWN STRL REUS W/TWL XL LVL3 (GOWN DISPOSABLE)
GOWN STRL SURGICAL XL XLNG (GOWN DISPOSABLE) ×2 IMPLANT
GRAFT BNE CANC CHIPS 1-8 20CC (Bone Implant) IMPLANT
GRAFT BNE FBR PLIAFX PRIME 10 (Bone Implant) IMPLANT
GUIDEWIRE NITINOL BEVEL TIP (WIRE) IMPLANT
HEMOSTAT SURGICEL 2X14 (HEMOSTASIS) IMPLANT
KIT BASIN OR (CUSTOM PROCEDURE TRAY) ×2 IMPLANT
KIT DILATOR XLIF 5 (KITS) IMPLANT
KIT POSITION SURG JACKSON T1 (MISCELLANEOUS) ×2 IMPLANT
KIT SHIM ACCESS MAX 4 STRL (ORTHOPEDIC DISPOSABLE SUPPLIES) IMPLANT
KIT SURGICAL ACCESS MAXCESS 4 (KITS) IMPLANT
KIT TURNOVER KIT B (KITS) ×2 IMPLANT
MODULE EMG NDL SSEP NVM5 (NEEDLE) IMPLANT
MODULE EMG NEEDLE SSEP NVM5 (NEEDLE) ×2 IMPLANT
NDL 22X1.5 STRL (OR ONLY) (MISCELLANEOUS) ×2 IMPLANT
NDL HYPO 25GX1X1/2 BEV (NEEDLE) ×2 IMPLANT
NDL SPNL 18GX3.5 QUINCKE PK (NEEDLE) ×2 IMPLANT
NEEDLE 22X1.5 STRL (OR ONLY) (MISCELLANEOUS) ×2 IMPLANT
NEEDLE HYPO 25GX1X1/2 BEV (NEEDLE) ×2 IMPLANT
NEEDLE SPNL 18GX3.5 QUINCKE PK (NEEDLE) ×2 IMPLANT
NS IRRIG 1000ML POUR BTL (IV SOLUTION) ×2 IMPLANT
PACK LAMINECTOMY ORTHO (CUSTOM PROCEDURE TRAY) ×2 IMPLANT
PACK UNIVERSAL I (CUSTOM PROCEDURE TRAY) ×2 IMPLANT
PAD ARMBOARD 7.5X6 YLW CONV (MISCELLANEOUS) ×4 IMPLANT
PATTIES SURGICAL .5 X.5 (GAUZE/BANDAGES/DRESSINGS) ×2 IMPLANT
PUTTY BONE DBX 5CC MIX (Putty) IMPLANT
ROD RELINE 5.5X500MM STRAIGHT (Rod) IMPLANT
SCREW CERV PA RELINE 8.5X80 (Screw) IMPLANT
SCREW CERV RELINE PA 8.5X70 (Screw) IMPLANT
SCREW LOCK RELINE 5.5 TULIP (Screw) IMPLANT
SCREW RELINE POLY 5.5X50MM (Screw) IMPLANT
SCREW RELINE-O POLY 6.5X45 (Screw) IMPLANT
SCREW RELINE-O POLY 6.5X50MM (Screw) IMPLANT
SCREW RELINE-O POLY 7.5X45 (Screw) IMPLANT
SCREW SET SINGLE INNER (Screw) IMPLANT
SPONGE INTESTINAL PEANUT (DISPOSABLE) ×4 IMPLANT
SPONGE SURGIFOAM ABS GEL 100 (HEMOSTASIS) ×4 IMPLANT
SPONGE T-LAP 4X18 ~~LOC~~+RFID (SPONGE) ×2 IMPLANT
STRIP CLOSURE SKIN 1/2X4 (GAUZE/BANDAGES/DRESSINGS) IMPLANT
SUCTION FRAZIER HANDLE 10FR (MISCELLANEOUS) ×4
SUCTION TUBE FRAZIER 10FR DISP (MISCELLANEOUS) ×2 IMPLANT
SURGIFLO W/THROMBIN 8M KIT (HEMOSTASIS) IMPLANT
SUT BONE WAX W31G (SUTURE) ×2 IMPLANT
SUT ETHILON 3 0 FSL (SUTURE) IMPLANT
SUT MNCRL AB 3-0 PS2 18 (SUTURE) IMPLANT
SUT MNCRL AB 4-0 PS2 18 (SUTURE) ×2 IMPLANT
SUT MNCRL+ AB 3-0 CT1 36 (SUTURE) ×2 IMPLANT
SUT MONOCRYL AB 3-0 CT1 36IN (SUTURE)
SUT PDS AB 1 CTX 36 (SUTURE) ×4 IMPLANT
SUT PROLENE 5 0 C 1 24 (SUTURE) IMPLANT
SUT SILK 2 0 TIES 10X30 (SUTURE) ×2 IMPLANT
SUT SILK 3 0 TIES 10X30 (SUTURE) ×2 IMPLANT
SUT VIC AB 0 CT1 18XCR BRD 8 (SUTURE) IMPLANT
SUT VIC AB 0 CT1 18XCR BRD8 (SUTURE) ×2 IMPLANT
SUT VIC AB 0 CT1 8-18 (SUTURE) ×10
SUT VIC AB 1 CT1 27 (SUTURE) ×2
SUT VIC AB 1 CT1 27XBRD ANBCTR (SUTURE) ×4 IMPLANT
SUT VIC AB 1 CTX 36 (SUTURE) ×2
SUT VIC AB 1 CTX36XBRD ANBCTR (SUTURE) ×4 IMPLANT
SUT VIC AB 2-0 CT1 18 (SUTURE) ×2 IMPLANT
SUT VIC AB 2-0 CT2 18 VCP726D (SUTURE) ×2 IMPLANT
SYR BULB IRRIG 60ML STRL (SYRINGE) ×2 IMPLANT
SYR CONTROL 10ML LL (SYRINGE) ×2 IMPLANT
SYSTEM NUVAMAP OR (SYSTAGENIX WOUND MANAGEMENT) IMPLANT
TOWEL GREEN STERILE (TOWEL DISPOSABLE) ×2 IMPLANT
TOWEL GREEN STERILE FF (TOWEL DISPOSABLE) ×2 IMPLANT
TRAY FOLEY W/BAG SLVR 16FR (SET/KITS/TRAYS/PACK) ×2
TRAY FOLEY W/BAG SLVR 16FR ST (SET/KITS/TRAYS/PACK) ×2 IMPLANT
WATER STERILE IRR 1000ML POUR (IV SOLUTION) ×2 IMPLANT
YANKAUER SUCT BULB TIP NO VENT (SUCTIONS) ×2 IMPLANT

## 2022-08-09 NOTE — Brief Op Note (Signed)
08/09/2022  9:21 PM  PATIENT:  Laura Solis  72 y.o. female  PRE-OPERATIVE DIAGNOSIS:  SPINAL STENOSIS WITH NEUROGENIC CLAUDICATION  POST-OPERATIVE DIAGNOSIS:  SPINAL STENOSIS WITH NEUROGENIC CLAUDICATION  PROCEDURE:  Procedure(s): Thoracic ten- PELVIS POSTERIOR INSTRUMENTED SPINAL FUSION, Thoracic twelve- Lumbar three LAMINECTOMIES AND FORAMINOTOMIES (N/A) Lumbar two-three XLIF (EXTREME LATERAL INTERBODY FUSION) (N/A) APPLICATION OF CELL SAVER (N/A)  SURGEON:  Surgeon(s) and Role:    Callie Fielding, MD - Primary  PHYSICIAN ASSISTANT:   ASSISTANTS: none   ANESTHESIA:   general  EBL:  500 mL   BLOOD ADMINISTERED: 150 CC CELLSAVER  DRAINS:  2 medium hemovacs out the back    LOCAL MEDICATIONS USED:  NONE  SPECIMEN:  No Specimen  DISPOSITION OF SPECIMEN:  N/A  COUNTS:  YES  TOURNIQUET None  DICTATION: .Note written in EPIC  PLAN OF CARE: Admit to inpatient   PATIENT DISPOSITION:  PACU - hemodynamically stable.   Delay start of Pharmacological VTE agent (>24hrs) due to surgical blood loss or risk of bleeding: yes

## 2022-08-09 NOTE — Transfer of Care (Signed)
Immediate Anesthesia Transfer of Care Note  Patient: [name]  Procedure(s) Performed: Thoracic ten- PELVIS POSTERIOR INSTRUMENTED SPINAL FUSION, Thoracic twelve- Lumbar three LAMINECTOMIES AND FORAMINOTOMIES (Spine Thoracic) Lumbar two-three XLIF (EXTREME LATERAL INTERBODY FUSION) (Spine Lumbar) APPLICATION OF CELL SAVER (Spine Lumbar)  Patient Location: PACU  Anesthesia Type:General  Level of Consciousness: awake, alert , and drowsy  Airway & Oxygen Therapy: Patient Spontanous Breathing and Patient connected to face mask oxygen  Post-op Assessment: Report given to RN and Post -op Vital signs reviewed and stable  Post vital signs: Reviewed and stable  Last Vitals:  Vitals Value Taken Time  BP 166/87 08/09/22 2145  Temp 36.8 C 08/09/22 2130  Pulse 112 08/09/22 2200  Resp 17 08/09/22 2200  SpO2 99 % 08/09/22 2200  Vitals shown include unvalidated device data.  Last Pain:  Vitals:   08/09/22 0643  TempSrc:   PainSc: 10-Worst pain ever      Patients Stated Pain Goal: 4 (02/77/41 2878)  Complications: No notable events documented.

## 2022-08-09 NOTE — Anesthesia Procedure Notes (Addendum)
Procedure Name: Intubation Date/Time: 08/09/2022 8:10 AM  Performed by: Lind Guest, CRNAPre-anesthesia Checklist: Patient identified, Emergency Drugs available, Suction available, Patient being monitored and Timeout performed Patient Re-evaluated:Patient Re-evaluated prior to induction Oxygen Delivery Method: Circle system utilized Preoxygenation: Pre-oxygenation with 100% oxygen Induction Type: IV induction Ventilation: Mask ventilation without difficulty Laryngoscope Size: Glidescope and 3 Grade View: Grade I Tube type: Oral Tube size: 7.0 mm Number of attempts: 1 Placement Confirmation: ETT inserted through vocal cords under direct vision, positive ETCO2 and breath sounds checked- equal and bilateral Secured at: 20 cm Tube secured with: Tape Dental Injury: Teeth and Oropharynx as per pre-operative assessment

## 2022-08-09 NOTE — H&P (Addendum)
Orthopedic Spine Surgery H&P Note  Assessment: Patient is a 72 y.o. female with low back pain that radiates into bilateral lower extremities consistent with neurogenic claudication and adjacent segment disease.    Plan: -Out of bed as tolerated, activity as tolerated, no brace -Covered the risks of surgery one more time with the patient and patient elected to proceed with planned surgery -Written consent verified -Hold anticoagulation in anticipation of surgery -Ancef on all to OR -NPO for procedure -Site marked -To OR when ready  The patient has symptoms consistent with lumbar stenosis. She also has LL-PI mismatch which was explained to her. The patient's symptoms were not getting any lasting improvement with conservative treatment so operative management was discussed in the form of T12-L3 laminectomy, T10-pelvis PSIF, and L2/3 XLIF. The risks including but not limited to dural tear (explained this risk would be higher with revision surgery), nerve root injury, paralysis,vascular injury, psoas weakness, lumbar plexus injury, persistent pain, pseudarthrosis, infection (also informed her that this risk is higher in a revision scenario), bleeding, hardware failure, adjacent segment disease, proximal junctional kyphosis, heart attack, death, stroke, fracture, blindness, and need for additional procedures were discussed with the patient. The benefit of the surgery would be improvement in the patient's bilateral leg pain and hopefully some relief of her back pain as well. I explained that back pain relief is not the main goal of the surgery and it is not reliably alleviated. The alternatives to surgical management were covered with the patient and included continued monitoring, physical therapy, over-the-counter pain medications, ambulatory aids, pain management, and activity modification. All the patient's questions were answered to their satisfaction. After this discussion, the patient expressed  understanding and elected to proceed with surgical intervention.     ___________________________________________________________________________  Chief Complaint: low back pain that radiates into legs  History: Patient is 72 y.o. female who has been previously seen in the office for low back pain that radiates into her legs. Feels the worst pain in her legs with walking. Pain improves with sitting or leaning forward. Her MRI showed stenosis above her old fusion so decompression and fusion was discussed with her. She also had decreased lumbar lordosis so XLIF was discussed to increase her lordosis. These symptoms failed to improve with conservative treatment so operative management was discussed at the last office visit. The patient presents today with no changes in their symptoms since the last office visit. See previous office note for further details.    Review of systems: General: denies fevers and chills, myalgias Neurologic: denies recent changes in vision, slurred speech Abdomen: denies nausea, vomiting, hematemesis Respiratory: denies cough, shortness of breath  Past medical history: Fibromyalgia Anxiety Irritable bowel syndrome Chronic pain Osteoarthritis Osteopenia (being treated with Vit D and calcium)   Allergies: Has had significant nausea with anesthesia, no other allergies   Past surgical history:  C3-5 ACDF Appendectomy Lumbar spine surgery x 3, currently has a L3-S1 PSIF Cystectomy Carpal tunnel release (bilateral) Rotator cuff repair bilaterally Tonsillectomy Bilateral TKA Tubal ligation Hysterectomy   Social history: Denies use of nicotine product (smoking, vaping, patches, smokeless) Alcohol use: Denies Denies recreational drug use  Family history: -reviewed and not pertinent to adjacent segment disease   Physical Exam:  General: no acute distress, appears stated age Neurologic: alert, answering questions appropriately, following  commands Cardiovascular: regular rate, no cyanosis Respiratory: unlabored breathing on room air, symmetric chest rise Psychiatric: appropriate affect, normal cadence to speech   MSK (spine):  -Strength exam  Left  Right  EHL    4/5  4/5 TA    5/5  5/5 GSC    5/5  5/5 Knee extension  5/5  5/5 Knee flexion   5/5  5/5 Hip flexion   4/5  4/5  -Sensory exam    Sensation intact to light touch in L3-S1 nerve distributions of bilateral lower extremities   Patient name: Laura Solis Patient MRN: [id number] Date: 08/09/22

## 2022-08-09 NOTE — Anesthesia Preprocedure Evaluation (Addendum)
Anesthesia Evaluation  Patient identified by MRN, date of birth, ID band Patient awake    Reviewed: Allergy & Precautions, NPO status , Patient's Chart, lab work & pertinent test results  History of Anesthesia Complications (+) PONV and history of anesthetic complications  Airway Mallampati: II  TM Distance: >3 FB Neck ROM: Full    Dental  (+) Dental Advisory Given   Pulmonary    breath sounds clear to auscultation       Cardiovascular negative cardio ROS  Rhythm:Regular Rate:Normal     Neuro/Psych  Neuromuscular disease    GI/Hepatic negative GI ROS, Neg liver ROS,,,  Endo/Other  negative endocrine ROS    Renal/GU negative Renal ROS     Musculoskeletal  (+) Arthritis ,  Fibromyalgia -  Abdominal   Peds  Hematology negative hematology ROS (+)   Anesthesia Other Findings   Reproductive/Obstetrics                             Anesthesia Physical Anesthesia Plan  ASA: 2  Anesthesia Plan: General   Post-op Pain Management: Tylenol PO (pre-op)*, Ketamine IV* and Sufentanil infusion   Induction: Intravenous  PONV Risk Score and Plan: 4 or greater and Dexamethasone, Ondansetron and Propofol infusion  Airway Management Planned: Oral ETT  Additional Equipment: Arterial line  Intra-op Plan:   Post-operative Plan: Extubation in OR and Possible Post-op intubation/ventilation  Informed Consent: I have reviewed the patients History and Physical, chart, labs and discussed the procedure including the risks, benefits and alternatives for the proposed anesthesia with the patient or authorized representative who has indicated his/her understanding and acceptance.     Dental advisory given  Plan Discussed with: CRNA  Anesthesia Plan Comments:        Anesthesia Quick Evaluation

## 2022-08-09 NOTE — Anesthesia Procedure Notes (Signed)
Arterial Line Insertion Start/End1/04/2023 8:20 AM, 08/09/2022 8:35 AM Performed by: Betha Loa, CRNA, Betha Loa, CRNA, CRNA  Preanesthetic checklist: patient identified, IV checked, site marked, risks and benefits discussed, surgical consent, monitors and equipment checked, pre-op evaluation and timeout performed Lidocaine 1% used for infiltration and patient sedated Right, radial was placed Catheter size: 20 G Hand hygiene performed  and maximum sterile barriers used  Allen's test indicative of satisfactory collateral circulation Attempts: 2 Procedure performed without using ultrasound guided technique. Following insertion, dressing applied and Biopatch. Post procedure assessment: normal  Patient tolerated the procedure well with no immediate complications.

## 2022-08-09 NOTE — Progress Notes (Addendum)
Pre-operative Scores  ODI: 66 VAS leg: 10 VAS back: 9 SF-36:  Physical functioning: 10  Role limitations due to physical health: 0  Role limitations due to emotional health: 100  Energy/fatigue: 30  Emotional well being: 7  Social functioning: 62.5  Pain: 10  General health: [place], MD Orthopedic Surgeon

## 2022-08-09 NOTE — Anesthesia Procedure Notes (Addendum)
Central Venous Catheter Insertion Performed by: Darral Dash, DO, anesthesiologist Start/End1/04/2023 7:28 AM, 08/09/2022 7:44 AM Patient location: Pre-op. Preanesthetic checklist: patient identified, IV checked, site marked, risks and benefits discussed, surgical consent, monitors and equipment checked, pre-op evaluation, timeout performed and anesthesia consent Position: Trendelenburg Lidocaine 1% used for infiltration and patient sedated Hand hygiene performed  and maximum sterile barriers used  Catheter size: 8 Fr Total catheter length 16. Central line was placed.Double lumen Procedure performed using ultrasound guided technique. Ultrasound Notes:anatomy identified, needle tip was noted to be adjacent to the nerve/plexus identified, no ultrasound evidence of intravascular and/or intraneural injection and image(s) printed for medical record Attempts: 1 Following insertion, dressing applied, line sutured and Biopatch. Post procedure assessment: blood return through all ports and free fluid flow  Patient tolerated the procedure well with no immediate complications.

## 2022-08-10 DIAGNOSIS — M48062 Spinal stenosis, lumbar region with neurogenic claudication: Secondary | ICD-10-CM | POA: Diagnosis present

## 2022-08-10 LAB — CBC
HCT: 33.3 % — ABNORMAL LOW (ref 36.0–46.0)
Hemoglobin: 10.8 g/dL — ABNORMAL LOW (ref 12.0–15.0)
MCH: 31.6 pg (ref 26.0–34.0)
MCHC: 32.4 g/dL (ref 30.0–36.0)
MCV: 97.4 fL (ref 80.0–100.0)
Platelets: 196 10*3/uL (ref 150–400)
RBC: 3.42 MIL/uL — ABNORMAL LOW (ref 3.87–5.11)
RDW: 13.8 % (ref 11.5–15.5)
WBC: 10.8 10*3/uL — ABNORMAL HIGH (ref 4.0–10.5)
nRBC: 0 % (ref 0.0–0.2)

## 2022-08-10 LAB — BASIC METABOLIC PANEL
Anion gap: 10 (ref 5–15)
BUN: 16 mg/dL (ref 8–23)
CO2: 24 mmol/L (ref 22–32)
Calcium: 8.3 mg/dL — ABNORMAL LOW (ref 8.9–10.3)
Chloride: 103 mmol/L (ref 98–111)
Creatinine, Ser: 1.16 mg/dL — ABNORMAL HIGH (ref 0.44–1.00)
GFR, Estimated: 50 mL/min — ABNORMAL LOW (ref >60)
Glucose, Bld: 154 mg/dL — ABNORMAL HIGH (ref 70–99)
Potassium: 4.7 mmol/L (ref 3.5–5.1)
Sodium: 137 mmol/L (ref 135–145)

## 2022-08-10 MED ORDER — ACETAMINOPHEN 500 MG PO TABS
1000.0000 mg | ORAL_TABLET | Freq: Three times a day (TID) | ORAL | Status: DC
  Administered 2022-08-10 – 2022-08-13 (×11): 1000 mg via ORAL
  Filled 2022-08-10 (×11): qty 2

## 2022-08-10 MED ORDER — OXYCODONE HCL 5 MG PO TABS
5.0000 mg | ORAL_TABLET | ORAL | Status: DC | PRN
  Administered 2022-08-10 – 2022-08-11 (×5): 10 mg via ORAL
  Filled 2022-08-10 (×5): qty 2

## 2022-08-10 MED ORDER — CEFAZOLIN SODIUM-DEXTROSE 2-4 GM/100ML-% IV SOLN
2.0000 g | Freq: Four times a day (QID) | INTRAVENOUS | Status: AC
  Administered 2022-08-10 (×3): 2 g via INTRAVENOUS
  Filled 2022-08-10 (×3): qty 100

## 2022-08-10 MED ORDER — METHOCARBAMOL 750 MG PO TABS
750.0000 mg | ORAL_TABLET | Freq: Four times a day (QID) | ORAL | Status: DC
  Administered 2022-08-10 – 2022-08-11 (×5): 750 mg via ORAL
  Filled 2022-08-10 (×5): qty 1

## 2022-08-10 MED ORDER — POLYETHYLENE GLYCOL 3350 17 G PO PACK
17.0000 g | PACK | Freq: Every day | ORAL | Status: DC
  Administered 2022-08-10 – 2022-08-12 (×3): 17 g via ORAL
  Filled 2022-08-10 (×3): qty 1

## 2022-08-10 MED ORDER — CALCIUM CARBONATE 1250 (500 CA) MG PO TABS
1.0000 | ORAL_TABLET | Freq: Every day | ORAL | Status: DC
  Administered 2022-08-10: 1250 mg via ORAL
  Filled 2022-08-10: qty 1

## 2022-08-10 MED ORDER — ONDANSETRON HCL 4 MG/2ML IJ SOLN: 4.0000 mg | Freq: Four times a day (QID) | INTRAMUSCULAR | Status: DC | PRN

## 2022-08-10 MED ORDER — SODIUM CHLORIDE 0.9 % IV SOLN: INTRAVENOUS | Status: DC

## 2022-08-10 MED ORDER — HYDROMORPHONE HCL 1 MG/ML IJ SOLN
1.0000 mg | INTRAMUSCULAR | Status: DC | PRN
  Administered 2022-08-10: 1 mg via INTRAVENOUS
  Filled 2022-08-10: qty 1

## 2022-08-10 MED ORDER — FLUOXETINE HCL 20 MG PO CAPS
20.0000 mg | ORAL_CAPSULE | Freq: Every day | ORAL | Status: DC
  Administered 2022-08-10 – 2022-08-12 (×3): 20 mg via ORAL
  Filled 2022-08-10 (×3): qty 1

## 2022-08-10 MED ORDER — PRAMIPEXOLE DIHYDROCHLORIDE 1.5 MG PO TABS
1.5000 mg | ORAL_TABLET | Freq: Every day | ORAL | Status: DC
  Administered 2022-08-10 – 2022-08-12 (×3): 1.5 mg via ORAL
  Filled 2022-08-10 (×5): qty 1

## 2022-08-10 MED ORDER — ALPRAZOLAM 0.5 MG PO TABS
0.5000 mg | ORAL_TABLET | Freq: Every day | ORAL | Status: DC
  Administered 2022-08-10: 0.5 mg via ORAL
  Filled 2022-08-10: qty 1

## 2022-08-10 MED ORDER — VALACYCLOVIR HCL 500 MG PO TABS
500.0000 mg | ORAL_TABLET | Freq: Every day | ORAL | Status: DC
  Administered 2022-08-10 – 2022-08-13 (×4): 500 mg via ORAL
  Filled 2022-08-10 (×4): qty 1

## 2022-08-10 MED ORDER — AMPHETAMINE-DEXTROAMPHETAMINE 10 MG PO TABS
10.0000 mg | ORAL_TABLET | Freq: Two times a day (BID) | ORAL | Status: DC
  Administered 2022-08-10 – 2022-08-12 (×3): 10 mg via ORAL
  Filled 2022-08-10 (×6): qty 1

## 2022-08-10 MED ORDER — GABAPENTIN 300 MG PO CAPS
300.0000 mg | ORAL_CAPSULE | Freq: Every day | ORAL | Status: DC
  Administered 2022-08-10: 300 mg via ORAL
  Filled 2022-08-10: qty 1

## 2022-08-10 MED ORDER — DOCUSATE SODIUM 100 MG PO CAPS
100.0000 mg | ORAL_CAPSULE | Freq: Two times a day (BID) | ORAL | Status: DC
  Administered 2022-08-10 – 2022-08-12 (×6): 100 mg via ORAL
  Filled 2022-08-10 (×6): qty 1

## 2022-08-10 MED ORDER — CHLORHEXIDINE GLUCONATE CLOTH 2 % EX PADS
6.0000 | MEDICATED_PAD | Freq: Every day | CUTANEOUS | Status: DC
  Administered 2022-08-10 – 2022-08-13 (×4): 6 via TOPICAL

## 2022-08-10 MED ORDER — TRANEXAMIC ACID-NACL 1000-0.7 MG/100ML-% IV SOLN
1000.0000 mg | Freq: Once | INTRAVENOUS | Status: AC
  Administered 2022-08-10: 1000 mg via INTRAVENOUS
  Filled 2022-08-10: qty 100

## 2022-08-10 MED ORDER — ONDANSETRON HCL 4 MG PO TABS: 4.0000 mg | ORAL_TABLET | Freq: Four times a day (QID) | ORAL | Status: DC | PRN

## 2022-08-10 MED FILL — Thrombin For Soln Kit 20000 Unit: CUTANEOUS | Qty: 1 | Status: AC

## 2022-08-10 NOTE — Progress Notes (Signed)
Patient arrive to the unit , drowsy but able to follow commands.Daughter at bedside. Patient denies pain. Dressing at the back is clean and dry. Cardiac tele connected to CCMD. Bed alarms on. Call button within reach.

## 2022-08-10 NOTE — Progress Notes (Signed)
This RN noticed right wrist PIV infiltration after starting Tranexamic acid for few minutes. RN immediately stopped the infusion, arm elevated, heat applied. Pt verbalized no pain in affected arm, skin looks tight and cool to touch.  At 0400 affected arm noticed to be soft to touch. Pt is more awake, no complaints of pain/ discomfort. Will continue to monitor.

## 2022-08-10 NOTE — Evaluation (Signed)
Occupational Therapy Evaluation Patient Details Name: Laura Solis MRN: [id number] DOB: [birth year] Today's Date: 08/10/2022   History of Present Illness Pt is a 72yo female admitted with spinal stenosis  for a T10-pelvis PSIF, L2,3 Xlif, T12-L3 laminectomy.  PMH: previous back surgeries, rotator cuff issues BLY, B knee replacement, fibromyalgia, anxiety.   Clinical Impression   Pt admitted with the above diagnosis and has the deficits outlined below. Pt would benefit from cont OT to increase independence with basic adls  and adl transfers so she can return home to living alone. Pt does have sister that lives next door and daughter close that can help her.  Pt is most limited by pain but did basic transfers on first time up with min assist from a high surface. Pt requires mod assist from lower surface. Pt may need some adaptive equipment to assist with LE dressing but if pain subsides, feel pt may be able to go home with HHOT and possibly a HHAid a few days a week and assist from sister and daughter. Will continue to see with focus on LE adls and adl transfers.     Recommendations for follow up therapy are one component of a multi-disciplinary discharge planning process, led by the attending physician.  Recommendations may be updated based on patient status, additional functional criteria and insurance authorization.   Follow Up Recommendations  Home health OT     Assistance Recommended at Discharge Frequent or constant Supervision/Assistance  Patient can return home with the following A little help with walking and/or transfers;A lot of help with bathing/dressing/bathroom;Assist for transportation;Help with stairs or ramp for entrance;Assistance with cooking/housework    Functional Status Assessment  Patient has had a recent decline in their functional status and demonstrates the ability to make significant improvements in function in a reasonable and predictable amount of time.  Equipment  Recommendations  None recommended by OT    Recommendations for Other Services       Precautions / Restrictions Precautions Precautions: Back Precaution Comments: No bending, twisting or arching or lifting over 10# Restrictions Weight Bearing Restrictions: No      Mobility Bed Mobility Overal bed mobility: Needs Assistance             General bed mobility comments: Pt in a great amount of pain on arrival. Bed was up at 70 degrees.  Did helicopter turn to EOB iwth pt moving feet and pushing up from bedrail with min assist but did not attempt getting up from supine due to pain.    Transfers Overall transfer level: Needs assistance Equipment used: Rolling walker (2 wheels) Transfers: Sit to/from Stand, Bed to chair/wheelchair/BSC Sit to Stand: Min assist, From elevated surface     Step pivot transfers: Min assist     General transfer comment: cues for hand placement with walker and cues to sit slowly.      Balance Overall balance assessment: Needs assistance Sitting-balance support: Feet supported Sitting balance-Leahy Scale: Fair     Standing balance support: During functional activity, Bilateral upper extremity supported Standing balance-Leahy Scale: Poor Standing balance comment: outside support from walker necessary                           ADL either performed or assessed with clinical judgement   ADL Overall ADL's : Needs assistance/impaired Eating/Feeding: Set up;Sitting   Grooming: Minimal assistance;Sitting   Upper Body Bathing: Minimal assistance;Sitting   Lower Body Bathing: Maximal assistance;Sit to/from  stand;Cueing for compensatory techniques;Cueing for back precautions   Upper Body Dressing : Moderate assistance;Sitting Upper Body Dressing Details (indicate cue type and reason): limited due to pain. Lower Body Dressing: Maximal assistance;Sit to/from stand;Cueing for compensatory techniques;Cueing for back precautions   Toilet  Transfer: Minimal assistance;Stand-pivot;BSC/3in1   Toileting- Clothing Manipulation and Hygiene: Total assistance;Sit to/from stand;Cueing for back precautions;Cueing for compensatory techniques Toileting - Clothing Manipulation Details (indicate cue type and reason): Pt stood while therapist cleaned pt     Functional mobility during ADLs: Minimal assistance;Rolling walker (2 wheels) General ADL Comments: Pt most limited by pain at this point. This is first time up since surgery. Feel this will improve.  Pt may benefit from adaptive equipment.     Vision Baseline Vision/History: 1 Wears glasses (glasses for reading and driving) Ability to See in Adequate Light: 0 Adequate Patient Visual Report: No change from baseline Vision Assessment?: No apparent visual deficits     Perception Perception Perception Tested?: No   Praxis Praxis Praxis tested?: Within functional limits    Pertinent Vitals/Pain Pain Assessment Pain Assessment: 0-10 Pain Score: 10-Worst pain ever Pain Location: back Pain Descriptors / Indicators: Aching, Throbbing, Sore, Guarding, Grimacing Pain Intervention(s): Limited activity within patient's tolerance, Monitored during session, Premedicated before session, Repositioned, Relaxation     Hand Dominance Left   Extremity/Trunk Assessment Upper Extremity Assessment Upper Extremity Assessment: RUE deficits/detail;LUE deficits/detail RUE Deficits / Details: WFL except R shoulder pain (rotator cuff) 4/5  L worse than R. RUE Sensation: WNL RUE Coordination: WNL LUE Deficits / Details: WFL except shoulder 3+/5 due to rotator cuff issues.  L worse than R LUE Sensation: WNL LUE Coordination: WNL   Lower Extremity Assessment Lower Extremity Assessment: Defer to PT evaluation   Cervical / Trunk Assessment Cervical / Trunk Assessment: Back Surgery;Other exceptions Cervical / Trunk Exceptions: 4 back surgeries   Communication Communication Communication: No  difficulties   Cognition Arousal/Alertness: Lethargic Behavior During Therapy: WFL for tasks assessed/performed Overall Cognitive Status: Impaired/Different from baseline Area of Impairment: Awareness, Safety/judgement                         Safety/Judgement: Decreased awareness of deficits, Decreased awareness of safety Awareness: Emergent   General Comments: Pt is having some basic intermintent confusion at times.  Asking questions that have no relation to what we are doing.  "What did you use the scissors for/"  What makes me beep?"  Pt easily redirected and is A&O.4 following all commands.     General Comments  Pt most limited by pain. Pt has 2 hemovac drains and an IV as well as foley catheter    Exercises     Shoulder Instructions      Home Living Family/patient expects to be discharged to:: Private residence Living Arrangements: Alone Available Help at Discharge: Friend(s);Available PRN/intermittently Type of Home: Mobile home Home Access: Stairs to enter Entrance Stairs-Number of Steps: 4 Entrance Stairs-Rails: Left Home Layout: One level     Bathroom Shower/Tub: Occupational psychologist: Handicapped height Bathroom Accessibility: Yes How Accessible: Accessible via walker Home Equipment: Advice worker (2 wheels);Cane - single point;BSC/3in1   Additional Comments: Pt's sister lives next door and daughter is also close. They can help her a lot in first few weeks.  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 Would like to get back to this but doesnt have to.      Prior Functioning/Environment Prior Level of Function :  Independent/Modified Independent             Mobility Comments: has used walker for last month due to pain ADLs Comments: mod I        OT Problem List: Decreased strength;Impaired balance (sitting and/or standing);Decreased cognition;Decreased knowledge of use of DME or AE;Decreased knowledge of  precautions;Impaired UE functional use;Pain      OT Treatment/Interventions: Self-care/ADL training;DME and/or AE instruction;Balance training;Therapeutic activities    OT Goals(Current goals can be found in the care plan section) Acute Rehab OT Goals Patient Stated Goal: to get rid of the pain OT Goal Formulation: With patient/family Time For Goal Achievement: 08/24/22 Potential to Achieve Goals: Good ADL Goals Pt Will Perform Grooming: with supervision;standing Pt Will Perform Lower Body Bathing: with supervision;sit to/from stand;with adaptive equipment Pt Will Perform Lower Body Dressing: with supervision;with adaptive equipment;sit to/from stand Pt Will Transfer to Toilet: with supervision;ambulating Pt Will Perform Toileting - Clothing Manipulation and hygiene: with supervision;with adaptive equipment Pt Will Perform Tub/Shower Transfer: Shower transfer;with supervision;ambulating;shower seat  OT Frequency: Min 2X/week    Co-evaluation              AM-PAC OT "6 Clicks" Daily Activity     Outcome Measure Help from another person eating meals?: None Help from another person taking care of personal grooming?: A Little Help from another person toileting, which includes using toliet, bedpan, or urinal?: A Lot Help from another person bathing (including washing, rinsing, drying)?: A Lot Help from another person to put on and taking off regular upper body clothing?: A Lot Help from another person to put on and taking off regular lower body clothing?: A Lot 6 Click Score: 15   End of Session Equipment Utilized During Treatment: Rolling walker (2 wheels) Nurse Communication: Mobility status  Activity Tolerance: Patient tolerated treatment well Patient left: in chair;with call bell/phone within reach;with chair alarm set  OT Visit Diagnosis: Unsteadiness on feet (R26.81)                Time: 3267-1245 OT Time Calculation (min): 39 min Charges:  OT General Charges $OT Visit:  1 Visit OT Evaluation $OT Eval Moderate Complexity: 1 Mod OT Treatments $Self Care/Home Management : 8-22 mins  Glenford Peers 08/10/2022, 10:13 AM

## 2022-08-10 NOTE — Anesthesia Postprocedure Evaluation (Signed)
Anesthesia Post Note  Patient: Laura Solis  Procedure(s) Performed: Thoracic ten- PELVIS POSTERIOR INSTRUMENTED SPINAL FUSION, Thoracic twelve- Lumbar three LAMINECTOMIES AND FORAMINOTOMIES (Spine Thoracic) Lumbar two-three XLIF (EXTREME LATERAL INTERBODY FUSION) (Spine Lumbar) APPLICATION OF CELL SAVER (Spine Lumbar)     Patient location during evaluation: PACU Anesthesia Type: General Level of consciousness: sedated and patient cooperative Pain management: pain level controlled Vital Signs Assessment: post-procedure vital signs reviewed and stable Respiratory status: spontaneous breathing Cardiovascular status: stable Anesthetic complications: no   No notable events documented.  Last Vitals:  Vitals:   08/10/22 1109 08/10/22 1522  BP: 133/67 (!) 136/59  Pulse: 87 96  Resp: 16 16  Temp: 36.7 C 36.8 C  SpO2: 94% 99%    Last Pain:  Vitals:   08/10/22 1522  TempSrc: Oral  PainSc:                  Nolon Nations

## 2022-08-10 NOTE — Progress Notes (Addendum)
.  Orthopedic Surgery Post-operative Progress Note  Assessment: Patient is a 72 y.o. female who is currently admitted after undergoing T10-pelvis PSIF, L2/3 XLIF, T12-L3 laminectomies   Plan: -Operative plans complete -Needs upright films when able -Drains to be maintained until output slows -Out of bed as tolerated, no brace -No bending/lifting/twisting greater than 10 pounds -PT/OT evaluate and treat -Pain control -Regular diet -No chemoprophylaxis for dvt or antiplatelets for 72 hours after surgery -Ancef x2 post-operative doses -Disposition: remain floor status  Hypocalcemia -Ca was 8.3 this morning, will start oral supplementation and monitor  AKI -creatinine was 1.16 from 0.72 pre-op -will start saline infusion and monitor with repeat BMP tomorrow  Acute anemia from surgical blood loss -Hgb was 10.8 this morning, will continue to monitor -Repeat CBC tomorrow  Fibromyalgia -restarted home prozac and gabapentin  Anxiety -restarted home xanax and prozac  Irritable bowel syndrome -started her on standard bowel regime with miralax and docusate -will monitor and adjust bowel regimen as needed  ___________________________________________________________________________   Subjective: No acute events overnight. Went to floor after PACU. Hypertension improved overnight. Confusion from anesthesia is improving. Having significant back pain but pain medications are helping. No radiating leg pains this morning.   Objective:  BP this morning was 129/62  General: no acute distress, laying in bed Neurologic: alert, orientated to person/place/time, answering questions appropriately, following commands Respiratory: unlabored breathing on 2L supplemental O2 Skin: dressing clear/dry/intact, drains with serosanguinous output  MSK (spine):  -Strength exam      Right  Left  EHL    4/5  4/5 TA    5/5  5/5 GSC    5/5  5/5 Knee extension  5/5  5/5 Hip flexion    4/5  4/5  -Sensory exam    Sensation intact to light touch in L3-S1 nerve distributions of bilateral lower extremities   Patient name: Laura Solis Patient MRN: [id number] Date: 08/10/22

## 2022-08-10 NOTE — Progress Notes (Signed)
.  Orthopedic Surgery Post-operative Progress Note  Assessment: Patient is a 72 y.o. female who is currently admitted after undergoing T10-pelvis PSIF, L2/3 XLIF, T12-L3 laminectomies   Plan: -Operative plans complete -Needs upright films when able -Drains to be maintained until output slows -Out of bed as tolerated, no brace -No bending/lifting/twisting greater than 10 pounds -PT evaluate and treat -Pain control -Regular diet -No chemoprophylaxis for dvt or antiplatelets for 72 hours after surgery -Ancef x2 post-operative doses -Disposition: admit to floor from PACU  ___________________________________________________________________________   Subjective: No acute events since surgery. Recovering in PACU. Has been hypertensive in PACU, but is trending down. Pain adequately controlled.   Objective:  General: no acute distress, laying in bed Neurologic: drowsy, not answering questions, following commands with repeat instruction Respiratory: unlabored breathing on supplemental O2 Skin: dressing clear/dry/intact, drains with serosanguinous output  MSK (spine):  -Strength exam      Right  Left  EHL    4/5  4/5 TA    5/5  5/5 GSC    5/5  5/5 Knee extension  5/5  5/5 Hip flexion   4/5  4/5  -Sensory exam    Sensation intact to light touch in L3-S1 nerve distributions of bilateral lower extremities   Patient name: Laura Solis Patient MRN: [id number] Date: 08/09/2022

## 2022-08-10 NOTE — Evaluation (Signed)
Physical Therapy Evaluation Patient Details Name: Laura Solis MRN: [id number] DOB: Oct 31, 1950 Today's Date: 08/10/2022  History of Present Illness  Pt is a 72yo female admitted with spinal stenosis  for a T10-pelvis PSIF, L2,3 Xlif, T12-L3 laminectomy.  PMH: previous back surgeries, rotator cuff issues BLY, B knee replacement, fibromyalgia, anxiety.  Clinical Impression  PT presents to PT with deficits in activity tolerance, functional mobility, gait, balance, power. Pt is limited by significant back pain, and requires cues to maintain back precautions during session. PT is unable to ambulate at this time due to pain, however PT encourages another attempt at out of bed mobility later in the day. PT is hopeful the pt will progress well with frequent mobility and be able to return home with HHPT and assistance from family.       Recommendations for follow up therapy are one component of a multi-disciplinary discharge planning process, led by the attending physician.  Recommendations may be updated based on patient status, additional functional criteria and insurance authorization.  Follow Up Recommendations Home health PT      Assistance Recommended at Discharge Intermittent Supervision/Assistance  Patient can return home with the following  A little help with walking and/or transfers;A little help with bathing/dressing/bathroom;Assistance with cooking/housework;Assist for transportation;Help with stairs or ramp for entrance    Equipment Recommendations None recommended by PT  Recommendations for Other Services       Functional Status Assessment Patient has had a recent decline in their functional status and demonstrates the ability to make significant improvements in function in a reasonable and predictable amount of time.     Precautions / Restrictions Precautions Precautions: Back Precaution Booklet Issued: No Precaution Comments: verbally reviewed back  precautions Restrictions Weight Bearing Restrictions: No      Mobility  Bed Mobility Overal bed mobility: Needs Assistance Bed Mobility: Sit to Sidelying, Rolling Rolling: Min guard       Sit to sidelying: Mod assist General bed mobility comments: verbal cues for log roll as pt reaching across body with UE, twisting to reach for opposite railing.    Transfers Overall transfer level: Needs assistance Equipment used: Rolling walker (2 wheels) Transfers: Sit to/from Stand Sit to Stand: Min guard   Step pivot transfers: Min guard            Ambulation/Gait Ambulation/Gait assistance:  (deferred 2/2 significant pain)                Stairs            Wheelchair Mobility    Modified Rankin (Stroke Patients Only)       Balance Overall balance assessment: Needs assistance Sitting-balance support: Feet supported, No upper extremity supported Sitting balance-Leahy Scale: Fair     Standing balance support: Bilateral upper extremity supported, Reliant on assistive device for balance Standing balance-Leahy Scale: Poor                               Pertinent Vitals/Pain Pain Assessment Pain Assessment: Faces Faces Pain Scale: Hurts whole lot Pain Location: back Pain Descriptors / Indicators: Aching Pain Intervention(s): Monitored during session, Limited activity within patient's tolerance    Home Living Family/patient expects to be discharged to:: Private residence Living Arrangements: Alone Available Help at Discharge: Family;Available PRN/intermittently Type of Home: Mobile home Home Access: Stairs to enter Entrance Stairs-Rails: Left Entrance Stairs-Number of Steps: 4   Home Layout: One level Home Equipment: Shower seat;Rolling  Walker (2 wheels);Cane - single point;BSC/3in1 Additional Comments: Pt's sister lives next door and daughter is also close. They can help her a lot in first few weeks.  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 Would like to get back to this but doesnt have to.    Prior Function Prior Level of Function : Independent/Modified Independent             Mobility Comments: has used walker for last month due to pain ADLs Comments: mod I     Hand Dominance   Dominant Hand: Left    Extremity/Trunk Assessment   Upper Extremity Assessment Upper Extremity Assessment: Defer to OT evaluation RUE Deficits / Details: WFL except R shoulder pain (rotator cuff) 4/5  L worse than R. RUE Sensation: WNL RUE Coordination: WNL LUE Deficits / Details: WFL except shoulder 3+/5 due to rotator cuff issues.  L worse than R LUE Sensation: WNL LUE Coordination: WNL    Lower Extremity Assessment Lower Extremity Assessment: Generalized weakness (possibly related to back pain)    Cervical / Trunk Assessment Cervical / Trunk Assessment: Back Surgery;Other exceptions Cervical / Trunk Exceptions: 4 back surgeries  Communication   Communication: No difficulties  Cognition Arousal/Alertness: Awake/alert Behavior During Therapy: WFL for tasks assessed/performed Overall Cognitive Status: Impaired/Different from baseline Area of Impairment: Memory                     Memory: Decreased recall of precautions                  General Comments General comments (skin integrity, edema, etc.): VSS on RA, drains intact    Exercises     Assessment/Plan    PT Assessment Patient needs continued PT services  PT Problem List Decreased strength;Decreased activity tolerance;Decreased balance;Decreased mobility;Decreased knowledge of use of DME;Decreased safety awareness;Decreased knowledge of precautions;Pain       PT Treatment Interventions DME instruction;Gait training;Stair training;Functional mobility training;Therapeutic activities;Therapeutic exercise;Balance training;Neuromuscular re-education;Patient/family education    PT Goals (Current goals can be found in the Care Plan section)   Acute Rehab PT Goals Patient Stated Goal: to reduce pain and return to independence PT Goal Formulation: With patient Time For Goal Achievement: 08/24/22 Potential to Achieve Goals: Good    Frequency Min 5X/week     Co-evaluation               AM-PAC PT "6 Clicks" Mobility  Outcome Measure Help needed turning from your back to your side while in a flat bed without using bedrails?: A Little Help needed moving from lying on your back to sitting on the side of a flat bed without using bedrails?: A Lot Help needed moving to and from a bed to a chair (including a wheelchair)?: A Little Help needed standing up from a chair using your arms (e.g., wheelchair or bedside chair)?: A Little Help needed to walk in hospital room?: Total Help needed climbing 3-5 steps with a railing? : Total 6 Click Score: 13    End of Session Equipment Utilized During Treatment:  (no brace needed) Activity Tolerance: Patient limited by pain Patient left: in bed;with call bell/phone within reach;with bed alarm set;with family/visitor present Nurse Communication: Mobility status PT Visit Diagnosis: Other abnormalities of gait and mobility (R26.89);Pain;Muscle weakness (generalized) (M62.81) Pain - part of body:  (back)    Time: 8413-2440 PT Time Calculation (min) (ACUTE ONLY): 20 min   Charges:   PT Evaluation $PT Eval Low Complexity: 1 Low  Arlyss Gandy, PT, DPT Acute Rehabilitation Office [phone]   Arlyss Gandy 08/10/2022, 10:49 AM

## 2022-08-10 NOTE — Progress Notes (Signed)
.  Orthopedic Surgery Post-operative Progress Note  Assessment: Patient is a 71 y.o. female who is currently admitted after undergoing T10-pelvis PSIF, L2/3 XLIF, T12-L3 laminectomies   Plan: -Operative plans complete -Needs upright films when able -Drains to be maintained until output slows -Out of bed as tolerated, no brace -No bending/lifting/twisting greater than 10 pounds -PT/OT evaluate and treat -Pain control -Regular diet -No chemoprophylaxis for dvt or antiplatelets for 72 hours after surgery -Ancef x2 post-operative doses -Disposition: remain floor status  Hypocalcemia -Ca was 8.3 this morning, will start oral supplementation and monitor  AKI -creatinine was 1.16 from 0.72 pre-op -will start saline infusion and monitor with repeat BMP tomorrow  Acute anemia from surgical blood loss -Hgb was 10.8 this morning, will continue to monitor -Repeat CBC tomorrow  Fibromyalgia -restarted home prozac and gabapentin  Anxiety -restarted home xanax and prozac  Irritable bowel syndrome -started her on standard bowel regime with miralax and docusate -will monitor and adjust bowel regimen as needed  ___________________________________________________________________________   Subjective: No acute events since seen this morning. Not having any leg pain. Feels that has improved since surgery. Reporting back pain around the incision and pain around the ASIS on the right side. No left sided thigh pain. Denies paresthesias and numbness.   Objective:  General: no acute distress, laying in bed Neurologic: alert, answering questions appropriately, following commands Respiratory: unlabored breathing on room air Skin: dressing clear/dry/intact, drains with serosanguinous output  MSK (spine):  -Strength exam      Right  Left  EHL    4/5  4/5 TA    5/5  5/5 GSC    5/5  5/5 Knee extension  5/5  5/5 Hip flexion   4/5  4/5  -Sensory exam    Sensation intact to light touch  in L3-S1 nerve distributions of bilateral lower extremities   Patient name: Laura Solis Patient MRN: [id number] Date: 08/10/22

## 2022-08-10 NOTE — TOC Initial Note (Signed)
Transition of Care [facility]) - Initial/Assessment Note    Patient Details  Name: Laura Solis MRN: [id number] Date of Birth: 1951-02-22  Transition of Care [facility]) CM/SW Contact:    Laura Friar, RN Phone Number: 08/10/2022, 11:51 AM  Clinical Narrative:                 Pt is from home alone. Her daughter is at the bedside. Daughter and patients sister to assist her at home. Recommendations are for home health services. CM provided the patient choice and she asked to use [facility]. She has used them before and asked to use them again. [facility] accepted. Information on the AVS. Pt's family can provide needed transportation and assist with medications at home.  TOC following for further d/c needs.   Expected Discharge Plan: [place] Barriers to Discharge: Continued Medical Work up   Patient Goals and CMS Choice   CMS Medicare.gov Compare Post Acute Care list provided to:: Patient Choice offered to / list presented to : Patient      Expected Discharge Plan and Services   Discharge Planning Services: CM Consult Post Acute Care Choice: [place]: PT, OT [facility] Agency: [place] Date [place]: 08/10/22      Prior Living Arrangements/Services   Lives with:: Self Patient language and need for interpreter reviewed:: Yes Do you feel safe going back to the place where you live?: Yes      Need for Family Participation in Patient Care: Yes (Comment) Care giver support system in place?: Yes (comment) Current home services: DME (cane/ walker/ lift chair/ shower seat) Criminal Activity/Legal Involvement Pertinent to Current Situation/Hospitalization: No - Comment as needed  Activities of Daily Living Home Assistive Devices/Equipment: Cane (specify quad or straight) (straight cane, front wheeled walker) ADL Screening (condition at time of admission) Patient's cognitive ability  adequate to safely complete daily activities?: Yes (Pt post op;daughter provided information) Is the patient deaf or have difficulty hearing?: No Does the patient have difficulty seeing, even when wearing glasses/contacts?: No Does the patient have difficulty concentrating, remembering, or making decisions?: No Patient able to express need for assistance with ADLs?: Yes Does the patient have difficulty dressing or bathing?: No Independently performs ADLs?: Yes (appropriate for developmental age) Does the patient have difficulty walking or climbing stairs?: Yes Weakness of Legs: Both Weakness of Arms/Hands: Left  Permission Sought/Granted                  Emotional Assessment Appearance:: Appears stated age Attitude/Demeanor/Rapport: Engaged Affect (typically observed): Accepting Orientation: : Oriented to Self, Oriented to Place, Oriented to Situation   Psych Involvement: No (comment)  Admission diagnosis:  Status post surgery [Z98.890] Lumbar stenosis with neurogenic claudication [M48.062] Patient Active Problem List   Diagnosis Date Noted   Lumbar stenosis with neurogenic claudication 08/10/2022   Status post surgery 08/09/2022   S/P cervical spinal fusion 04/21/2022   Stenosis of cervical spine with myelopathy ([place]) 04/13/2022   Cervical stenosis of spine 04/13/2022   S/P total knee arthroplasty 12/26/2019   Chronic pain of left knee 04/11/2018   Anemia associated with acute blood loss 01/17/2018    Class: Acute   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[place]) 01/16/2018    Class: Chronic   S/P lumbar spinal fusion  01/16/2018   Impingement syndrome of left shoulder 12/14/2017   S/P lumbar fusion 01/26/2017   Postoperative urinary retention 01/11/2017   Spinal stenosis of lumbar region 01/09/2017   Restless legs 11/14/2016   Osteoarthritis 11/14/2016   Mixed anxiety and depressive disorder 11/14/2016   Attention deficit hyperactivity disorder 11/14/2016   Anxiety 11/14/2016    Adult attention deficit hyperactivity disorder 11/14/2016   Osteoarthritis of right knee 10/03/2012   Fibromyalgia syndrome 10/03/2012   Obesity (BMI 30.0-34.9) 10/03/2012   PCP:  [name], DO Pharmacy:   [id number] - [place], [place] [address] [place] [address] Phone: [phone] Fax: [phone]     Social Determinants of Health (SDOH) Social History: SDOH Screenings   Food Insecurity: No Food Insecurity (08/10/2022)  Housing: Low Risk  (08/10/2022)  Transportation Needs: No Transportation Needs (08/10/2022)  Utilities: Not At Risk (08/10/2022)  Tobacco Use: Low Risk  (08/09/2022)   SDOH Interventions:     Readmission Risk Interventions     No data to display

## 2022-08-11 LAB — BASIC METABOLIC PANEL
Anion gap: 7 (ref 5–15)
BUN: 15 mg/dL (ref 8–23)
CO2: 27 mmol/L (ref 22–32)
Calcium: 8.3 mg/dL — ABNORMAL LOW (ref 8.9–10.3)
Chloride: 105 mmol/L (ref 98–111)
Creatinine, Ser: 0.79 mg/dL (ref 0.44–1.00)
GFR, Estimated: >60 mL/min (ref >60)
Glucose, Bld: 120 mg/dL — ABNORMAL HIGH (ref 70–99)
Potassium: 3.6 mmol/L (ref 3.5–5.1)
Sodium: 139 mmol/L (ref 135–145)

## 2022-08-11 LAB — CBC
HCT: 27.9 % — ABNORMAL LOW (ref 36.0–46.0)
Hemoglobin: 9.1 g/dL — ABNORMAL LOW (ref 12.0–15.0)
MCH: 32 pg (ref 26.0–34.0)
MCHC: 32.6 g/dL (ref 30.0–36.0)
MCV: 98.2 fL (ref 80.0–100.0)
Platelets: 141 10*3/uL — ABNORMAL LOW (ref 150–400)
RBC: 2.84 MIL/uL — ABNORMAL LOW (ref 3.87–5.11)
RDW: 13.9 % (ref 11.5–15.5)
WBC: 8.4 10*3/uL (ref 4.0–10.5)
nRBC: 0 % (ref 0.0–0.2)

## 2022-08-11 MED ORDER — FAMOTIDINE 10 MG PO TABS
10.0000 mg | ORAL_TABLET | Freq: Every day | ORAL | Status: DC | PRN
  Administered 2022-08-11: 10 mg via ORAL
  Filled 2022-08-11: qty 1

## 2022-08-11 MED ORDER — OXYCODONE HCL 5 MG PO TABS
2.5000 mg | ORAL_TABLET | ORAL | Status: DC | PRN
  Administered 2022-08-11 – 2022-08-13 (×4): 5 mg via ORAL
  Filled 2022-08-11 (×4): qty 1

## 2022-08-11 MED ORDER — CALCIUM CARBONATE 1250 (500 CA) MG PO TABS
1.0000 | ORAL_TABLET | Freq: Two times a day (BID) | ORAL | Status: DC
  Administered 2022-08-11 – 2022-08-12 (×3): 1250 mg via ORAL
  Filled 2022-08-11 (×3): qty 1

## 2022-08-11 MED ORDER — LIDOCAINE 5 % EX PTCH
1.0000 | MEDICATED_PATCH | CUTANEOUS | Status: DC
  Administered 2022-08-11 – 2022-08-12 (×2): 1 via TRANSDERMAL
  Filled 2022-08-11 (×2): qty 1

## 2022-08-11 NOTE — Progress Notes (Signed)
Physical Therapy Treatment Patient Details Name: Laura Solis MRN: [id number] DOB: Apr 06, 1951 Today's Date: 08/11/2022   History of Present Illness Pt is a 72yo female admitted with spinal stenosis  for a T10-pelvis PSIF, L2,3 Xlif, T12-L3 laminectomy.  PMH: previous back surgeries, rotator cuff issues BLY, B knee replacement, fibromyalgia, anxiety.    PT Comments    Pt greeted semi-reclined in bed and agreeable to session, however pt limited by pain and lethargy (suspect from medications as pt pre-medicated prior to session). Pt able to progress ambulation this session with mod assist to manage RW and steady pt, with pt needing max cues for safety and alertness. Pt continues to need mod assist for bed mobility and increased assist for transfers sit<>stand this date secondary to weakness and lethargy. Pt agreeable to time up in chair at end of session. Current plan remains appropriate to address deficits and maximize functional independence. Pt continues to benefit from skilled PT services to progress toward functional mobility goals.    Recommendations for follow up therapy are one component of a multi-disciplinary discharge planning process, led by the attending physician.  Recommendations may be updated based on patient status, additional functional criteria and insurance authorization.  Follow Up Recommendations  Home health PT (pending progress)     Assistance Recommended at Discharge Intermittent Supervision/Assistance  Patient can return home with the following A little help with walking and/or transfers;A little help with bathing/dressing/bathroom;Assistance with cooking/housework;Assist for transportation;Help with stairs or ramp for entrance   Equipment Recommendations  None recommended by PT    Recommendations for Other Services       Precautions / Restrictions Precautions Precautions: Back Precaution Booklet Issued: No Precaution Comments: verbally reviewed back  precautions Restrictions Weight Bearing Restrictions: No     Mobility  Bed Mobility Overal bed mobility: Needs Assistance Bed Mobility: Sit to Sidelying, Rolling Rolling: Min assist       Sit to sidelying: Mod assist, HOB elevated General bed mobility comments: verbal cues for log roll    Transfers Overall transfer level: Needs assistance Equipment used: Rolling walker (2 wheels) Transfers: Sit to/from Stand Sit to Stand: Mod assist, From elevated surface           General transfer comment: cues for hand placement with walker and cues to sit slowly. mod a to power up    Ambulation/Gait Ambulation/Gait assistance: Mod assist (deferred 2/2 significant pain) Gait Distance (Feet): 12 Feet Assistive device: Rolling walker (2 wheels) Gait Pattern/deviations: Step-to pattern, Decreased stride length, Antalgic, Narrow base of support Gait velocity: decr     General Gait Details: VERY slow antalgic gait with narrow BOS with heels almost touching, needing cues and physcial assist for RW proximity as pt pushing RW too far out in front and shifting RW to R with feet almost out of RW on L. MAX cues for safety and attention as pt intermittently closing eyes   Stairs             Wheelchair Mobility    Modified Rankin (Stroke Patients Only)       Balance Overall balance assessment: Needs assistance Sitting-balance support: Feet supported, No upper extremity supported Sitting balance-Leahy Scale: Fair     Standing balance support: Bilateral upper extremity supported, Reliant on assistive device for balance Standing balance-Leahy Scale: Poor Standing balance comment: outside support from walker necessary  Cognition Arousal/Alertness: Lethargic, Suspect due to medications Behavior During Therapy: WFL for tasks assessed/performed Overall Cognitive Status: Impaired/Different from baseline Area of Impairment: Memory                      Memory: Decreased recall of precautions   Safety/Judgement: Decreased awareness of deficits, Decreased awareness of safety Awareness: Emergent   General Comments: pt following all commands, able to recall precatutions with prompting        Exercises      General Comments General comments (skin integrity, edema, etc.): VSS on RA, drains intact      Pertinent Vitals/Pain Pain Assessment Pain Assessment: 0-10 Pain Score: 8  Pain Location: back Pain Descriptors / Indicators: Aching Pain Intervention(s): Monitored during session, Limited activity within patient's tolerance, Premedicated before session, Repositioned    Home Living                          Prior Function            PT Goals (current goals can now be found in the care plan section) Acute Rehab PT Goals PT Goal Formulation: With patient Time For Goal Achievement: 08/24/22 Progress towards PT goals: Progressing toward goals    Frequency    Min 5X/week      PT Plan      Co-evaluation              AM-PAC PT "6 Clicks" Mobility   Outcome Measure  Help needed turning from your back to your side while in a flat bed without using bedrails?: A Little Help needed moving from lying on your back to sitting on the side of a flat bed without using bedrails?: A Lot Help needed moving to and from a bed to a chair (including a wheelchair)?: A Little Help needed standing up from a chair using your arms (e.g., wheelchair or bedside chair)?: A Little Help needed to walk in hospital room?: A Lot Help needed climbing 3-5 steps with a railing? : Total 6 Click Score: 14    End of Session Equipment Utilized During Treatment:  (no brace needed) Activity Tolerance: Patient limited by pain;Patient limited by lethargy Patient left: with family/visitor present;in chair;with call bell/phone within reach;with chair alarm set Nurse Communication: Mobility status PT Visit Diagnosis: Other  abnormalities of gait and mobility (R26.89);Pain;Muscle weakness (generalized) (M62.81) Pain - part of body:  (back)     Time: 3762-8315 PT Time Calculation (min) (ACUTE ONLY): 27 min  Charges:  $Gait Training: 8-22 mins $Therapeutic Activity: 8-22 mins                     Tayte Childers R. PTA Acute Rehabilitation Services Office: [place] 08/11/2022, 9:25 AM

## 2022-08-11 NOTE — Op Note (Addendum)
Orthopedic Spine Surgery Operative Report  Procedure: L2/3 lateral lumbar interbody arthrodesis with insertion of interbody device Application of demineralized bone matrix  Modifier: none  Date of procedure: 08/09/2022  Patient name: Laura Solis MRN: [id number] DOB: 05-24-1951  Surgeon: Ileene Rubens, MD Assistant: None Pre-operative diagnosis: lumbar stenosis with neurogenic claudication, loss of lumbar lordosis, PI-LL mismatch Post-operative diagnosis: same as above Findings: T12-L3 enlarged and arthritic facets, hypertrophic ligamentum at those levels as well  Specimens: none Anesthesia: general EBL: 78LF Complications: none Pre-incision antibiotic: ancef  Implants:  Nuvasive 10 degree, 8x18x50 titanium interbody cage  Indication for procedure: Patient is a 72 y.o. female who presented to the office with symptoms consistent with lumbar stenosis with neurogenic claudication. The patient had tried conservative treatments that did not provide any lasting relief. As result, operative management was discussed. The pre-operative XR showed a PI-LL mismatch of 25 degrees. Her age adjusted mismatch is 11 degrees, so I discussed giving her additional lordosis with a lateral interbody fusion. L2/3 had not been fused and was the most caudal level not fused, so we talked about doing the interbody at that level. The risks including but not limited to dural tear, nerve root injury, paralysis, pseudarthrosis, persistent pain, infection, bleeding, hardware failure, adjacent segment disease, vascular injury, psoas weakness, lumbar plexus injury, heart attack, death, stroke, fracture, and need for additional procedures were discussed with the patient. The benefit of the surgery would be correction of her sagittal alignment and restoring some of her lumbar curvature. The alternatives to surgical management were covered with the patient and included continued monitoring, physical therapy, over-the-counter  pain medications, ambulatory aids, and activity modification. All the patient's questions were answered to her satisfaction. After this discussion, the patient expressed understanding and elected to proceed with surgical intervention.  Procedure Description: This was part of a larger procedure which is dictated separately.  Fluoroscopy was brought in prior to prepping and draping to mark out the disc space on an AP view. The C arm was then moved to a lateral position to mark out the anterior, posterior, and line of the L2/3 disc space. Once this was marked, the patient was prepped and draped in a standard, sterile fashion for both the posterior portion of the surgery and the lateral interbody (posterior and larger procedure dictated separately). A time out had been performed, confirming the patient identity, operative sites, and planned procedure. All team members were in agreement. Ancef and TXA had been given prior to incision by anesthesia staff. A transverse incision over the abdominal wall on the left side was made over the previously marked disc space through the skin and dermis. Electrocautery was used to continue the dissection through the subcutaneous tissue and fat to the level of the fascia overlying the external oblique muscle. Electrocautery was used to make an incision through the fascia in line with the muscle fibers. Metzenbaum scissors were used to dissect in line with the muscle fibers of the external oblique, the internal oblique, and then the transversus abdominis muscles. Metzenbaum scissors were used to open the transversalis fascia. A sponge stick and long kidner were utilized to sweep the retroperitoneal fat ventral to eventually visualize the psoas muscle.   The initial dilator was placed on the psoas fibers. An AP and lateral fluoroscopic image was taken to confirm dilator was at correct level and in proper position. The dilator was stimulated at that level and no response was  recorded under 20. The dilator was then rotated to bluntly  advance it through the psoas muscle. The position of the dilator was checked and adjusted under fluoroscopy until it was in the proper position near the posterior 1/3 of the disc space. The dilator was again stimulated and no reading was under 20 in any direction. A K wire was advanced through the dilator into the disc space to secure the dilator's position. Sequential dilators were placed over the initial dilator and rotated to bluntly dissect to the level of the disc space. Each time a new dilator was placed, it was stimulated in all directions and no readings were under 20. The retractor was then advanced over the dilators to the level of the disc space. Fluoroscopy was used to adjust and align the retractor to be parallel to the disc space. The retractor was locked into place using the bed attachment. The position of the retractor was checked again on AP and lateral fluoroscopy. The retractor was in a satisfactory position. The stimulator probe was then placed along the posterior aspect of the retractor and the lowest stimulation reading was 18. The shim was then advanced down the retractor into the disc space. The dilators and K wire were removed. The cranial/caudal aspect of the retractor was opened and the anterior blade of the retractor was advanced ventrally by six clicks. A bipolar and long kidner were used to remove the remaining soft tissue off of the disc space. A rectangular annulotomy was made in the disc space. A pituitary was used to remove some of the disc. A Cobb was placed into the disc space and advanced along the endplate to clear cartilaginous and disc material off of the endplate under fluoroscopic guidance. The Cobb was impacted to advance it through the annulus on the contralateral side. The Cobb was rotated and the other end plate was prepared in the same fashion. More disc was removed with a pituitary. Then, a combination of  ringed curettes, straight curettes, curved curettes, and pituitaries were used to remove the remaining disc until the endplates were well prepared with cartilaginous and disc material removed off of them. A series of trials were advanced across the disc space under AP fluoroscopic guidance until a good fit was obtained. This occurred with a 68mm trial. Neuromonitoring signals were checked and were unchanged from baseline. This size interbody by 53mm with 10 degrees of lordosis was selected for implantation. Demineralized bone matrix was packed into interbody device. The interbody device was placed into the disc space and advanced under fluoroscopic guidance until it was across the disc space. AP and lateral fluoroscopic images were taken and confirmed satisfactory position of the implant. Neuromonitoring signals were checked and were unchanged from baseline. The retractor blades were then closed for a total retractor time of 29 minutes. The retractor was then slowly removed and any bleeding was coagulated with a bipolar. Final AP and lateral fluoroscopic images were taken and confirmed satisfactory position of the implant.   The transversalis fascia was reapproximated with 0 vicryl. The internal and external oblique muscular layers were reapproximated with 0 vicryl. The fascia overlying the external oblique muscle was reapproximated with 0 vicryl. The deept dermal layer was reapproximated with 2-0 vicryl. The skin was closed with 3-0 monocryl. The incision was dressed with dermabond and an island dressing.   Post-operative plan: The patient remained intubated and in the operating room after this procedure for the remaining portion of the surgery. The drapes were left in place since the posterior spine was draped in at the beginning  of this procedure. The posterior portion of the case was completed in the same trip to the operating room and will be dictated separately.   Willia Craze, MD Orthopedic Surgeon

## 2022-08-11 NOTE — Plan of Care (Signed)
  Problem: Skin Integrity: Goal: Risk for impaired skin integrity will decrease Outcome: Progressing   Problem: Safety: Goal: Ability to remain free from injury will improve Outcome: Progressing   Problem: Pain Managment: Goal: General experience of comfort will improve Outcome: Progressing   Problem: Nutrition: Goal: Adequate nutrition will be maintained Outcome: Progressing   Problem: Activity: Goal: Risk for activity intolerance will decrease Outcome: Progressing   Problem: Education: Goal: Knowledge of General Education information will improve Description: Including pain rating scale, medication(s)/side effects and non-pharmacologic comfort measures Outcome: Progressing

## 2022-08-11 NOTE — Op Note (Addendum)
Orthopedic Spine Surgery Operative Report  Procedure: T12-L3 lumbar laminectomy with partial facetectomies and foraminotomies T10-L3 posterior lumbar spinal arthrodesis T10-S1 pedicle screw instrumentation with rods Application of bilateral S2AI pelvic screws Application of locally harvested autograft (spinous processes, removed lamina) Application of allograft morselized bone and demineralized bone matrix  Modifier: none  Date of procedure: 08/09/2022  Patient name: BRYTON WAIGHT MRN: [id number] DOB: 09-Mar-1951  Surgeon: Ileene Rubens, MD Assistant: None Pre-operative diagnosis: lumbar stenosis with neurogenic claudication, loss of lumbar lordosis, PI-LL mismatch  Post-operative diagnosis: same as above Findings: T12-L3 enlarged and arthritic facets, hypertrophic ligamentum at those levels as well   Specimens: none Anesthesia: general EBL: 480cc Cell saver was used and 150cc was returned Complications: None Pre-incision antibiotic: ancef  Implants:  Implant Name Type Inv. Item Serial No. Manufacturer Lot No. LRB No. Used Action  CONNECTOR EXPEDIUM SFX SZ A6 - SN/A Orthopedic Implant CONNECTOR EXPEDIUM SFX SZ A6 N/A [employer] N/A Left 1 Explanted  ROD 5.5X65MM - [id number] Rod ROD 5.5X65MM  [employer] SPINE   1 Explanted  ROD EXPEDIUM PREBENT 95MM - [id number] Rod ROD EXPEDIUM PREBENT 95MM  [employer] SPINE  N/A 1 Explanted  CAP LOCKING - SN/A Cap CAP SPINAL LOCKING TI N/A [employer] SPINE N/A Left 6 Explanted  CAP LOCKING - [id number] Cap CAP SPINAL LOCKING TI  [employer] SPINE   2 Explanted  SCREW VIPER 7X45MM - [id number] Screw SCREW VIPER 7X45MM  [employer] SP[employer]  N/A 2 Explanted  SCREW CORT FIX FEN 5.5X7X40MM - [id number] Screw SCREW CORT FIX FEN 5.5X7X40MM  [employer] SPINE  N/A 2 Explanted  CONNECTOR Z ROD UNIVERSAL 5.5 - [id number] Connector CONNECTOR Z ROD UNIVERSAL 5.5  [employer] SPINE  N/A 1 Explanted  SCREW RELINE-O POLY 6.5X45 -  [id number] Screw SCREW RELINE-O POLY 6.5X45  [employer]  N/A 6 Implanted  SCREW RELINE POLY 5.5X50MM - [id number] Screw SCREW RELINE POLY 5.5X50MM  [employer]  N/A 4 Implanted  SCREW RELINE-O POLY 6.5X50MM - [id number] Screw SCREW RELINE-O POLY 6.5X50MM  [employer]  N/A 1 Implanted  SCREW RELINE-O POLY 7.5X45 - [id number] Screw SCREW RELINE-O POLY 7.5X45  [employer]  N/A 1 Implanted  SCREW CERV RELINE PA 8.5X70 - [id number] Screw SCREW CERV RELINE PA 8.5X70  [employer]  N/A 2 Implanted  CAGE MODULUS XL 8X18X50 - 10 - [id number] Cage CAGE MODULUS XL 8X18X50 - 10  [employer] [id number] N/A 1 Implanted  PUTTY BONE DBX 5CC MIX - [id number] Putty PUTTY BONE DBX 5CC MIX [id number] [employer]LE[employer] TRANSPLANT FNDN  N/A 1 Implanted  SCREW LOCK RELINE 5.5 [place] - [id number] Screw SCREW LOCK RELINE 5.5 TULIP  [employer]  N/A 14 Implanted  ROD RELINE 5.5X500MM STRAIGHT - [id number] Rod ROD RELINE 5.5X500MM STRAIGHT  [employer]  N/A 2 Implanted  SCREW SET SINGLE INNER - [id number] Screw SCREW SET SINGLE INNER  [employer] SP[employer]  N/A 6 Implanted  BONE FIBERS PLIAFX 10 - [id number] Bone Implant BONE FIBERS PLIAFX 10 [id number] [employer]  N/A 1 Implanted  BONE FIBERS PLIAFX 10 - [id number] Bone Implant BONE FIBERS PLIAFX 10 [id number] [employer]  N/A 1 Implanted  BONE [facility] CHIPS 20CC PCAN1/4 - [id number] Bone Implant BONE CANC CHIPS 20CC PCAN1/4 [id number] [employer]  N/A 1 Implanted      Indication for procedure: Patient is a 72 y.o. female who presented to the office with symptoms consistent with lumbar stenosis and neurogenic claudication. She was  also noted to have PI-LL mismatch. The patient had tried conservative treatments that did not provide any lasting relief. As result, operative management was discussed. The pre-operative MRI showed stenosis from T12-L3 and a disc herniation at T10/11 so T12-L3 laminectomies with T10-pelvis fusion presented  as a treatment option. The risks including but not limited to dural tear, nerve root injury, paralysis, persistent pain, pseudarthrosis, infection, bleeding, hardware failure/malposition, proximal junctional kyphosis, adjacent segment disease, heart attack, death, stroke, fracture, blindness, and need for additional procedures were discussed with the patient. The benefit of the surgery would be relief of the patient' leg pain and hopefully some of her back pain, but explained that the back pain is not reliably relieved with surgery. The alternatives to surgical management were covered with the patient and included continued monitoring, physical therapy, over-the-counter pain medications, ambulatory aids, injections, and activity modification. All the patient's questions were answered to her and her daughter's satisfaction. After this discussion, the patient expressed understanding and elected to proceed with surgical intervention.    Procedure Description: The patient was met in the pre-operative holding area. The patient's identity and consent were verified. The operative site was marked. The patient's remaining questions about the surgery were answered. The patient was brought back to the operating room. General anesthesia was induced and an endotracheal tube was placed by the anesthesia staff. The neuromonitoring technologist attached leads to the patient. The patient was transferred to the prone [place] table in the prone position. All bony prominences were well padded. The head of the bed was slightly elevated and the eyes were free from compression by the face pillow. An electric razor was used to remove some fine hair over the lumbar region. The surgical area was cleansed with alcohol. Fluoroscopy was then brought in to check rotation on the AP image and to mark the levels on the lateral image. The lateral incision was also marked at this time and is dictated separately. 2g of ancef and 1g of TXA were given  prior to incision. The patient's skin was then prepped and draped in a standard, sterile fashion. A time out was performed that identified the patient, the procedure, and the operative levels. All team members agreed with what was stated in the time out. Baseline neuromonitoring signals were established.   A midline incision over the spinous processes of the previously marked levels was made and sharp dissection was continued down through the skin and dermis. Electrocautery was then used to continue the midline dissection down to the level of the spinous process from T10-L2 at the levels that had not undergone prior surgery. Care was taken to preserve the midline ligamentous structures from T10-T12. Subperiosteal dissection was performed using electrocautery to expose the lamina, the facets and transverse processes from T10-L2. Once the depth was determined from this dissection, incision was extended caudally in line with her prior incision. Electrocautery was used to dissect through the subcutaneous fat and scar tissue to a depth just deeper than the exposed spinous processes cranially. Electrocautery as then used to dissect laterally to fine the previous instrumentation bilaterally. Electrocautery was used to remove the soft tissue off of the L3-S1 screws and rods bilaterally. It was also used to clear the soft tissue off the cross link. There was some bone overlying part of both rods. An osteotome was used to clear the bone off of the rods. The cross link screws were then loosened. A curved curette was used to free it from soft tissue ventrally. The curved  curette was then placed under the cross connector and elevated to disconnect it from the rods. It was successfully removed. The set caps were then removed from the prior screws. The screws were tested and none appeared loss and there was no motion seen between the segments when the screws were manipulated. There was also fusion mass seen under the rods and  laterally so it appeared she had successfully fused from L3-S1. The left sided screws appeared lined up so they were left in place. The L5 and S1 screws on the right did not appear to be well aligned and there was a significant bend in the rod to connect the L4 and L5 screws, so the L5 and S1 screws were removed with plan to revise them.   Next, the pedicle screws were placed. The starting points for the pedicle screws were identified using anatomical landmarks. Fluoroscopy was brought in in the AP view to confirm the start points. Each pedicle screw was placed under AP fluoroscopy. A true AP was obtained of the vertebra where screw was being placed. Then, a pilot hole was made with a 81mm matchstick burr at the start point. A pedicle finder was advanced into the pilot hole. AP fluoroscopy was used to confirm proper starting point. A image was taken when the pedicle finder had been advanced 39mm, 52mm, and at 55mm. The pedicle finder remained within the pedicle and did not breach medially at any of those markers. The pedicle finder was then advanced to 47mm and then withdrawn. A feeler was placed into the hole and confirmed no pedicle wall breaches. It was advanced to the ventral cortex by palpation. The screw length was then estimated off of the depth of the feeler. The widths of the screws had been predetermined as they were measured pre-operatively on the patient's advanced imaging. That measured length and pre-determined width screw was then inserted under AP fluoroscopic guidance. The screw was advanced approximately 55mm, 58mm, and then 56mm. At each one of those intervals an image was taken to confirm that the screw was following the trajectory of the pedicle and had not breached medially. The screw was then advanced fully until there was good purchase. This process was repeated for every pedicle screw.    For the pelvis screws, a teardrop view was obtained and an awl was placed over the start point. The  awl was advanced into the tear drop and continued to be advanced into the tear drop until the cortical edge was reached. The length of the screw was estimated off of the awl. A guidewire was then advanced through the awl. The awl was removed and a tap was advanced over the guidewire. The awl was removed and a 8.5x80 screw was advanced over the wire. The wire was removed. On the right side, an AP image was taken which showed the screw up against the hip joint and into the subchondral bone. The screw was removed and revised. After the screw was removed, a probe was inserted and there was a bony end point at the bottom of the tract. An AP fluoroscopic shot was taken that showed the probe at the subchondral bone of the hip. Decision was made to revise the tract and screw placement, so the same technique was repeated to place the screw aimed more cranially. AP fluoroscopy and tear drop views confirmed satisfactory position of the bilateral pelvis screws at this time.    The screws that were inserted were [employer] Reline:  Left                  Right T10 6.5x45  6.5x45 T11 6.5x45  6.5x45 T12 6.5x50  6.5x50 L1 5.5x50  5.5x50 L2 5.5x50  5.5x50 L3 -  - L4 -  - L5 -  6.5x50 S1 -  7.5x45 Pelvis  8.5x80  8.5x80   AP and lateral fluoroscopic images were then taken to visualize all the pedicle screws. The screws were in satisfactory position. Screws were then stimulated. Every screw stimulated >20, except the right L5 and S1 hich stimulated at 17 and 18, respectively.   A rongeur was used to remove the spinous processes and interspinous ligaments between the inferior aspect of the T12 spinous process and the L2 spinous process. The spinous processes, but not the interspinous ligaments or soft tissue, were saved and morselized on the back table. Bone wax was used to obtain hemostasis at the bleeding bony surfaces. A high-speed burr was used to thin the lamina from T12 to L2 The caudal aspect of the  L2 was left in place since this was near the old laminectomies and was going to be delay with once normal anatomy had been found. The lamina of T12 and L1 were thinned to the level of the ligamentum, Cranial to the level of the ligamentum, the lamina was thinned with the burr to the approximate level of the ligamentum. A series of Kerrison rongeurs were used to remove the remaining lamina and ligamentum overlying the thecal sac from T12 to L1. The kerrisons were also used to remove the medial facet overlying the thecal sac bilaterally at those levels. Once the thecal sac depth had been established, a rongeur was used to remove the scar tissue overlying the caudal aspect of L2 and over L3. The burr was used to thin down the lamina of L2. A 2 and 3 kerrison were then used to remove the ligamentum and bone overlying the thecal sac from a cranial to caudal direction, starting from where normal anatomy had been established. The laminectomy was taken down past the point of the L3 pedicle and a woodsen was able to be passed into each of the L3/4 foramen.A woodsen was placed into the foramen at T12/L1 on the right side. A 2 kerrison was then placed over the woodsen to clear soft tissue and bone out of the foramen. This process was repeated to open the foramen at T12/L1 bilaterally and L1/2 bilaterally.   A woodsen was placed into the laminectomy site to palpate for any remaining areas of stenosis. Once it was confirmed with the woodsen that decompression had been completed from the medial pedicle wall to the contralateral medial pedicle wall and from the pedicle of L3 to the pedicle of T12, decompression was determined to be completed. Fluoroscopic images were taken to demonstrate the extent of the decompression. Bendini was brought in to develop a template for the rods. The bilateral rods were then bent on the back table with the help of the bendini template. The transverse processes and remaining facet joints and pars  from T10-L3 were decorticated with a high speed burr. The right rod was placed into the pedicle screws. Set screws were tightened over the rod into each pedicle screw. The same process was repeated for the left side. All the set screws were final tightened. Final AP and lateral fluoroscopic images were taken demonstrating the laminectomy defect and satisfactory position of the instrumentation and interbody device.   The locally harvested and  morselized autograft, demineralized bone matrix, and cancellous chips were placed over the decorticated bone. All bony fragments that inadvertently went into the laminectomy defect were removed. The decompressed area was again palpated with a woodsen which confirmed satisfactory decompression.   1g of vancomycin powder was placed into the wound. A medium hemovac drain was placed deep to the fascia. The fascia was reapproximated with 0 vicryl suture. Final neuromonitoring signals were checked and there was no change from baseline. A subcutaneous hemovac drain was placed above the fascia. The subcutaneous fat was reapproximated with 0 vicryl suture. The deep dermal layer was reapproximated with 2-0 viryl. The skin was closed with a 3-0 running moncryl. All counts were correct at the end of the case. The incision was dressed with steri strips and benzoine. An island dressing was placed over the wound. The patient was transferred back to a bed and brought to the post-anesthesia care unit by anesthesia staff in stable condition.  Post-operative plan: The patient will recover in the post-anesthesia care unit and then go to the floor. The patient will receive two post-operative doses of ancef. The patient will be out of bed as tolerated with no brace. The patient will work with physical therapy. The drains will be removed once output slows. The patient's disposition will be determined based off how they are doing on the floor post-operatively.       Willia Craze,  MD Orthopedic Surgeon

## 2022-08-11 NOTE — Progress Notes (Signed)
Orthopedic Surgery Post-operative Progress Note  Assessment: Patient is a 72 y.o. female who is currently admitted after undergoing T10-pelvis PSIF, L2/3 XLIF, T12-L3 laminectomies   Plan: -Operative plans complete -Needs upright films when able -Anticipate drain removal tomorrow -Out of bed as tolerated, no brace -No bending/lifting/twisting greater than 10 pounds -PT/OT evaluate and treat -Pain control -Regular diet -Encouraged incentive spirometry -No chemoprophylaxis for dvt or antiplatelets for 72 hours after surgery -Ancef x2 post-operative doses -Disposition: remain floor status  Hypocalcemia -Ca was 8.3 this morning, will continue with oral supplementation  AKI -creatinine was 1.16, now 0.79 and has returned to baseline   Acute anemia from surgical blood loss -Hgb was 9.1 this morning, will continue to monitor -Repeat CBC tomorrow  Fibromyalgia -restarted home prozac. Discontinue gabapentin as she has been sedated today  Anxiety -restarted home prozac. Discontinue xanax as she has been sedated today  Irritable bowel syndrome -started her on standard bowel regime with miralax and docusate -will monitor and adjust bowel regimen as needed  ___________________________________________________________________________   Subjective: No acute events since seen this morning. Was able to walk with PT with a walker. Did not have leg pain with therapy. Still having back pain. Has ben tired and sedated since therapy.   Objective:  General: no acute distress, sitting upright in bed Neurologic: alert, answering questions appropriately, following commands Respiratory: unlabored breathing on supplemental O2 Skin: dressing clear/dry/intact, drains with serosanguinous output  MSK (spine):  -Strength exam      Right  Left  EHL    4/5  5/5 TA    5/5  5/5 GSC    5/5  5/5 Knee extension  5/5  5/5 Hip flexion   4/5  4/5  -Sensory exam    Sensation intact to light  touch in L3-S1 nerve distributions of bilateral lower extremities   Patient name: Laura Solis Patient MRN: [id number] Date: 08/11/22

## 2022-08-11 NOTE — Progress Notes (Signed)
Orthopedic Surgery Post-operative Progress Note  Assessment: Patient is a 72 y.o. female who is currently admitted after undergoing T10-pelvis PSIF, L2/3 XLIF, T12-L3 laminectomies   Plan: -Operative plans complete -Needs upright films when able -Drains to be maintained until output slows -Out of bed as tolerated, no brace -No bending/lifting/twisting greater than 10 pounds -PT/OT evaluate and treat -Pain control -Regular diet -No chemoprophylaxis for dvt or antiplatelets for 72 hours after surgery -Ancef x2 post-operative doses -Disposition: remain floor status  Hypocalcemia -Ca was 8.3 this morning, will continue with oral supplementation  AKI -creatinine was 1.16, now 0.79 and has returned to baseline   Acute anemia from surgical blood loss -Hgb was 9.1 this morning, will continue to monitor -Repeat CBC tomorrow  Fibromyalgia -restarted home prozac and gabapentin  Anxiety -restarted home xanax and prozac  Irritable bowel syndrome -started her on standard bowel regime with miralax and docusate -will monitor and adjust bowel regimen as needed  ___________________________________________________________________________   Subjective: No acute events overnight. O2 saturations dropped overnight so she was placed on 1L of O2. Sat up in the chair to eat dinner for about an hour last night. Having pain especially with transition in position. Once she is in a position, her pain is tolerable. Only having back pain this morning. No leg pain. Denies paresthesias and numbness.   Objective:  General: no acute distress, sitting upright in bed Neurologic: alert, answering questions appropriately, following commands Respiratory: unlabored breathing on supplemental O2 Skin: dressing clear/dry/intact, drains with serosanguinous output  MSK (spine):  -Strength exam      Right  Left  EHL    4/5  5/5 TA    5/5  5/5 GSC    5/5  5/5 Knee extension  5/5  5/5 Hip flexion    4/5  4/5  -Sensory exam    Sensation intact to light touch in L3-S1 nerve distributions of bilateral lower extremities   Patient name: Laura Solis Patient MRN: [id number] Date: 08/11/22

## 2022-08-11 NOTE — Progress Notes (Signed)
Mobility Specialist Progress Note   08/11/22 1626  Mobility  Activity Transferred from bed to chair  Level of Assistance Moderate assist, patient does 50-74%  Assistive Device Front wheel walker  Distance Ambulated (ft) 2 ft  Range of Motion/Exercises Active;All extremities  Activity Response Tolerated well   Patient received in supine and agreeable to participate. Preferred to be seen later before dinner so she could eat in the recliner chair. Required mod A for bed mobility and to stand + cues for hand placement. Was able to take minimal steps to recliner chair but was limited by 8/10 pain.   Tolerated without complaint or incident. Was left in recliner with all needs met, call bell in reach.   [place] Nivek Powley, BS EXP Mobility Specialist Please contact via SecureChat or Rehab office at [phone]

## 2022-08-11 NOTE — TOC Progression Note (Signed)
Transition of Care [facility]) - Progression Note    Patient Details  Name: Laura Solis MRN: [id number] Date of Birth: 11/05/1950  Transition of Care [facility] CM/SW Contact  Pollie Friar, RN Phone Number: 08/11/2022, 1:45 PM  Clinical Narrative:    CM has updated [facility] that no d/c today. CM has faxed them the home health orders to: [phone] TOC following.   Expected Discharge Plan: [place] Barriers to Discharge: Continued Medical Work up  Expected Discharge Plan and Services   Discharge Planning Services: CM Consult Post Acute Care Choice: [place]: PT, OT [facility] Agency: [place] Date [place]: 08/10/22       Social Determinants of Health (SDOH) Interventions SDOH Screenings   Food Insecurity: No Food Insecurity (08/10/2022)  Housing: Low Risk  (08/10/2022)  Transportation Needs: No Transportation Needs (08/10/2022)  Utilities: Not At Risk (08/10/2022)  Tobacco Use: Low Risk  (08/09/2022)    Readmission Risk Interventions     No data to display

## 2022-08-12 ENCOUNTER — Inpatient Hospital Stay (HOSPITAL_COMMUNITY): Payer: Medicare Other

## 2022-08-12 DIAGNOSIS — M438X9 Other specified deforming dorsopathies, site unspecified: Secondary | ICD-10-CM

## 2022-08-12 LAB — BASIC METABOLIC PANEL
Anion gap: 11 (ref 5–15)
BUN: 11 mg/dL (ref 8–23)
CO2: 27 mmol/L (ref 22–32)
Calcium: 9 mg/dL (ref 8.9–10.3)
Chloride: 100 mmol/L (ref 98–111)
Creatinine, Ser: 0.7 mg/dL (ref 0.44–1.00)
GFR, Estimated: >60 mL/min (ref >60)
Glucose, Bld: 126 mg/dL — ABNORMAL HIGH (ref 70–99)
Potassium: 3.6 mmol/L (ref 3.5–5.1)
Sodium: 138 mmol/L (ref 135–145)

## 2022-08-12 LAB — CBC
HCT: 31.1 % — ABNORMAL LOW (ref 36.0–46.0)
Hemoglobin: 9.8 g/dL — ABNORMAL LOW (ref 12.0–15.0)
MCH: 31.2 pg (ref 26.0–34.0)
MCHC: 31.5 g/dL (ref 30.0–36.0)
MCV: 99 fL (ref 80.0–100.0)
Platelets: 160 10*3/uL (ref 150–400)
RBC: 3.14 MIL/uL — ABNORMAL LOW (ref 3.87–5.11)
RDW: 13.9 % (ref 11.5–15.5)
WBC: 7.6 10*3/uL (ref 4.0–10.5)
nRBC: 0 % (ref 0.0–0.2)

## 2022-08-12 MED ORDER — MAGNESIUM CITRATE PO SOLN
1.0000 | Freq: Once | ORAL | Status: DC
  Filled 2022-08-12: qty 296

## 2022-08-12 MED ORDER — AMPHETAMINE-DEXTROAMPHETAMINE 10 MG PO TABS
10.0000 mg | ORAL_TABLET | Freq: Every day | ORAL | Status: DC
  Administered 2022-08-13: 10 mg via ORAL
  Filled 2022-08-12: qty 1

## 2022-08-12 MED ORDER — MAGNESIUM HYDROXIDE 400 MG/5ML PO SUSP
15.0000 mL | Freq: Once | ORAL | Status: AC
  Administered 2022-08-12: 15 mL via ORAL
  Filled 2022-08-12: qty 30

## 2022-08-12 NOTE — Progress Notes (Signed)
Physical Therapy Treatment Patient Details Name: Laura Solis MRN: [id number] DOB: 11-22-1950 Today's Date: 08/12/2022   History of Present Illness Pt is a 72yo female admitted with spinal stenosis  for a T10-pelvis PSIF, L2,3 Xlif, T12-L3 laminectomy.  PMH: previous back surgeries, rotator cuff issues BLY, B knee replacement, fibromyalgia, anxiety.    PT Comments    Patient progressing well towards PT goals. More alert and awake today. Session focused on functional mobility and progressive ambulation. Improved ambulation distance with min guard assist and use of RW for support. Continues to require assist for bed mobility and standing transfers. Pt eager to have a BM. Verbally reviewed back precautions. Cognition is improving and seems closer to baseline today. Will continue to follow to hopefully progress pt to return home. Will have mobility team see her over the weekend.     Recommendations for follow up therapy are one component of a multi-disciplinary discharge planning process, led by the attending physician.  Recommendations may be updated based on patient status, additional functional criteria and insurance authorization.  Follow Up Recommendations  Home health PT     Assistance Recommended at Discharge Intermittent Supervision/Assistance  Patient can return home with the following A little help with walking and/or transfers;A little help with bathing/dressing/bathroom;Assistance with cooking/housework;Assist for transportation;Help with stairs or ramp for entrance   Equipment Recommendations  None recommended by PT    Recommendations for Other Services       Precautions / Restrictions Precautions Precautions: Back Precaution Booklet Issued: No Precaution Comments: verbally reviewed back precautions Restrictions Weight Bearing Restrictions: No     Mobility  Bed Mobility Overal bed mobility: Needs Assistance Bed Mobility: Rolling, Sidelying to Sit Rolling: Min  assist Sidelying to sit: Mod assist       General bed mobility comments: Cues for log roll technique, assist with rolling and trunk elevation to get to EOB with increased time. HOB mostly flat.    Transfers Overall transfer level: Needs assistance Equipment used: Rolling walker (2 wheels) Transfers: Sit to/from Stand Sit to Stand: Mod assist           General transfer comment: mod A to power to standing with cues for hand placement/technique, slow to rise. Transferred to chair post ambulation.    Ambulation/Gait Ambulation/Gait assistance: Min guard Gait Distance (Feet): 80 Feet Assistive device: Rolling walker (2 wheels) Gait Pattern/deviations: Step-to pattern, Step-through pattern, Narrow base of support, Decreased step length - right, Decreased step length - left Gait velocity: decreased Gait velocity interpretation: <1.31 ft/sec, indicative of household ambulator   General Gait Details: Slow, mostly steady gait with Min guard for safety. narrow BoS but improved from yesterday, good sequencing with RW, needs some cues for turns.   Stairs             Wheelchair Mobility    Modified Rankin (Stroke Patients Only)       Balance Overall balance assessment: Needs assistance Sitting-balance support: Feet supported, No upper extremity supported Sitting balance-Leahy Scale: Fair Sitting balance - Comments: Assist to donn shoes sitting EOB   Standing balance support: During functional activity, Bilateral upper extremity supported Standing balance-Leahy Scale: Poor Standing balance comment: Relies on UE support, min guard                            Cognition Arousal/Alertness: Awake/alert Behavior During Therapy: WFL for tasks assessed/performed Overall Cognitive Status: Within Functional Limits for tasks assessed  General Comments: seems apprpropriate today, following commands well. Alert and oriented,  does not recall therapy coming in yesterday. Eager to have a BM.        Exercises      General Comments General comments (skin integrity, edema, etc.): VSS on RA      Pertinent Vitals/Pain Pain Assessment Pain Assessment: 0-10 Pain Score: 6  Pain Location: back Pain Descriptors / Indicators: Aching, Sore Pain Intervention(s): Monitored during session, Repositioned    Home Living                          Prior Function            PT Goals (current goals can now be found in the care plan section) Progress towards PT goals: Progressing toward goals    Frequency    Min 5X/week      PT Plan Current plan remains appropriate    Co-evaluation              AM-PAC PT "6 Clicks" Mobility   Outcome Measure  Help needed turning from your back to your side while in a flat bed without using bedrails?: A Little Help needed moving from lying on your back to sitting on the side of a flat bed without using bedrails?: A Lot Help needed moving to and from a bed to a chair (including a wheelchair)?: A Lot Help needed standing up from a chair using your arms (e.g., wheelchair or bedside chair)?: A Lot Help needed to walk in hospital room?: A Little Help needed climbing 3-5 steps with a railing? : A Lot 6 Click Score: 14    End of Session Equipment Utilized During Treatment: Gait belt Activity Tolerance: Patient tolerated treatment well Patient left: in chair;with call bell/phone within reach;with chair alarm set Nurse Communication: Mobility status PT Visit Diagnosis: Other abnormalities of gait and mobility (R26.89);Pain;Muscle weakness (generalized) (M62.81) Pain - part of body:  (back)     Time: 0940-1000 PT Time Calculation (min) (ACUTE ONLY): 20 min  Charges:  $Gait Training: 8-22 mins                     Marisa Severin, PT, DPT Acute Rehabilitation Services Secure chat preferred Office [place] 08/12/2022, 12:09 PM

## 2022-08-12 NOTE — Care Management Important Message (Signed)
Important Message  Patient Details  Name: Laura Solis MRN: [id number] Date of Birth: 06-Jul-1951   Medicare Important Message Given:  Yes     Orbie Pyo 08/12/2022, 2:34 PM

## 2022-08-12 NOTE — Progress Notes (Signed)
Occupational Therapy Treatment Patient Details Name: Laura Solis MRN: [id number] DOB: 24-Aug-1950 Today's Date: 08/12/2022   History of present illness Pt is a 72yo female admitted with spinal stenosis  for a T10-pelvis PSIF, L2,3 Xlif, T12-L3 laminectomy.  PMH: previous back surgeries, rotator cuff issues BLY, B knee replacement, fibromyalgia, anxiety.   OT comments  Pt making good progress toward all adls. Pt continues to be most limited by pain but is ambulating in room for adls and to bathroom. Recommend always putting 3:1 over commode for pt to toilet with increased independence.  Pt grooming at sink and mobilizing with more independence. Need to address LE dressing and assess if pt requires adaptive equipment. If pt dc's over the weekend pt will need someone with her 24/7 to start.     Recommendations for follow up therapy are one component of a multi-disciplinary discharge planning process, led by the attending physician.  Recommendations may be updated based on patient status, additional functional criteria and insurance authorization.    Follow Up Recommendations  Home health OT     Assistance Recommended at Discharge Frequent or constant Supervision/Assistance  Patient can return home with the following  A little help with walking and/or transfers;A lot of help with bathing/dressing/bathroom;Assist for transportation;Help with stairs or ramp for entrance;Assistance with cooking/housework   Equipment Recommendations  None recommended by OT    Recommendations for Other Services      Precautions / Restrictions Precautions Precautions: Back Precaution Booklet Issued: No Precaution Comments: verbally reviewed back precautions Restrictions Weight Bearing Restrictions: No       Mobility Bed Mobility Overal bed mobility: Needs Assistance Bed Mobility: Rolling, Sidelying to Sit, Sit to Supine Rolling: Min assist Sidelying to sit: Mod assist   Sit to supine: Mod assist Sit  to sidelying: Mod assist, HOB elevated General bed mobility comments: Cues for log roll technique, assist with rolling and trunk elevation to get to EOB with increased time. HOB mostly flat.    Transfers Overall transfer level: Needs assistance Equipment used: Rolling walker (2 wheels) Transfers: Sit to/from Stand Sit to Stand: Min guard, From elevated surface           General transfer comment: From raised bed and commode pt transfers with min guard to supervision. From lower recliner pt required min assist to stand.     Balance Overall balance assessment: Needs assistance Sitting-balance support: Feet supported, No upper extremity supported Sitting balance-Leahy Scale: Good     Standing balance support: During functional activity, Bilateral upper extremity supported Standing balance-Leahy Scale: Poor Standing balance comment: Relies on UE support, min guard                           ADL either performed or assessed with clinical judgement   ADL Overall ADL's : Needs assistance/impaired Eating/Feeding: Independent;Sitting   Grooming: Wash/dry hands;Wash/dry face;Oral care;Supervision/safety;Standing Grooming Details (indicate cue type and reason): pt groomed at sink for 5 minutes using cup to spit into to avoid bending. Pt moves very slowly and deliberately but no LOB noted.                 Toilet Transfer: Supervision/safety;Ambulation;Rolling walker (2 wheels);BSC/3in1;Comfort height toilet;Grab bars Toilet Transfer Details (indicate cue type and reason): Pt walked to bathroom with supervision and toileted with cues to clean self without assist. Pt required no hands on assist. Rec pt always have 3:1 over the commode. Toileting- Clothing Manipulation and Hygiene: Supervision/safety;Sit to/from  stand;Cueing for compensatory techniques;Cueing for back precautions Toileting - Clothing Manipulation Details (indicate cue type and reason): Pt cleaned self and  managed clothing with cues only.     Functional mobility during ADLs: Supervision/safety;Rolling walker (2 wheels) General ADL Comments: Pt moves very slowly but is doing more for herself.  Need to address LE dressing next session before pt goes home. May need AE.    Extremity/Trunk Assessment Upper Extremity Assessment Upper Extremity Assessment: Overall WFL for tasks assessed RUE Deficits / Details: WFL except R shoulder pain (rotator cuff) 4/5  L worse than R. LUE Deficits / Details: WFL except shoulder 3+/5 due to rotator cuff issues.  L worse than R            Vision   Vision Assessment?: No apparent visual deficits   Perception     Praxis      Cognition Arousal/Alertness: Awake/alert Behavior During Therapy: WFL for tasks assessed/performed Overall Cognitive Status: Within Functional Limits for tasks assessed                                 General Comments: Pt appears much better cognitively today.  Pt could state back precautions but can't always follow them when it comes to twisting when getting back into bed.        Exercises      Shoulder Instructions       General Comments Pt making good progress with adls and functional trasnfers. Need to address LE dressing.    Pertinent Vitals/ Pain       Pain Assessment Pain Assessment: 0-10 Pain Score: 7  Pain Location: back Pain Descriptors / Indicators: Aching, Sore Pain Intervention(s): Limited activity within patient's tolerance, Monitored during session, Repositioned  Home Living                                          Prior Functioning/Environment              Frequency  Min 2X/week        Progress Toward Goals  OT Goals(current goals can now be found in the care plan section)  Progress towards OT goals: Progressing toward goals  Acute Rehab OT Goals Patient Stated Goal: to have less pain OT Goal Formulation: With patient/family Time For Goal  Achievement: 08/24/22 Potential to Achieve Goals: Good ADL Goals Pt Will Perform Grooming: with supervision;standing Pt Will Perform Lower Body Bathing: with supervision;sit to/from stand;with adaptive equipment Pt Will Perform Lower Body Dressing: with supervision;with adaptive equipment;sit to/from stand Pt Will Transfer to Toilet: with supervision;ambulating Pt Will Perform Toileting - Clothing Manipulation and hygiene: with supervision;with adaptive equipment Pt Will Perform Tub/Shower Transfer: Shower transfer;with supervision;ambulating;shower seat  Plan Discharge plan remains appropriate    Co-evaluation                 AM-PAC OT "6 Clicks" Daily Activity     Outcome Measure   Help from another person eating meals?: None Help from another person taking care of personal grooming?: None Help from another person toileting, which includes using toliet, bedpan, or urinal?: A Little Help from another person bathing (including washing, rinsing, drying)?: A Lot Help from another person to put on and taking off regular upper body clothing?: A Little Help from another person to put on and taking off  regular lower body clothing?: A Lot 6 Click Score: 18    End of Session Equipment Utilized During Treatment: Rolling walker (2 wheels)  OT Visit Diagnosis: Unsteadiness on feet (R26.81)   Activity Tolerance Patient tolerated treatment well   Patient Left in bed;with call bell/phone within reach;with bed alarm set   Nurse Communication Mobility status        Time: 5102-5852 OT Time Calculation (min): 39 min  Charges: OT General Charges $OT Visit: 1 Visit OT Treatments $Self Care/Home Management : 38-52 mins   Glenford Peers 08/12/2022, 12:54 PM

## 2022-08-12 NOTE — Progress Notes (Signed)
Orthopedic Surgery Post-operative Progress Note  Assessment: Patient is a 72 y.o. female who is currently admitted after undergoing T10-pelvis PSIF, L2/3 XLIF, T12-L3 laminectomies   Plan: -Operative plans complete -Out of bed as tolerated, no brace -No bending/lifting/twisting greater than 10 pounds -PT/OT evaluate and treat -Pain control -Regular diet -Encouraged incentive spirometry -Will start lovenox tomorrow for dvt ppx -Ancef x2 post-operative doses -Anticipate discharge to home 1/14  Hypocalcemia -Ca was 9.0 this morning, will discontinue oral supplementation  AKI -creatinine was 1.16, now 0.70 and has returned to baseline   Acute anemia from surgical blood loss -Hgb was 9.8 this morning, will continue to monitor for symptoms/signs of anemia but will stop labs  Fibromyalgia -restarted home prozac. Discontinue gabapentin as she has been sedated due to polypharmacy  Anxiety -restarted home prozac. Discontinue xanax as she has been sedated due to polypharmacy  Irritable bowel syndrome -started her on standard bowel regime with miralax and docusate -adding milk of magnesia this morning  ___________________________________________________________________________   Subjective: No acute events since seen this morning. Walked the halls and around the room with her walker. Still have a good amount of back pain. Had no leg pain when walking. Denies paresthesias and numbness.   Objective:  General: no acute distress, laying upright in bed Neurologic: alert, answering questions appropriately, following commands Respiratory: unlabored breathing on room air Skin: dressing clear/dry/intact  MSK (spine):  -Strength exam      Right  Left  EHL    4/5  5/5 TA    5/5  5/5 GSC    5/5  5/5 Knee extension  5/5  5/5 Hip flexion   4/5  4/5  -Sensory exam    Sensation intact to light touch in L3-S1 nerve distributions of bilateral lower extremities   Patient name: Laura Solis Patient MRN: [id number] Date: 08/12/22

## 2022-08-12 NOTE — Discharge Instructions (Signed)
Orthopedic Surgery Discharge Instructions  Patient name: Laura Solis Procedure Performed: L2/3 lateral interbody fusion, T12-L3 laminectomies, T10-pelvis posterior instrumented spinal fusion Date of Surgery: 08/09/2022 Surgeon: Ileene Rubens, MD  Pre-operative Diagnosis: lumbar stenosis with neurogenic claudication, loss of lumbar lordosis Post-operative Diagnosis: same as above  Discharge Date: 08/14/2022 Discharged to: home Discharge Condition: stable  Activity: You should refrain from bending, lifting, or twisting with objects greater than ten pounds until three months after surgery. You are encouraged to walk as much as desired. You can perform household activities such as cleaning dishes, doing laundry, vacuuming, etc. as long as the ten-pound restriction is followed. You do not need to wear a brace during the post-operative period.   Incision Care: Your incision site has a dressing over it. That dressing should remain in place and dry at all times for a total of one week after surgery. After one week, you can remove the dressing. Underneath the dressing, you will find pieces of tape. You should leave these pieces of tape in place. They will fall off with time. Do not pick, rub, or scrub at them. Do not put cream or lotion over the surgical area. After one week and once the dressing is off, it is okay to let soap and water run over your incision. Again, do not pick, scrub, or rub at the pieces of tape when bathing. Do not submerge (e.g., take a bath, swim, go in a hot tub, etc.) until six weeks after surgery. There may be some bloody drainage from the incision into the dressing after surgery. This is normal. You do not need to replace the dressing. Continue to leave it in place for the one week as instructed above. Should the dressing become saturated with blood or drainage, please call the office for further instructions.   Medications: You have been prescribed oxycodone. This is a narcotic  pain medication and should only be taken as prescribed. You should not drink alcohol or operate heavy machinery (including driving) while taking this medication. The oxycodone can cause constipation as a side effect. For that reason, you have been prescribed senna and miralax. These are both laxatives. You do not need to take this medication if you develop diarrhea. Should you remain constipated even while taking these medications, please increase the dose of miralax to twice daily. Tylenol has been prescribed to be taken every 8 hours, which will give you additional pain relief.  Do not take NSAIDs (ibuprofen, Aleve, Celebrex, naproxen, meloxicam, etc.) for the first 6 weeks after surgery as there is some evidence that their use may decrease the chances of successful fusion.   In order to set expectations for opioid prescriptions, you will only be prescribed opioids for a total of six weeks after surgery and, at two-weeks after surgery, your opioid prescription will start to tapered (decreased dosage and number of pills). If you have ongoing need for opioid medication six weeks after surgery, you will be referred to pain management. If you are already established with a provider that is giving you opioid medications, you should schedule an appointment with them for six weeks after surgery if you feel you are going to need another prescription. State law only allows for opioid prescriptions one week at a time. If you are running out of opioid medication near the end of the week, please call the office during business hours before running out so I can send you another prescription.   You may resume any home blood thinners (warfarin,  lovenox, apixaban, plavix, xarelto, etc) 72 hours after your surgery. Take these medications as they were previously prescribed.  Driving: You should not drive while taking narcotic pain medications. You should start getting back to driving slowly and you may want to try driving  in a parking lot before doing anything more.   Diet: You are safe to resume your regular diet after surgery.   Reasons to Call the Office After Surgery: You should feel free to call the office with any concerns or questions you have in the post-operative period, but you should definitely notify the office if you develop: -shortness of breath, chest pain, or trouble breathing -excessive bleeding, drainage, redness, or swelling around the surgical site -fevers, chills, or pain that is getting worse with each passing day -persistent nausea or vomiting -new weakness in either leg -new or worsening numbness or tingling in either leg -numbness in the groin, bowel or bladder incontinence -other concerns about your surgery  Follow Up Appointments: You should have an office appointment scheduled for approximately two weeks after surgery. If you do not remember when this appointment is or do not already have it scheduled, please call the office to schedule.   Office Information:  -Ileene Rubens, MD -Phone number: [phone] -Address: [address]       [place], [place] [address]

## 2022-08-12 NOTE — Progress Notes (Signed)
Orthopedic Surgery Post-operative Progress Note  Assessment: Patient is a 72 y.o. female who is currently admitted after undergoing T10-pelvis PSIF, L2/3 XLIF, T12-L3 laminectomies   Plan: -Operative plans complete -Needs upright films when able -Drains removed this morning -Out of bed as tolerated, no brace -No bending/lifting/twisting greater than 10 pounds -PT/OT evaluate and treat -Pain control -Regular diet -Encouraged incentive spirometry -Will start lovenox tomorrow for dvt ppx -Ancef x2 post-operative doses -Disposition: remain floor status  Hypocalcemia -Ca was 9.0 this morning, will discontinue oral supplementation  AKI -creatinine was 1.16, now 0.70 and has returned to baseline   Acute anemia from surgical blood loss -Hgb was 9.8 this morning, will continue to monitor for symptoms/signs of anemia but will stop labs  Fibromyalgia -restarted home prozac. Discontinue gabapentin as she has been sedated due to polypharmacy  Anxiety -restarted home prozac. Discontinue xanax as she has been sedated due to polypharmacy  Irritable bowel syndrome -started her on standard bowel regime with miralax and docusate -adding milk of magnesia this morning  ___________________________________________________________________________   Subjective: No acute events overnight. Ambulated around the room last night. Pain is slowly improving in her back. No leg pain - feels significant improvement in her legs. Denies paresthesias and numbness. Has not had a BM yet, but is passing flatus.   Objective:  General: no acute distress, up in chair this morning Neurologic: alert, answering questions appropriately, following commands Respiratory: unlabored breathing on room air Skin: dressing clear/dry/intact, drains with minimal serosanguinous output  MSK (spine):  -Strength exam      Right  Left  EHL    4/5  5/5 TA    5/5  5/5 GSC    5/5  5/5 Knee extension  5/5  5/5 Hip  flexion   4/5  4/5  -Sensory exam    Sensation intact to light touch in L3-S1 nerve distributions of bilateral lower extremities   Patient name: Laura Solis Patient MRN: [id number] Date: 08/12/22

## 2022-08-12 NOTE — Discharge Summary (Signed)
Orthopedic Surgery Discharge Summary  Patient name: Laura Solis Patient MRN: [id number] Date: 08/14/2022  Attending physician: Ileene Rubens, MD Final diagnosis: lumbar stenosis with neurogenic claudication, loss of lumbar lordosis Findings: T12-L3 enlarged and arthritic facets, hypertrophic ligamentum at those levels as well   Hospital course: Patient is a 72 y.o. female who was admitted after undergoing T10-pelvis posterior instrumented spinal fusion, L2/3 XLIF, and T12-L3 laminectomies. The patient was sedated and confused after surgery which was a combination of recovering from a long duration under anesthesia and polypharmacy. With some time and discontinuation of medications, she returned to her baseline mental status. She had significant pain immediately after surgery in her back, but pain eventually was controlled with a multimodal regimen including oxycodone. Her pre-operative pain had been relieved. Labs during the hospitalization revealed hypocalcemia which was corrected with oral supplementation. She also had an AKI with a creatinine of 1.16 from her baseline of 0.79. This AKI was correct with IV fluid and returned to her baseline. She also had acute blood loss anemia from surgery. Her hemoglobin stabilized with no intervention at 9.1. The patient worked with physical therapy who recommended discharge to home. The patient was tolerating an oral diet without issue and was voiding spontaneously after surgery. The patient's vitals were stable on the day of discharge. The patient's drains were removed on post-operative day 3. The patient was medically ready for discharge and was discharge to home on post-operative day 5.  Instructions:   Orthopedic Surgery Discharge Instructions  Patient name: Laura Solis Procedure Performed: L2/3 lateral interbody fusion, T12-L3 laminectomies, T10-pelvis posterior instrumented spinal fusion Date of Surgery: 08/09/2022 Surgeon: Ileene Rubens,  MD  Pre-operative Diagnosis: lumbar stenosis with neurogenic claudication, loss of lumbar lordosis Post-operative Diagnosis: same as above  Discharge Date: 08/14/2022 Discharged to: home Discharge Condition: stable  Activity: You should refrain from bending, lifting, or twisting with objects greater than ten pounds until three months after surgery. You are encouraged to walk as much as desired. You can perform household activities such as cleaning dishes, doing laundry, vacuuming, etc. as long as the ten-pound restriction is followed. You do not need to wear a brace during the post-operative period.   Incision Care: Your incision site has a dressing over it. That dressing should remain in place and dry at all times for a total of one week after surgery. After one week, you can remove the dressing. Underneath the dressing, you will find pieces of tape. You should leave these pieces of tape in place. They will fall off with time. Do not pick, rub, or scrub at them. Do not put cream or lotion over the surgical area. After one week and once the dressing is off, it is okay to let soap and water run over your incision. Again, do not pick, scrub, or rub at the pieces of tape when bathing. Do not submerge (e.g., take a bath, swim, go in a hot tub, etc.) until six weeks after surgery. There may be some bloody drainage from the incision into the dressing after surgery. This is normal. You do not need to replace the dressing. Continue to leave it in place for the one week as instructed above. Should the dressing become saturated with blood or drainage, please call the office for further instructions.   Medications: You have been prescribed oxycodone. This is a narcotic pain medication and should only be taken as prescribed. You should not drink alcohol or operate heavy machinery (including driving) while taking this  medication. The oxycodone can cause constipation as a side effect. For that reason, you have been  prescribed senna and miralax. These are both laxatives. You do not need to take this medication if you develop diarrhea. Should you remain constipated even while taking these medications, please increase the dose of miralax to twice daily. Tylenol has been prescribed to be taken every 8 hours, which will give you additional pain relief.  Do not take NSAIDs (ibuprofen, Aleve, Celebrex, naproxen, meloxicam, etc.) for the first 6 weeks after surgery as there is some evidence that their use may decrease the chances of successful fusion.   In order to set expectations for opioid prescriptions, you will only be prescribed opioids for a total of six weeks after surgery and, at two-weeks after surgery, your opioid prescription will start to tapered (decreased dosage and number of pills). If you have ongoing need for opioid medication six weeks after surgery, you will be referred to pain management. If you are already established with a provider that is giving you opioid medications, you should schedule an appointment with them for six weeks after surgery if you feel you are going to need another prescription. State law only allows for opioid prescriptions one week at a time. If you are running out of opioid medication near the end of the week, please call the office during business hours before running out so I can send you another prescription.   You may resume any home blood thinners (warfarin, lovenox, apixaban, plavix, xarelto, etc) 72 hours after your surgery. Take these medications as they were previously prescribed.  Driving: You should not drive while taking narcotic pain medications. You should start getting back to driving slowly and you may want to try driving in a parking lot before doing anything more.   Diet: You are safe to resume your regular diet after surgery.   Reasons to Call the Office After Surgery: You should feel free to call the office with any concerns or questions you have in the  post-operative period, but you should definitely notify the office if you develop: -shortness of breath, chest pain, or trouble breathing -excessive bleeding, drainage, redness, or swelling around the surgical site -fevers, chills, or pain that is getting worse with each passing day -persistent nausea or vomiting -new weakness in either leg -new or worsening numbness or tingling in either leg -numbness in the groin, bowel or bladder incontinence -other concerns about your surgery  Follow Up Appointments: You should have an office appointment scheduled for approximately two weeks after surgery. If you do not remember when this appointment is or do not already have it scheduled, please call the office to schedule.   Office Information:  -Ileene Rubens, MD -Phone number: [phone] -Address: [address]       [place], [place] [address]

## 2022-08-13 ENCOUNTER — Encounter (HOSPITAL_COMMUNITY): Payer: Self-pay | Admitting: Orthopedic Surgery

## 2022-08-13 MED ORDER — SENNA 8.6 MG PO TABS: 1.0000 | ORAL_TABLET | Freq: Two times a day (BID) | ORAL | 0 refills | Status: AC

## 2022-08-13 MED ORDER — ACETAMINOPHEN 500 MG PO TABS: 1000.0000 mg | ORAL_TABLET | Freq: Three times a day (TID) | ORAL | 0 refills | Status: AC

## 2022-08-13 MED ORDER — OXYCODONE HCL 5 MG PO TABS: 2.5000 mg | ORAL_TABLET | ORAL | 0 refills | Status: AC | PRN

## 2022-08-13 MED ORDER — POLYETHYLENE GLYCOL 3350 17 G PO PACK: 17.0000 g | PACK | Freq: Every day | ORAL | 0 refills | Status: AC

## 2022-08-13 NOTE — Progress Notes (Signed)
CVC removed using sterile techniques Vaseline gauze 4x4 and Tegaderm applied pressure held for 5 minutes pt tolerated well no s/s of bleeding  discharging pt to home with personal belongings ( clothes and mobile phone ) with daughter

## 2022-08-13 NOTE — Plan of Care (Signed)
  Problem: Education: Goal: Knowledge of General Education information will improve Description: Including pain rating scale, medication(s)/side effects and non-pharmacologic comfort measures Outcome: Progressing

## 2022-08-13 NOTE — Progress Notes (Signed)
Surgical dressing down spine intact pt understands wound caqre at home and not to submerge herself into water untiul MD releases her

## 2022-08-13 NOTE — Progress Notes (Signed)
Orthopedic Surgery Post-operative Progress Note  Assessment: Patient is a 72 y.o. female who is currently admitted after undergoing T10-pelvis PSIF, L2/3 XLIF, T12-L3 laminectomies   Plan: -Operative plans complete -Out of bed as tolerated, no brace -No bending/lifting/twisting greater than 10 pounds -PT/OT evaluate and treat -Pain control -Regular diet -Encouraged incentive spirometry -Ancef x2 post-operative doses -Anticipate discharge to home this afternoon  Hypocalcemia -resolved  AKI -resolved  Acute anemia from surgical blood loss -Hgb was 9.8 this morning, will continue to monitor for symptoms/signs of anemia but will stop labs  Fibromyalgia -restarted home prozac. Discontinue gabapentin as she had been sedated due to polypharmacy  Anxiety -restarted home prozac. Discontinue xanax as she had been sedated due to polypharmacy  Irritable bowel syndrome -has had a bowel movement, will continue with current regimen  ___________________________________________________________________________   Subjective: No acute events overnight. Had two bowel movements yesterday. Has already ambulated around the room this morning. Feels ready to leave and wants to go home. Pain adequately controlled in her back. Not having any leg pain. Denies paresthesias and numbness.   Objective:  General: no acute distress, laying in bed Neurologic: alert, answering questions appropriately, following commands Respiratory: unlabored breathing on room air Skin: dressing clear/dry/intact  MSK (spine):  -Strength exam      Right  Left  EHL    4/5  5/5 TA    5/5  5/5 GSC    5/5  5/5 Knee extension  5/5  5/5 Hip flexion   4/5  4/5  -Sensory exam    Sensation intact to light touch in L3-S1 nerve distributions of bilateral lower extremities   Patient name: Laura Solis Patient MRN: [id number] Date: 08/13/22

## 2022-08-13 NOTE — Progress Notes (Signed)
Physical Therapy Treatment Patient Details Name: Laura Solis MRN: [id number] DOB: Oct 08, 1950 Today's Date: 08/13/2022   History of Present Illness Pt is a 72yo female admitted with spinal stenosis  for a T10-pelvis PSIF, L2,3 Xlif, T12-L3 laminectomy.  PMH: previous back surgeries, rotator cuff issues BLY, B knee replacement, fibromyalgia, anxiety.    PT Comments    Pt progressing well toward goals, but not quite independent overall.  Emphasis on family/pt education, safety with transitions/technique, sit to stands, progression of gait stability/safety and safe negotiation of stairs.    Recommendations for follow up therapy are one component of a multi-disciplinary discharge planning process, led by the attending physician.  Recommendations may be updated based on patient status, additional functional criteria and insurance authorization.  Follow Up Recommendations  Home health PT     Assistance Recommended at Discharge Intermittent Supervision/Assistance  Patient can return home with the following A little help with walking and/or transfers;A little help with bathing/dressing/bathroom;Assistance with cooking/housework;Assist for transportation;Help with stairs or ramp for entrance   Equipment Recommendations  None recommended by PT    Recommendations for Other Services       Precautions / Restrictions Precautions Precautions: Back     Mobility  Bed Mobility Overal bed mobility: Needs Assistance   Rolling: Min guard, Min assist (min if no rail) Sidelying to sit: Min guard       General bed mobility comments: Completed/reinforced in all back education with pt /family    Transfers Overall transfer level: Needs assistance Equipment used: Rolling walker (2 wheels) Transfers: Sit to/from Stand Sit to Stand: Min guard           General transfer comment: cues for hand placement    Ambulation/Gait Ambulation/Gait assistance: Min guard Gait Distance (Feet): 200  Feet Assistive device: Rolling walker (2 wheels) Gait Pattern/deviations: Step-through pattern Gait velocity: decreased Gait velocity interpretation: <1.8 ft/sec, indicate of risk for recurrent falls   General Gait Details: slower, R LE mildly weak, but generally steady.   Stairs Stairs: Yes Stairs assistance: Min guard Stair Management: One rail Left, Two rails, Step to pattern, Forwards Number of Stairs: 5 General stair comments: generally safe with 1 rail, but home rail is on the more difficult side for her weakness.  Family to assist initially.   Wheelchair Mobility    Modified Rankin (Stroke Patients Only)       Balance Overall balance assessment: Needs assistance   Sitting balance-Leahy Scale: Good     Standing balance support: During functional activity, Bilateral upper extremity supported Standing balance-Leahy Scale: Poor Standing balance comment: relies on the RW                            Cognition Arousal/Alertness: Awake/alert Behavior During Therapy: WFL for tasks assessed/performed Overall Cognitive Status: Within Functional Limits for tasks assessed                                          Exercises      General Comments        Pertinent Vitals/Pain Pain Assessment Pain Assessment: Faces Faces Pain Scale: Hurts a little bit Pain Location: back Pain Descriptors / Indicators: Aching, Sore Pain Intervention(s): Monitored during session    Home Living  Prior Function            PT Goals (current goals can now be found in the care plan section) Acute Rehab PT Goals Patient Stated Goal: to reduce pain and return to independence PT Goal Formulation: With patient Time For Goal Achievement: 08/24/22 Potential to Achieve Goals: Good Progress towards PT goals: Progressing toward goals    Frequency    Min 5X/week      PT Plan Current plan remains appropriate     Co-evaluation              AM-PAC PT "6 Clicks" Mobility   Outcome Measure  Help needed turning from your back to your side while in a flat bed without using bedrails?: A Little Help needed moving from lying on your back to sitting on the side of a flat bed without using bedrails?: A Little Help needed moving to and from a bed to a chair (including a wheelchair)?: A Little Help needed standing up from a chair using your arms (e.g., wheelchair or bedside chair)?: A Little Help needed to walk in hospital room?: A Little Help needed climbing 3-5 steps with a railing? : A Little 6 Click Score: 18    End of Session   Activity Tolerance: Patient tolerated treatment well Patient left: in bed;with call bell/phone within reach;with family/visitor present Nurse Communication: Mobility status PT Visit Diagnosis: Other abnormalities of gait and mobility (R26.89);Pain;Muscle weakness (generalized) (M62.81) Pain - part of body:  (back)     Time: 4128-7867 PT Time Calculation (min) (ACUTE ONLY): 30 min  Charges:  $Gait Training: 8-22 mins $Therapeutic Activity: 8-22 mins                     08/13/2022  Ginger Carne., PT Acute Rehabilitation Services [phone]  (office)   Laura Solis 08/13/2022, 2:25 PM

## 2022-08-13 NOTE — TOC Transition Note (Signed)
Transition of Care [facility]) - CM/SW Discharge Note   Patient Details  Name: Laura Solis MRN: [id number] Date of Birth: 12-14-1950  [facility] CM/SW Contact:  Carles Collet, RN Phone Number: 08/13/2022, 8:26 AM   Clinical Narrative:     Spoke w patient and she declines RW and 3/1, stating she has them at hoe. Spoke w answering service for [place] to no tify them that patient will DC today. No other TOC needs identified.   Final next level of care: Home w Home Health Services Barriers to Discharge: No Barriers Identified   Patient Goals and CMS Choice CMS Medicare.gov Compare Post Acute Care list provided to:: Patient Choice offered to / list presented to : Patient  Discharge Placement                         Discharge Plan and Services Additional resources added to the After Visit Summary for     Discharge Planning Services: CM Consult Post Acute Care Choice: Home Health          DME Arranged: N/A         HH Arranged: PT, OT [place] Agency: [place] Date [place]: 08/13/22 Time HH Agency Contacted: [id number] Representative spoke with at [place]: Thurmond Butts- answering service  Social Determinants of Health ([place]) Interventions [place]: No Food Insecurity (08/10/2022)  Housing: Low Risk  (08/10/2022)  Transportation Needs: No Transportation Needs (08/10/2022)  Utilities: Not At Risk (08/10/2022)  Tobacco Use: Low Risk  (08/13/2022)     Readmission Risk Interventions     No data to display

## 2022-08-15 ENCOUNTER — Telehealth: Payer: Self-pay | Admitting: Orthopedic Surgery

## 2022-08-15 NOTE — Telephone Encounter (Signed)
I called and she was mostly concerned about taking the dressing off-I advised that this was fine to do.

## 2022-08-15 NOTE — Telephone Encounter (Signed)
Patient requested a call from nurse about Post op aftercare want to know exactly what she needs to do about the device in her neck. Patient would like a call, please advise

## 2022-08-24 ENCOUNTER — Ambulatory Visit (INDEPENDENT_AMBULATORY_CARE_PROVIDER_SITE_OTHER): Payer: Medicare Other | Admitting: Orthopedic Surgery

## 2022-08-24 ENCOUNTER — Ambulatory Visit (INDEPENDENT_AMBULATORY_CARE_PROVIDER_SITE_OTHER): Payer: Medicare Other

## 2022-08-24 DIAGNOSIS — Z981 Arthrodesis status: Secondary | ICD-10-CM | POA: Diagnosis not present

## 2022-08-24 MED FILL — Sodium Chloride Irrigation Soln 0.9%: Qty: 3000 | Status: AC

## 2022-08-24 MED FILL — Heparin Sodium (Porcine) Inj 1000 Unit/ML: INTRAMUSCULAR | Qty: 30 | Status: AC

## 2022-08-24 MED FILL — Sodium Chloride IV Soln 0.9%: INTRAVENOUS | Qty: 2000 | Status: AC

## 2022-08-24 NOTE — Progress Notes (Signed)
Orthopedic Surgery Office Note  Assessment: Patient is a 72 y.o. female who is here for routine follow up after a T10-pelvis PSIF, L2/3 XLIF, and T12-L3 laminectomies (~2 weeks post-op)   Plan: -Operative plans complete -Out of bed as tolerated, no brace -No bending/lifting/twisting greater than 10 pounds -Okay to let soap/water run over incision, do not submerge -Pain medication: does not need any refills at this time -Follow up in office in 4 weeks, x-rays at next visit: AP/lateral thoracolumbar  ___________________________________________________________________________   Subjective: Patient is at home. Pain has been getting better. She feels she has been making progress on a daily basis. Not having any leg pain. Legs still feel slightly weaker. Still has some back pain around the incision, but her worst pain is a spot in her right lumbar paraspinal muscles. She feels a catching sensation there as well. No similar pain on the left side. Denies paresthesias and numbness. Has not noticed any redness or drainage around her incisions.   Objective:  General: no acute distress, appropriate affect Neurologic: alert, answering questions appropriately, following commands Respiratory: unlabored breathing on room air Skin: incisions over lumbar spine appears well approximated. There is no erythema, induration, or active/expressible drainage. Her left-sided lateral approach incision is well healed. There is no erythema, induration, or expressible drainage. There is still some dermabond in place over the wound  MSK (spine):  -Strength exam      Right  Left  EHL    4/5  4/5 TA    5/5  5/5 GSC    5/5  5/5 Knee extension  5/5  5/5 Hip flexion   5/5  5/5  -Sensory exam    Sensation intact to light touch in L3-S1 nerve distributions of bilateral lower extremities  XR of the lumbar spine taken and reviewed today show screws in similar position to upright films from 08/12/2022. There is no  lucency around the screws. Screws have not backed out. Interbody device at L2/3 is in place without evidence of complication. There is increased lordosis through the L2/3 disc space when compared to pre-operative films. LL of 50, PI of 64.  Patient name: Laura Solis Patient MRN: [id number] Date: 08/24/22

## 2022-08-26 ENCOUNTER — Telehealth: Payer: Self-pay | Admitting: Orthopedic Surgery

## 2022-08-26 NOTE — Telephone Encounter (Signed)
Looks like Gwinda Passe has been doing her forms. I spoke with Manuela Schwartz with [employer] and advised her of this and she is going to call the patient to let her know that we use [employer] to complete the forms.

## 2022-08-26 NOTE — Telephone Encounter (Signed)
Patient called asked for a call back concerning her disability paperwork.  Patient said [employer] advised her to contact [place] for update on paperwork. The number to contact patient is [phone]

## 2022-08-31 ENCOUNTER — Telehealth: Payer: Self-pay | Admitting: Orthopedic Surgery

## 2022-08-31 ENCOUNTER — Encounter: Payer: Self-pay | Admitting: Radiology

## 2022-08-31 NOTE — Telephone Encounter (Signed)
Patient wants Alyse Low to call her. Please advise

## 2022-08-31 NOTE — Telephone Encounter (Signed)
I called patient, she states that she needs her Disability forms done, I advised that she will need to complete the paperwork for [employer] so they can complete her papers. I advised that since she had previously been seen in [place] does those patients forms for there, that here we use [employer]. She asked if I could write and letter for her disability company and her employer that states how long she will be out. I did write that letter and it was faxed to Mayra Neer @ [phone] and [facility] @ [phone]

## 2022-09-26 ENCOUNTER — Ambulatory Visit (INDEPENDENT_AMBULATORY_CARE_PROVIDER_SITE_OTHER): Payer: Medicare Other | Admitting: Orthopedic Surgery

## 2022-09-26 ENCOUNTER — Ambulatory Visit (INDEPENDENT_AMBULATORY_CARE_PROVIDER_SITE_OTHER): Payer: Medicare Other

## 2022-09-26 DIAGNOSIS — Z981 Arthrodesis status: Secondary | ICD-10-CM

## 2022-09-26 NOTE — Progress Notes (Signed)
Orthopedic Surgery Post-operative Office Visit  Procedure: T10-pelvis PSIF, L2/3 XLIF, and T12-L3 laminectomies Date of Surgery: 08/09/2022 (~6 weeks post-op)  Assessment: Patient is a 72 y.o. who is doing well after T10-pelvis PSIF, L2/3 XLIF, and T12-L3 laminectomies   Plan: -Operative plans complete -Out of bed as tolerated, no brace -No bending/lifting/twisting greater than 10 pounds -Okay to submerge the wound at this point -Pain management: OTC medications -Start outpatient PT, referral provided to her -Return to office in 6 weeks, lumbar x-rays needed at next visit: AP/lateral/flex/ex lumbar  ___________________________________________________________________________   Subjective: She is doing better since surgery.  She still has a spot on the right side of her lumbar spine in the area of the paraspinals that still gives her pain for a few days and then goes away.  That pain is tolerable.  She is no longer having any leg pain.  She is able to walk without leg pain.  She has transition from a walker to a cane.  She has no numbness or paresthesias in her legs.  She has not noticed any redness or drainage from her incisions.  Is looking to get back to work.  Objective:  General: no acute distress, appropriate affect Neurologic: alert, answering questions appropriately, following commands Respiratory: unlabored breathing on room air Skin: Her lateral incision and posterior incisions are well-healed without any erythema, induration, active/expressible drainage  MSK (spine):  -Strength exam      Left  Right  EHL    4/5  4/5 TA    5/5  5/5 GSC    5/5  5/5 Knee extension  5/5  5/5 Hip flexion   5/5  5/5  -Sensory exam    Sensation intact to light touch in L3-S1 nerve distributions of bilateral lower extremities  Imaging: X-rays of the lumbar spine taken today were independently reviewed and interpreted, showing posterior instrumentation from T10 to the pelvis.  There is  no lucency around the screws.  Screws are not backed out.  There is an interbody device at L2/3 that appears in appropriate position.  No lucency around the interbody device.  She also has previous interbody devices at L3/4, L4/5.  These have not changed in position since prior films and there is no lucency around those either. Laminectomy defect from T12-L5. No fracture seen.   Patient name: Laura Solis Patient MRN: [id number] Date of visit: 09/26/22

## 2022-09-28 ENCOUNTER — Telehealth: Payer: Self-pay | Admitting: Orthopedic Surgery

## 2022-09-28 NOTE — Telephone Encounter (Signed)
Pt called requesting a call from [place]. Pt states Dr Laurance Flatten gave her a return to work note but need to discuss with Alyse Low her restrictions before they will let her return to work. Please call pt at [phone].

## 2022-09-29 ENCOUNTER — Telehealth: Payer: Self-pay | Admitting: Orthopedic Surgery

## 2022-09-29 ENCOUNTER — Encounter: Payer: Self-pay | Admitting: Radiology

## 2022-09-29 NOTE — Telephone Encounter (Signed)
New work note faxed and patient is aware

## 2022-09-29 NOTE — Telephone Encounter (Signed)
Note mailed and faxed

## 2022-09-29 NOTE — Telephone Encounter (Signed)
Note has been faxed and patient is aware

## 2022-09-29 NOTE — Telephone Encounter (Signed)
Patient called advised her employer want her to remain out of work until her next appointment which is 11/07/2022. Patient said her employer would like a note faxed to them confirming that she  will remain out of work until 11/07/2022.   The fax# to her employer is [phone]    The number to contact patient is [phone]

## 2022-10-13 ENCOUNTER — Encounter: Payer: Self-pay | Admitting: Radiology

## 2022-10-13 ENCOUNTER — Telehealth: Payer: Self-pay | Admitting: Orthopedic Surgery

## 2022-10-13 NOTE — Telephone Encounter (Signed)
Patient called. She would like a note sent to her job stating that she was seen 2/26 and has a fu 4/8 with Dr. Laurance Flatten. The fax number is [phone]

## 2022-10-13 NOTE — Telephone Encounter (Signed)
Note has been written and faxed to patients employer as she requested

## 2022-11-07 ENCOUNTER — Other Ambulatory Visit: Payer: Self-pay

## 2022-11-07 ENCOUNTER — Ambulatory Visit (INDEPENDENT_AMBULATORY_CARE_PROVIDER_SITE_OTHER): Payer: Medicare Other | Admitting: Orthopedic Surgery

## 2022-11-07 ENCOUNTER — Other Ambulatory Visit (INDEPENDENT_AMBULATORY_CARE_PROVIDER_SITE_OTHER): Payer: Medicare Other

## 2022-11-07 DIAGNOSIS — Z981 Arthrodesis status: Secondary | ICD-10-CM

## 2022-11-07 NOTE — Progress Notes (Signed)
Orthopedic Surgery Office Visit   Procedure: T10-pelvis PSIF, L2/3 XLIF, and T12-L3 laminectomies Date of Surgery: 08/09/2022 (~12 weeks post-op)   Assessment: Patient is a 72 y.o. who is doing well after T10-pelvis PSIF, L2/3 XLIF, and T12-L3 laminectomies     Plan: -Operative plans complete -Out of bed as tolerated, no brace -No spine specific restrictions -Provided return to work note -Okay to submerge the wound at this point -Pain management: OTC medications -Explained that some of balance could be attributed to cervical myelopathy for which she had an ACDF with Dr. Ophelia Charter in 04/2022. I discussed the fact that that surgery does not reverse the symptoms but prevents progression -Return to office in 12 weeks, lumbar x-rays needed at next visit: AP/lateral/flex/ex lumbar   ___________________________________________________________________________     Subjective: Leg pain is better than pre-op. Still feels like her back will catch and she will have pain with twisting of her back. Not taking any medication for pain. Feels she has had significant improvement with surgery. Is working with PT and feels like she has been making progress in terms of her mobility. She is only using the cane for long distances. Has gotten back to mowing her lawn and weeding. Feels her balance is slightly off. She has a history of cervical myelopathy and underwent ACDF with Dr. Ophelia Charter for this condition on 04/13/2022. Denies paresthesias and numbness. No saddle anesthesia. No bowel or bladder incontinence.   Objective:   General: no acute distress, appropriate affect Neurologic: alert, answering questions appropriately, following commands Respiratory: unlabored breathing on room air Skin: Her lateral incision and posterior incisions are well-healed without any erythema, induration, active/expressible drainage   MSK (spine):   -Strength exam                                                   Left                   Right   EHL                              4/5                  4/5 TA                                 5/5                  5/5 GSC                             5/5                  5/5 Knee extension            5/5                  5/5 Hip flexion                    5/5                  5/5   -Sensory exam  Sensation intact to light touch in L3-S1 nerve distributions of bilateral lower extremities   Imaging: X-rays of the lumbar spine taken 11/07/2022 were independently reviewed and interpreted, showing T10-pelvis posterior instrumentation with no lucency around the screws. Screws have not backed out. The interbody device at L2/3 is in appropriate position. Laminectomy defect seen from T12-L5. There are previous interbody devices at L3/4 and L4/5 which have not changed in position.      Patient name: Laura Solis Patient MRN: [id number] Date of visit: 11/07/22

## 2022-11-07 NOTE — Progress Notes (Signed)
3 Month Post-operative Score  ODI: 5 VAS back: 1 VAS leg: 2 SF-36:   -Physical functioning: 65  -Role limitations due to physical health: 100  -Role limitations due to emotional problems: 100  -Energy/fatigue: 40   -Emotional well-being: 84  -Social functioning: 75  -Pain: 77.5  -General health: 70  Pre-operative Scores   ODI: 66 VAS leg: 10 VAS back: 9 SF-36:             Physical functioning: 10             Role limitations due to physical health: 0             Role limitations due to emotional health: 100             Energy/fatigue: 30             Emotional well being: 60             Social functioning: 62.5             Pain: 10             General health: 35   London Sheer, MD Orthopedic Surgeon

## 2022-11-09 ENCOUNTER — Encounter: Payer: Self-pay | Admitting: Radiology

## 2023-11-20 ENCOUNTER — Ambulatory Visit: Admitting: Orthopedic Surgery

## 2023-11-20 ENCOUNTER — Other Ambulatory Visit (INDEPENDENT_AMBULATORY_CARE_PROVIDER_SITE_OTHER)

## 2023-11-20 DIAGNOSIS — Z981 Arthrodesis status: Secondary | ICD-10-CM

## 2023-11-20 MED ORDER — PREGABALIN 75 MG PO CAPS: 75.0000 mg | ORAL_CAPSULE | Freq: Two times a day (BID) | ORAL | 1 refills | Status: DC

## 2023-11-20 MED ORDER — TRAMADOL HCL 50 MG PO TABS: 50.0000 mg | ORAL_TABLET | Freq: Four times a day (QID) | ORAL | 0 refills | Status: AC | PRN

## 2023-11-20 NOTE — Progress Notes (Signed)
 Orthopedic Surgery Office Visit   Procedure: T10-pelvis PSIF, L2/3 XLIF, and T12-L3 laminectomies Date of Surgery: 08/09/2022 (~1.5 years post-op)   Assessment: Patient is a 73 y.o. female who has been having right leg pain for the last couple of months. It seems radicular in nature which would be unusual give her fusion spanning the lumbar spine. I do see some lucency around the cranial most screws so possible PJK     Plan: -No spine specific restrictions -Prescribed lyrica  and tramadol  to help with pain. Can continue with ibuprofen -If she is not doing any better at our next visit, will get a MRI to evaluate further -Return to office in 6 weeks, lumbar x-rays needed at next visit: scoliosis   ___________________________________________________________________________     Subjective: Patient had initially done well after surgery, but for the last 6 months or so, she has noticed pain in her right leg.  She says she feels it going into multiple different areas but the most consistent are in the lateral thigh and leg.  She does not have any pain going into the left lower extremity.  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  It is not as bad if she is at home or standing but is still present.  She feels the pain on a daily basis.  She has been using ibuprofen for the pain but has not done anything else.  She has a history of imbalance from myelopathy and had undergone previous ACDF.  She has not noticed any changes in her balance recently.  She is using a cane to help with the pain.  She is not interested in any kind of surgical management.  She said that is completely off the table. No saddle anesthesia. Denies bowel and bladder incontinence. Feels her legs are weaker when she is going upstairs, but has not noticed any other weakness. There was no trauma or injury that preceded the onset of this pain.     Objective:   General: no acute distress, appropriate  affect Neurologic: alert, answering questions appropriately, following commands Respiratory: unlabored breathing on room air Skin: Incisions are well-healed   MSK (spine):   -Strength exam                                                   Left                  Right   EHL                              4/5                  4/5 TA                                 5/5                  5/5 GSC                             5/5  5/5 Knee extension            5/5                  5/5 Hip flexion                    5/5                  5/5   -Sensory exam                           Sensation intact to light touch in L3-S1 nerve distributions of bilateral lower extremities  - Negative SLR bilaterally -No beats of clonus bilaterally -Negative Romberg -Negative heel-to-shin test   Imaging: X-rays of the lumbar spine taken 11/20/2023 were independently reviewed and interpreted, showing T10-pelvis posterior instrumentation.  There appears to be a subtle lucency at the T10 screws - seen best on the neutral and flexion views. No lucency seen at any of the other screws or around any of the interbody devices. None of the screws have backed out. No fracture or dislocation seen.      Patient name: Laura Solis Patient MRN: [id number] Date of visit: 11/20/23

## 2023-12-27 ENCOUNTER — Encounter: Payer: Self-pay | Admitting: Surgical

## 2023-12-27 ENCOUNTER — Other Ambulatory Visit (INDEPENDENT_AMBULATORY_CARE_PROVIDER_SITE_OTHER): Payer: Self-pay

## 2023-12-27 ENCOUNTER — Ambulatory Visit (INDEPENDENT_AMBULATORY_CARE_PROVIDER_SITE_OTHER): Admitting: Surgical

## 2023-12-27 VITALS — Ht 65.5 in | Wt 214.0 lb

## 2023-12-27 DIAGNOSIS — M25551 Pain in right hip: Secondary | ICD-10-CM | POA: Diagnosis not present

## 2023-12-27 DIAGNOSIS — M79671 Pain in right foot: Secondary | ICD-10-CM | POA: Diagnosis not present

## 2023-12-27 DIAGNOSIS — M25571 Pain in right ankle and joints of right foot: Secondary | ICD-10-CM

## 2023-12-27 NOTE — Progress Notes (Signed)
 Office Visit Note   Patient: Laura Solis           Date of Birth: 02/27/1951           MRN: [id number] Visit Date: 12/27/2023 Requested by: Favero, John Patrick, DO [address] [place],  [place] [address] PCP: Favero, John Patrick, DO  Subjective: Chief Complaint  Patient presents with   Right Hip - Pain    HPI: Laura Solis is a 73 y.o. female who presents to the office reporting right hip pain.  Patient has history of prior lumbar spine surgery per Dr. Sulema Endo on 08/09/2022.  She recently saw him in April and he felt that symptoms were likely not referred from lumbar spine.  She describes pain primarily in the posterior right buttock region as well as some occasional pain in the right groin.  Has increased pain with putting pressure on the gas pedal.  She has chronic right foot numbness that has been ongoing since before her lumbar spine surgery with Dr. Sulema Endo and this did not improve after surgery for any amount of time.  No such symptoms in her left leg.  She has no history of right hip surgery.  Has had her right knee replaced.  She will have radicular symptoms down the entirety of the right leg at times.  Vast majority of her pain is in the buttock region however.  It hurts to sit due to the focal buttock pain.  She ambulates with a cane.  Takes ibuprofen and tramadol  with some temporary relief.  She also describes right foot pain on the plantar aspect of the foot..                ROS: All systems reviewed are negative as they relate to the chief complaint within the history of present illness.  Patient denies fevers or chills.  Assessment & Plan: Visit Diagnoses:  1. Pain in right hip   2. Pain in right ankle and joints of right foot   3. Pain in right foot     Plan: Patient is a 73 year old female who has multiple joint complaints.  She has whole leg right leg radicular pain at times.  She has seen Dr. Sulema Endo who does not feel her hip pain is related to her back.  He has  discussed the possibility of getting MRI of the lumbar spine to look for any residual stenosis.  More symptomatic seems to be her hamstring weakness and tenderness over the ischial tuberosity which could demonstrate proximal hamstring tendinopathy as her major source of pain localizing to the buttock.  After discussion of options, she would like to try MRI of the pelvis for further evaluation of this problem.  Ordered MRI.  We will also try shockwave therapy of the right foot for plantar fasciitis with Dr. Vaughn Georges in [place].  If MRI of the pelvis is negative for any significant pathology, neck step would be MRI of the lumbar spine with follow-up with Dr. Sulema Endo versus diagnostic injection into the right hip joint under ultrasound guidance.  Follow-Up Instructions: No follow-ups on file.   Orders:  Orders Placed This Encounter  Procedures   XR HIP UNILAT W OR W/O PELVIS 2-3 VIEWS RIGHT   XR Foot Complete Right   XR Ankle Complete Right   MR Pelvis w/o contrast   AMB referral to sports medicine   No orders of the defined types were placed in this encounter.     Procedures: No procedures performed  Clinical Data: No additional findings.  Objective: Vital Signs: Ht 5' 5.5" (1.664 m)   Wt 214 lb (97.1 kg)   BMI 35.07 kg/m   Physical Exam:  Constitutional: Patient appears well-developed HEENT:  Head: Normocephalic Eyes:EOM are normal Neck: Normal range of motion Cardiovascular: Normal rate Pulmonary/chest: Effort normal Neurologic: Patient is alert Skin: Skin is warm Psychiatric: Patient has normal mood and affect  Ortho Exam: Ortho exam demonstrates intact hip flexion, quadricep, hamstring, dorsiflexion, plantarflexion strength equal bilaterally rated 5/5 with the exception of right sided hamstring strength rated 4/5 with reproduction of buttock pain.  She does have tenderness over the ischial tuberosity of the right pelvis moderately with no such tenderness over the left  ischial tuberosity.  No tenderness over the greater trochanter bilaterally.  Does not ambulate with Trendelenburg gait.  She has no tenderness over the SI joints bilaterally.  Right foot with palpable DP pulse.  Intact ankle dorsiflexion, plantarflexion, inversion, eversion with equivalent strength compared with contralateral side.  No significant atrophy noted.  Large callus noted on the lateral aspect of the right foot without cellulitis there is tenderness throughout the plantar fascia diffusely on the right foot asymmetric to the left.  No tenderness to the ankle joint line or medial/lateral malleoli.  Specialty Comments:  No specialty comments available.  Imaging: XR HIP UNILAT W OR W/O PELVIS 2-3 VIEWS RIGHT Result Date: 12/27/2023 AP and frog lateral views of pelvis reviewed.  No fracture or dislocation.  Hardware intact from prior lumbosacral fusion.  Mild degenerative changes of the right hip joint noted.  XR Foot Complete Right Result Date: 12/27/2023 AP, oblique, lateral views of right foot reviewed.  Osteophyte formation noted off the plantar and posterior calcaneus with calcification noted in the plantar fascia.  Large soft tissue callus noted near the fifth metatarsal base on the lateral aspect of the foot.  Os navicular noted  XR Ankle Complete Right Result Date: 12/27/2023 AP, oblique, lateral views of right ankle reviewed.  No fracture.  No significant degenerative changes noted of the right ankle.  There is some osteophytic formation of the plantar calcaneus.  Os trigonum noted.    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) 01/16/2018    Class: Chronic   S/P lumbar spinal fusion 01/16/2018   Impingement syndrome of left shoulder 12/14/2017   S/P lumbar fusion 01/26/2017   Postoperative urinary retention 01/11/2017   Spinal stenosis of lumbar region 01/09/2017   Restless legs 11/14/2016   Osteoarthritis 11/14/2016   Mixed anxiety and depressive disorder 11/14/2016   Attention deficit hyperactivity disorder 11/14/2016   Anxiety 11/14/2016   Adult attention deficit hyperactivity disorder 11/14/2016   Osteoarthritis of right knee 10/03/2012   Fibromyalgia syndrome 10/03/2012   Obesity (BMI 30.0-34.9) 10/03/2012   Past Medical History:  Diagnosis Date   ADHD (attention deficit hyperactivity disorder)    Anxiety    "from the fibromyalgia"   Chronic lower back pain    Fibromyalgia    Headache    "  bad one; monthly" (01/11/2017)   Osteoarthritis    "all over" (01/11/2017)   Pneumonia    "I've had it about 3 times" (01/11/2017)   PONV (postoperative nausea and vomiting)    Rotator cuff tear    left shoulder    History reviewed. No pertinent family history.  Past Surgical History:  Procedure Laterality Date   ANTERIOR CERVICAL DECOMP/DISCECTOMY FUSION N/A 04/13/2022   Procedure: CERVICAL THREE-FOUR, CERVICAL FOUR- FIVE ANTERIOR CERVICAL DISCECTOMY FUSION, ALLOGRAFT, PLATE;  Surgeon: Adah Acron, MD;  Location: [facility] OR;  Service: Orthopedics;  Laterality: N/A;   ANTERIOR LAT LUMBAR FUSION N/A 08/09/2022   Procedure: Lumbar two-three XLIF (EXTREME LATERAL INTERBODY FUSION);  Surgeon: Diedra Fowler, MD;  Location: [facility] OR;  Service: Orthopedics;  Laterality: N/A;   APPENDECTOMY     BACK SURGERY     CARPAL TUNNEL RELEASE Bilateral    CYST EXCISION     behind rt ear,coxxyx   CYSTECTOMY N/A    from spine.    DILATION AND CURETTAGE OF UTERUS     EYE SURGERY Bilateral 2016    cataracts   SHOULDER OPEN ROTATOR CUFF REPAIR Bilateral    SPINAL FUSION N/A 08/09/2022   Procedure: Thoracic ten- PELVIS POSTERIOR INSTRUMENTED SPINAL FUSION, Thoracic twelve- Lumbar three LAMINECTOMIES AND FORAMINOTOMIES;  Surgeon: Diedra Fowler, MD;  Location: [facility] OR;  Service: Orthopedics;  Laterality: N/A;   TONSILLECTOMY     TOTAL KNEE ARTHROPLASTY Right 10/02/2012   Procedure: TOTAL KNEE ARTHROPLASTY;  Surgeon: Shirlee Dotter, MD;  Location: [facility] OR;  Service: Orthopedics;  Laterality: Right;  Right Total Knee Arthroplasty   TOTAL KNEE ARTHROPLASTY Left 12/25/2019   TOTAL KNEE ARTHROPLASTY Left 12/25/2019   Procedure: LEFT TOTAL KNEE ARTHROPLASTY;  Surgeon: Adah Acron, MD;  Location: [facility] OR;  Service: Orthopedics;  Laterality: Left;   TRANSFORAMINAL LUMBAR INTERBODY FUSION (TLIF) WITH PEDICLE SCREW FIXATION 2 LEVEL Left 01/09/2017   L3-4, L4-5/notes 01/09/2017   TUBAL LIGATION     VAGINAL HYSTERECTOMY     Social History   Occupational History   Not on file  Tobacco Use   Smoking status: Never   Smokeless tobacco: Never  Vaping Use   Vaping status: Never Used  Substance and Sexual Activity   Alcohol use: No   Drug use: No   Sexual activity: Not Currently

## 2023-12-28 ENCOUNTER — Telehealth: Payer: Self-pay | Admitting: Orthopedic Surgery

## 2023-12-28 MED ORDER — TRAMADOL HCL 50 MG PO TABS: 50.0000 mg | ORAL_TABLET | Freq: Four times a day (QID) | ORAL | 0 refills | Status: AC | PRN

## 2023-12-28 NOTE — Telephone Encounter (Signed)
 Pt wanted to let Dr Sulema Endo know that the medications Pregabalin  & Tramadol  that he gave her to try does help and she needs a  Rx refill on the Tramadol       [facility] [address] in [place]

## 2024-01-08 ENCOUNTER — Telehealth: Payer: Self-pay | Admitting: Radiology

## 2024-01-08 NOTE — Telephone Encounter (Signed)
 Anyway we can see the images? I looked in [employer] and they aren't powershared yet at least

## 2024-01-08 NOTE — Telephone Encounter (Signed)
 Please see message from [place] office below and advise.  Report is available from [facility] in Care Everywhere.  I think you were going to look at it and decide on possible lumbar MRI with review with Dr. Sulema Endo.    She called checking on her MRI results.  I see they haven't come back yet and she would like for you to check on them and give her a call please.  Her number is [phone].

## 2024-01-10 ENCOUNTER — Encounter: Admitting: Sports Medicine

## 2024-01-11 ENCOUNTER — Telehealth: Payer: Self-pay | Admitting: Radiology

## 2024-01-11 NOTE — Telephone Encounter (Signed)
 Please see message from [place] office below. I have called [facility] to get them to push the images to [place]. You will have to enter [id number] where you would put the patient's name or MRN.

## 2024-01-15 NOTE — Telephone Encounter (Signed)
 noted

## 2024-01-15 NOTE — Telephone Encounter (Signed)
 I called patient to discuss MRI results.  She has partial-thickness right sided proximal hamstring rupture.  She also has fluid collection near the ischial bursa.  Recommended physical therapy for hamstring strengthening and stretching as well as potential aspiration of this fluid collection with Dr. Vaughn Georges.  She is going to hold off for now and has an appointment with another physician tomorrow that she is can discuss to see what they think.

## 2024-01-19 ENCOUNTER — Encounter: Payer: Self-pay | Admitting: Sports Medicine

## 2024-01-19 ENCOUNTER — Ambulatory Visit: Admitting: Sports Medicine

## 2024-01-19 DIAGNOSIS — M79671 Pain in right foot: Secondary | ICD-10-CM | POA: Diagnosis not present

## 2024-01-19 DIAGNOSIS — M722 Plantar fascial fibromatosis: Secondary | ICD-10-CM | POA: Diagnosis not present

## 2024-01-19 DIAGNOSIS — G8929 Other chronic pain: Secondary | ICD-10-CM

## 2024-01-19 DIAGNOSIS — M216X1 Other acquired deformities of right foot: Secondary | ICD-10-CM

## 2024-01-19 DIAGNOSIS — G5793 Unspecified mononeuropathy of bilateral lower limbs: Secondary | ICD-10-CM

## 2024-01-19 NOTE — Progress Notes (Signed)
 Laura Solis - 73 y.o. female MRN [id number]  Date of birth: [birth year]  Office Visit Note: Visit Date: 01/19/2024 PCP: Favero, John Patrick, DO Referred by: Casilda Clayman, PA-C  Subjective: Chief Complaint  Patient presents with   Right Foot - Pain   HPI: Laura Solis is a pleasant 73 y.o. female who presents today for chronic bilateral foot pain with right foot plantar fascia syndrome.  She has had pain in both feet for a long time.  She does have a history of fibromyalgia and believed it was due to this.  She also has lower extremity neuropathy mostly in the feet that is undisclosed the exact etiology.  She is a borderline diabetic.  Previously saw [facility], PA-C, who recommended she see me for evaluation and consideration of shockwave therapy.  She has pain in the heel that is worse after prolonged sitting or for steps in the morning.  She has used ice with temporary relief as well as some stretching.  She does have orthotics that she wears in her shoes but is wearing sandals today.  Pertinent ROS were reviewed with the patient and found to be negative unless otherwise specified above in HPI.   Assessment & Plan: Visit Diagnoses:  1. Chronic pain in right foot   2. Loss of transverse plantar arch of right foot   3. Plantar fascia syndrome   4. Neuropathy of both feet    Plan: Impression is chronic bilateral foot pain which is multifactorial in nature with history of fibromyalgia with underlying lower extremity neuropathy.  She does have evidence of plantar fascia syndrome of the right foot.  She also has significant loss of transverse arch and would benefit from metatarsal padding.  I will have her bring her orthotics into her next visit to modify these as see fits.  We did perform our first trial of extracorporeal shockwave therapy for the right plantar fascia and plantar foot, patient tolerated well.  I will see her back over the next 1-1.5 weeks for repeat shockwave  treatment and further evaluation of this and orthotic modification.  She may continue her Lyrica  75 mg twice daily for her radicular/neuropathy pain.  Follow-up: Return in about 10 days (around 01/29/2024) for f/u about 10 days for R-foot (SWT and gait analysis - 30-min_).   Meds & Orders: No orders of the defined types were placed in this encounter.  No orders of the defined types were placed in this encounter.    Procedures: Procedure: ECSWT Indications:  Chronic right foot pain, plantar fascia syndrome   Procedure Details Consent: Risks of procedure as well as the alternatives and risks of each were explained to the patient.  Verbal consent for procedure obtained. Time Out: Verified patient identification, verified procedure, site was marked, verified correct patient position. The area was cleaned with alcohol swab.     The right plantar fascia was targeted for Extracorporeal shockwave therapy.    Preset: Plantar fasciitis Power Level: 100 mJ Frequency: 10-11 Hz Impulse/cycles: 2500 Head size: Regular   Patient tolerated procedure well without immediate complications.       Clinical History: No specialty comments available.  She reports that she has never smoked. She has never used smokeless tobacco. No results for input(s): HGBA1C, LABURIC in the last 8760 hours.  Objective:    Physical Exam  Gen: Well-appearing, in no acute distress; non-toxic CV: Well-perfused. Warm.  Resp: Breathing unlabored on room air; no wheezing. Psych: Fluid speech in conversation;  appropriate affect; normal thought process  Ortho Exam - Bilateral feet: There is superficial skin manifestations.  There is significant callus formation over the plantar aspect of both feet over the plantar surface of the first MTP and at the heel.  Right foot tenderness near the medial band of the plantar fascia.  There is significant loss of transverse arch of the right greater than left foot.  There is preserved  longitudinal arch.  Early bunion deformity and hammertoe of the right third toe.  Imaging:  *I did independently review and interpret the right foot x-rays from 12/27/2023.  There is moderate plantar calcaneal spurs with osteophytic change and mild calcification over the plantar fascia location.  Early enthesophytes off the superior calcaneus as well as soft tissue callus noted.  There is preservation of longitudinal arch.  Early bunion deformity with mild midfoot arthritic change.  12/27/23: AP, oblique, lateral views of right foot reviewed.  Osteophyte formation  noted off the plantar and posterior calcaneus with calcification noted in  the plantar fascia.  Large soft tissue callus noted near the fifth  metatarsal base on the lateral aspect of the foot.  Os navicular noted   12/27/23: AP, oblique, lateral views of right ankle reviewed.  No fracture.  No  significant degenerative changes noted of the right ankle.  There is some  osteophytic formation of the plantar calcaneus.  Os trigonum noted.    Past Medical/Family/Surgical/Social History: Medications & Allergies reviewed per EMR, new medications updated. 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) 01/16/2018    Class: Chronic   S/P lumbar spinal fusion 01/16/2018   Impingement syndrome of left shoulder 12/14/2017   S/P lumbar fusion 01/26/2017   Postoperative urinary retention 01/11/2017   Spinal stenosis of lumbar region 01/09/2017   Restless legs 11/14/2016    Osteoarthritis 11/14/2016   Mixed anxiety and depressive disorder 11/14/2016   Attention deficit hyperactivity disorder 11/14/2016   Anxiety 11/14/2016   Adult attention deficit hyperactivity disorder 11/14/2016   Osteoarthritis of right knee 10/03/2012   Fibromyalgia syndrome 10/03/2012   Obesity (BMI 30.0-34.9) 10/03/2012   Past Medical History:  Diagnosis Date   ADHD (attention deficit hyperactivity disorder)    Anxiety    from the fibromyalgia   Chronic lower back pain    Fibromyalgia    Headache    bad one; monthly (01/11/2017)   Osteoarthritis    all over (01/11/2017)   Pneumonia    I've had it about 3 times (01/11/2017)   PONV (postoperative nausea and vomiting)    Rotator cuff tear    left shoulder   No family history on file. Past Surgical History:  Procedure Laterality Date   ANTERIOR CERVICAL DECOMP/DISCECTOMY FUSION N/A 04/13/2022   Procedure: CERVICAL THREE-FOUR, CERVICAL FOUR- FIVE ANTERIOR CERVICAL DISCECTOMY FUSION, ALLOGRAFT, PLATE;  Surgeon: Adah Acron, MD;  Location: [facility] OR;  Service: Orthopedics;  Laterality: N/A;   ANTERIOR LAT LUMBAR FUSION N/A 08/09/2022   Procedure: Lumbar two-three XLIF (EXTREME LATERAL INTERBODY FUSION);  Surgeon:  Diedra Fowler, MD;  Location: [facility] OR;  Service: Orthopedics;  Laterality: N/A;   APPENDECTOMY     BACK SURGERY     CARPAL TUNNEL RELEASE Bilateral    CYST EXCISION     behind rt ear,coxxyx   CYSTECTOMY N/A    from spine.    DILATION AND CURETTAGE OF UTERUS     EYE SURGERY Bilateral 2016   cataracts   SHOULDER OPEN ROTATOR CUFF REPAIR Bilateral    SPINAL FUSION N/A 08/09/2022   Procedure: Thoracic ten- PELVIS POSTERIOR INSTRUMENTED SPINAL FUSION, Thoracic twelve- Lumbar three LAMINECTOMIES AND FORAMINOTOMIES;  Surgeon: Diedra Fowler, MD;  Location: [facility] OR;  Service: Orthopedics;  Laterality: N/A;   TONSILLECTOMY     TOTAL KNEE ARTHROPLASTY Right 10/02/2012   Procedure: TOTAL KNEE ARTHROPLASTY;  Surgeon: Shirlee Dotter, MD;  Location: [facility] OR;  Service: Orthopedics;  Laterality: Right;  Right Total Knee Arthroplasty   TOTAL KNEE ARTHROPLASTY Left 12/25/2019   TOTAL KNEE ARTHROPLASTY Left 12/25/2019   Procedure: LEFT TOTAL KNEE ARTHROPLASTY;  Surgeon: Adah Acron, MD;  Location: [facility] OR;  Service: Orthopedics;  Laterality: Left;   TRANSFORAMINAL LUMBAR INTERBODY FUSION (TLIF) WITH PEDICLE SCREW FIXATION 2 LEVEL Left 01/09/2017   L3-4, L4-5/notes 01/09/2017   TUBAL LIGATION     VAGINAL HYSTERECTOMY     Social History   Occupational History   Not on file  Tobacco Use   Smoking status: Never   Smokeless tobacco: Never  Vaping Use   Vaping status: Never Used  Substance and Sexual Activity   Alcohol use: No   Drug use: No   Sexual activity: Not Currently

## 2024-01-19 NOTE — Progress Notes (Signed)
 Patient says that she has had right foot pain for a long time that has gotten progressively worse. She says that she has fibromyalgia, and for awhile thought her foot pain was coming from that. She has pain on the bottom of the foot that feels worse when she is on it for extended periods of time, and when she first gets out of bed in the morning. She has not taken medicine for the foot specifically, but she has used ice which gives her temporary relief. She says that she does gentle stretching and sometimes rolls her foot on a ball. She is here to try shockwave therapy today.

## 2024-01-26 ENCOUNTER — Encounter: Payer: Self-pay | Admitting: Sports Medicine

## 2024-01-26 ENCOUNTER — Ambulatory Visit: Admitting: Sports Medicine

## 2024-01-26 DIAGNOSIS — M216X1 Other acquired deformities of right foot: Secondary | ICD-10-CM | POA: Diagnosis not present

## 2024-01-26 DIAGNOSIS — G5793 Unspecified mononeuropathy of bilateral lower limbs: Secondary | ICD-10-CM

## 2024-01-26 DIAGNOSIS — Q6672 Congenital pes cavus, left foot: Secondary | ICD-10-CM

## 2024-01-26 DIAGNOSIS — M722 Plantar fascial fibromatosis: Secondary | ICD-10-CM

## 2024-01-26 NOTE — Progress Notes (Signed)
 RIDDHI GRETHER - 73 y.o. female MRN [id number]  Date of birth: [birth year]  Office Visit Note: Visit Date: 01/26/2024 PCP: Favero, John Patrick, DO Referred by: Favero, John Patrick, DO  Subjective: Chief Complaint  Patient presents with   Right Foot - Follow-up   HPI: TERRILL WAUTERS is a pleasant 73 y.o. female who presents today for follow-up of chronic bilateral foot pain with R-plantar fasciitis.  Minka had an excellent response to our first treatment of extracorporeal shockwave therapy.  She has had pain in her feet and heel for a long time and after her first treatment she feels at least 50-60% better.  She was been able to drive now.  Still has some pain in the heel but improved.  Using ice and heat as well as ibuprofen as needed.  She does continue her Lyrica  75 mg twice daily for her neuropathy.  Pertinent ROS were reviewed with the patient and found to be negative unless otherwise specified above in HPI.   Assessment & Plan: Visit Diagnoses:  1. Plantar fasciitis of right foot   2. Neuropathy of both feet   3. Pes cavus of both feet   4. Loss of transverse plantar arch of right foot    Plan: Impression is chronic bilateral foot pain which is multifactorial in nature with acute right foot plantar fasciitis.  She had excellent relief from extracorporeal shockwave therapy from our last visit, we did repeat this today.  She did bring in her orthotics which are more gel insoles and not giving her longitudinal arch support.  Did recommend a few over-the-counter treatment options that she may use.  We could consider putting metatarsal pads in these when she has a full-length supportive insole/orthotic.  She will continue her Lyrica  75 mg twice daily for her neuropathy.  Given her improvement, we will have her follow-up in the next 7-10 days for repeat shockwave therapy and then decide on future treatments at that time depending on her cumulative benefit.  Follow-up: Return in about 1  week (around 02/02/2024) for next 7-10 days for R-Plantar fasciitis.   Meds & Orders: No orders of the defined types were placed in this encounter.  No orders of the defined types were placed in this encounter.    Procedures: Procedure: ECSWT Indications:  Chronic right foot pain, plantar fascia syndrome   Procedure Details Consent: Risks of procedure as well as the alternatives and risks of each were explained to the patient.  Verbal consent for procedure obtained. Time Out: Verified patient identification, verified procedure, site was marked, verified correct patient position. The area was cleaned with alcohol swab.     The right plantar fascia was targeted for Extracorporeal shockwave therapy.    Preset: Plantar fasciitis Power Level: 100 mJ Frequency: 12 Hz Impulse/cycles: 2800 Head size: Regular   Patient tolerated procedure well without immediate complications.      Clinical History: No specialty comments available.  She reports that she has never smoked. She has never used smokeless tobacco. No results for input(s): HGBA1C, LABURIC in the last 8760 hours.  Objective:    Physical Exam  Gen: Well-appearing, in no acute distress; non-toxic CV: Well-perfused. Warm.  Resp: Breathing unlabored on room air; no wheezing. Psych: Fluid speech in conversation; appropriate affect; normal thought process  Ortho Exam - Bilateral feet: There is significant callus formation over the plantar aspect of bilateral feet underneath the surface of the first MTP the heel and the fifth metatarsal.  There is  tenderness to palpation over the medial band of the right plantar fascia although certainly improved from previous visit.  There is significant loss of transverse arch bilaterally on the right > left. Pes cavus noted.   Imaging: No results found.  Past Medical/Family/Surgical/Social History: Medications & Allergies reviewed per EMR, new medications updated. 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) 01/16/2018    Class: Chronic   S/P lumbar spinal fusion 01/16/2018   Impingement syndrome of left shoulder 12/14/2017   S/P lumbar fusion 01/26/2017   Postoperative urinary retention 01/11/2017   Spinal stenosis of lumbar region 01/09/2017   Restless legs 11/14/2016   Osteoarthritis 11/14/2016   Mixed anxiety and depressive disorder 11/14/2016   Attention deficit hyperactivity disorder 11/14/2016   Anxiety 11/14/2016   Adult attention deficit hyperactivity disorder 11/14/2016   Osteoarthritis of right knee 10/03/2012   Fibromyalgia syndrome 10/03/2012   Obesity (BMI 30.0-34.9) 10/03/2012   Past Medical History:  Diagnosis Date   ADHD (attention deficit hyperactivity disorder)    Anxiety    from the fibromyalgia   Chronic lower back pain    Fibromyalgia    Headache    bad one; monthly (01/11/2017)   Osteoarthritis    all over (01/11/2017)   Pneumonia    I've had it about 3 times (01/11/2017)   PONV (postoperative nausea and vomiting)    Rotator cuff tear    left shoulder   History reviewed. No pertinent family history. Past Surgical History:  Procedure Laterality Date   ANTERIOR CERVICAL DECOMP/DISCECTOMY FUSION N/A 04/13/2022   Procedure: CERVICAL THREE-FOUR, CERVICAL FOUR- FIVE ANTERIOR CERVICAL DISCECTOMY FUSION, ALLOGRAFT, PLATE;  Surgeon: Barbarann Oneil BROCKS, MD;  Location: [facility] OR;  Service: Orthopedics;  Laterality: N/A;   ANTERIOR LAT LUMBAR FUSION N/A 08/09/2022   Procedure: Lumbar  two-three XLIF (EXTREME LATERAL INTERBODY FUSION);  Surgeon: Georgina Ozell LABOR, MD;  Location[facility] OR;  Service: Orthopedics;  Laterality: N/A;   APPENDECTOMY     BACK SURGERY     CARPAL TUNNEL RELEASE Bilateral    CYST EXCISION     behind rt ear,coxxyx   CYSTECTOMY N/A    from spine.    DILATION AND CURETTAGE OF UTERUS     EYE SURGERY Bilateral 2016   cataracts   SHOULDER OPEN ROTATOR CUFF REPAIR Bilateral    SPINAL FUSION N/A 08/09/2022   Procedure: Thoracic ten- PELVIS POSTERIOR INSTRUMENTED SPINAL FUSION, Thoracic twelve- Lumbar three LAMINECTOMIES AND FORAMINOTOMIES;  Surgeon: Georgina Ozell LABOR, MD;  Location: [facility] OR;  Service: Orthopedics;  Laterality: N/A;   TONSILLECTOMY     TOTAL KNEE ARTHROPLASTY Right 10/02/2012   Procedure: TOTAL KNEE ARTHROPLASTY;  Surgeon: Maude LELON Right, MD;  Location: [facility] OR;  Service: Orthopedics;  Laterality: Right;  Right Total Knee Arthroplasty   TOTAL KNEE ARTHROPLASTY Left 12/25/2019   TOTAL KNEE ARTHROPLASTY Left 12/25/2019   Procedure: LEFT TOTAL KNEE ARTHROPLASTY;  Surgeon: Barbarann Oneil BROCKS, MD;  Location: [facility] OR;  Service: Orthopedics;  Laterality: Left;   TRANSFORAMINAL LUMBAR INTERBODY  FUSION (TLIF) WITH PEDICLE SCREW FIXATION 2 LEVEL Left 01/09/2017   L3-4, L4-5/notes 01/09/2017   TUBAL LIGATION     VAGINAL HYSTERECTOMY     Social History   Occupational History   Not on file  Tobacco Use   Smoking status: Never   Smokeless tobacco: Never  Vaping Use   Vaping status: Never Used  Substance and Sexual Activity   Alcohol use: No   Drug use: No   Sexual activity: Not Currently

## 2024-01-26 NOTE — Progress Notes (Signed)
 Pt is here for follow up for ESWT on 01/19/2024. She stated that she has had much relief since the treatment. On a scale she says that the pain is 50%-60% better and can now drive. Pt did state that today was the first day she had pain when driving to the office. She continues to use ice and heat to help with the pain and also is taking ibuprofen.

## 2024-02-05 ENCOUNTER — Ambulatory Visit: Admitting: Sports Medicine

## 2024-02-05 ENCOUNTER — Encounter: Payer: Self-pay | Admitting: Sports Medicine

## 2024-02-05 DIAGNOSIS — M722 Plantar fascial fibromatosis: Secondary | ICD-10-CM

## 2024-02-05 DIAGNOSIS — Q6672 Congenital pes cavus, left foot: Secondary | ICD-10-CM

## 2024-02-05 DIAGNOSIS — Q6671 Congenital pes cavus, right foot: Secondary | ICD-10-CM | POA: Diagnosis not present

## 2024-02-05 DIAGNOSIS — G5793 Unspecified mononeuropathy of bilateral lower limbs: Secondary | ICD-10-CM | POA: Diagnosis not present

## 2024-02-05 DIAGNOSIS — M216X1 Other acquired deformities of right foot: Secondary | ICD-10-CM

## 2024-02-05 NOTE — Progress Notes (Signed)
 Laura Solis - 73 y.o. female MRN [id number]  Date of birth: 01-18-1951  Office Visit Note: Visit Date: 02/05/2024 PCP: Favero, John Patrick, DO Referred by: Favero, John Patrick, DO  Subjective: Chief Complaint  Patient presents with   Right Foot - Follow-up   HPI: Laura Solis is a pleasant 73 y.o. female who presents today for follow-up of chronic bilateral foot pain with right plantar fasciitis and notable pes cavus.  She did have some increased soreness after our second treatment of extracorporeal shockwave therapy but after about 3 days her soreness resolved and she continued to notice benefit.  She is feeling much improved from both shockwave treatments.  She would like to proceed with additional today.  She is noticing more pain over the metatarsal region of the right > left foot.  She does continue her Lyrica  75 mg 1-2x daily.  She has not purchased orthotics, but is open to trialing these with metatarsal padding.  Pertinent ROS were reviewed with the patient and found to be negative unless otherwise specified above in HPI.   Assessment & Plan: Visit Diagnoses:  1. Plantar fasciitis of right foot   2. Neuropathy of both feet   3. Pes cavus of both feet   4. Loss of transverse plantar arch of right foot    Plan: Impression is chronic bilateral foot pain with active right foot plantar fasciitis.  She has received excellent relief from 2 sessions of extracorporeal shockwave therapy, we did repeat this today for the right foot and plantar fascia.  She does have neuropathy of both feet which is contributing, she will continue her Lyrica  75 mg once to twice daily.  May increase this to twice daily if needing further control.  She could be a candidate for Qutenza in the future for the neuropathy.  I would like to bring her back in the next 7-10 days for repeat shockwave treatment as well as fitting her for green sports insole orthotic with metatarsal padding to see if this offloads  her plantar forefoot pain.  Follow-up: Return in 7-10 days for right foot, orthotic eval  Meds & Orders: No orders of the defined types were placed in this encounter.  No orders of the defined types were placed in this encounter.    Procedures: Procedure: ECSWT Indications:  Chronic right foot pain, plantar fascia syndrome   Procedure Details Consent: Risks of procedure as well as the alternatives and risks of each were explained to the patient.  Verbal consent for procedure obtained. Time Out: Verified patient identification, verified procedure, site was marked, verified correct patient position. The area was cleaned with alcohol swab.     The right plantar fascia and underlying longitudinal arch was targeted for Extracorporeal shockwave therapy.    Preset: Plantar fasciitis Power Level: 100 mJ Frequency: 12 Hz Impulse/cycles: 2750  Head size: Regular   Patient tolerated procedure well without immediate complications.       Clinical History: No specialty comments available.  She reports that she has never smoked. She has never used smokeless tobacco. No results for input(s): HGBA1C, LABURIC in the last 8760 hours.  Objective:    Physical Exam  Gen: Well-appearing, in no acute distress; non-toxic CV: Well-perfused. Warm.  Resp: Breathing unlabored on room air; no wheezing. Psych: Fluid speech in conversation; appropriate affect; normal thought process  Ortho Exam - Bilateral feet: There is notable pes cavus with mild loss upon standing.  There is significant callus formation over the plantar  aspect of bilateral feet most notably over the first MTP and the lateral aspect of the fifth metatarsal.  There is loss of transverse arch bilaterally although the right foot is worse than the left.  There is improved tenderness over the insertion of the plantar fascia on the right foot today compared to previous visits.  Imaging: No results found.  Past  Medical/Family/Surgical/Social History: Medications & Allergies reviewed per EMR, new medications updated. 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) 01/16/2018    Class: Chronic   S/P lumbar spinal fusion 01/16/2018   Impingement syndrome of left shoulder 12/14/2017   S/P lumbar fusion 01/26/2017   Postoperative urinary retention 01/11/2017   Spinal stenosis of lumbar region 01/09/2017   Restless legs 11/14/2016   Osteoarthritis 11/14/2016   Mixed anxiety and depressive disorder 11/14/2016   Attention deficit hyperactivity disorder 11/14/2016   Anxiety 11/14/2016   Adult attention deficit hyperactivity disorder 11/14/2016   Osteoarthritis of right knee 10/03/2012   Fibromyalgia syndrome 10/03/2012   Obesity (BMI 30.0-34.9) 10/03/2012   Past Medical History:  Diagnosis Date   ADHD (attention deficit hyperactivity disorder)    Anxiety    from the fibromyalgia   Chronic lower back pain    Fibromyalgia    Headache    bad one; monthly (01/11/2017)   Osteoarthritis    all over (01/11/2017)   Pneumonia    I've had it about 3 times (01/11/2017)   PONV (postoperative nausea and vomiting)    Rotator cuff tear    left shoulder   History reviewed. No pertinent family history. Past Surgical History:  Procedure Laterality Date   ANTERIOR CERVICAL DECOMP/DISCECTOMY FUSION N/A 04/13/2022   Procedure: CERVICAL THREE-FOUR, CERVICAL FOUR- FIVE ANTERIOR CERVICAL DISCECTOMY FUSION, ALLOGRAFT, PLATE;  Surgeon: Barbarann Oneil BROCKS, MD;  Location: [facility] OR;  Service: Orthopedics;  Laterality: N/A;   ANTERIOR LAT LUMBAR FUSION N/A 08/09/2022   Procedure: Lumbar two-three XLIF (EXTREME LATERAL INTERBODY FUSION);  Surgeon: Georgina Ozell LABOR, MD;  Location: [facility] OR;  Service: Orthopedics;  Laterality: N/A;   APPENDECTOMY     BACK SURGERY     CARPAL TUNNEL RELEASE Bilateral    CYST EXCISION     behind rt ear,coxxyx   CYSTECTOMY N/A    from spine.    DILATION AND CURETTAGE OF UTERUS     EYE SURGERY Bilateral 2016   cataracts   SHOULDER OPEN ROTATOR CUFF REPAIR Bilateral    SPINAL FUSION N/A 08/09/2022   Procedure: Thoracic ten- PELVIS POSTERIOR INSTRUMENTED SPINAL FUSION, Thoracic twelve- Lumbar three LAMINECTOMIES AND FORAMINOTOMIES;  Surgeon: Georgina Ozell LABOR, MD;  Location: [facility] OR;  Service: Orthopedics;  Laterality: N/A;   TONSILLECTOMY     TOTAL KNEE ARTHROPLASTY Right 10/02/2012   Procedure: TOTAL KNEE ARTHROPLASTY;  Surgeon: Maude LELON Right, MD;  Location: [facility] OR;  Service: Orthopedics;  Laterality: Right;  Right Total Knee Arthroplasty   TOTAL KNEE ARTHROPLASTY Left 12/25/2019   TOTAL KNEE ARTHROPLASTY Left 12/25/2019   Procedure: LEFT TOTAL KNEE ARTHROPLASTY;  Surgeon: Barbarann Oneil BROCKS, MD;  Location: [facility] OR;  Service: Orthopedics;  Laterality: Left;   TRANSFORAMINAL LUMBAR INTERBODY FUSION (TLIF) WITH PEDICLE SCREW FIXATION 2 LEVEL Left 01/09/2017   L3-4, L4-5/notes 01/09/2017   TUBAL LIGATION     VAGINAL HYSTERECTOMY     Social History   Occupational History   Not on file  Tobacco Use   Smoking status: Never   Smokeless tobacco: Never  Vaping Use   Vaping status: Never Used  Substance and Sexual Activity   Alcohol use: No   Drug use: No   Sexual activity: Not Currently

## 2024-02-05 NOTE — Progress Notes (Signed)
 Patient says that she had worse, and longer lasting pain after the second shockwave therapy than she did the first, but after about 3 days that did improve and she feels overall better than she did prior to the first two shockwave treatments. She still notices it most when she drives, but says that this pain is overall much improved. She says that she has gotten a sleeve for the heel, and that the compression does help. She has not yet found an insole that gives her much relief. She has continued taking the Lyrica .

## 2024-02-13 ENCOUNTER — Ambulatory Visit: Admitting: Sports Medicine

## 2024-02-13 ENCOUNTER — Encounter: Payer: Self-pay | Admitting: Sports Medicine

## 2024-02-13 DIAGNOSIS — M722 Plantar fascial fibromatosis: Secondary | ICD-10-CM

## 2024-02-13 DIAGNOSIS — M216X1 Other acquired deformities of right foot: Secondary | ICD-10-CM | POA: Diagnosis not present

## 2024-02-13 DIAGNOSIS — M216X2 Other acquired deformities of left foot: Secondary | ICD-10-CM

## 2024-02-13 DIAGNOSIS — G5793 Unspecified mononeuropathy of bilateral lower limbs: Secondary | ICD-10-CM | POA: Diagnosis not present

## 2024-02-13 DIAGNOSIS — Q6671 Congenital pes cavus, right foot: Secondary | ICD-10-CM

## 2024-02-13 NOTE — Progress Notes (Signed)
 Patient reports of feeling better. Patient reports of their heel being sore and pain underneath the toes when walking. She takes pregabalin  and Ibuprofen which are helping. She is also alternating heat and ice.

## 2024-02-13 NOTE — Progress Notes (Signed)
 Laura Solis - 73 y.o. female MRN [id number]  Date of birth: [birth year]  Office Visit Note: Visit Date: 02/13/2024 PCP: Favero, John Patrick, DO Referred by: Favero, John Patrick, DO  Subjective: Chief Complaint  Patient presents with   Right Foot - Follow-up   HPI: Laura Solis is a pleasant 73 y.o. female who presents today for right foot plantar fasciitis and bilateral chronic foot pain with neuropathy.  Dayle has noticed significant improvement in her heel/plantar fascia pain, feels like this is at least 75% improved.  She is dealing with more of her neuropathy pain and pain over the forefoot near her metatarsals.  She is interested in trialing orthotics, she has had over-the-counter firm orthotics in the past but did not appreciate the benefit from these.  She does continue with her Lyrica  75 mg once to twice daily.  She does have a history of diabetes, was previously on GLP-1 injection therapy which reduced her A1c.  Pertinent ROS were reviewed with the patient and found to be negative unless otherwise specified above in HPI.   Assessment & Plan: Visit Diagnoses:  1. Plantar fasciitis of right foot   2. Neuropathy of both feet   3. Pes cavus of both feet   4. Loss of transverse plantar arch of right foot   5. Loss of transverse plantar arch of left foot    Plan: Impression is chronic bilateral foot pain with concomitant right foot plantar fasciitis which has improved significantly after a few treatments of extracorporeal shockwave therapy.  We did repeat shockwave treatment for the plantar fascia and underlying plantar foot, tolerated well.  She does have both biomechanical pain from her significant pes cavus as well as underlying neuropathy of both feet.  We did fit her for green sports insoles today with both scaphoid and metatarsal padding.  Patient noticed immediate improvement in comfort and these with improvement in her gait.  She will wear these for the next 2-3 weeks to  see what sort of relief she obtains.  She will continue her Lyrica  75 mg once to twice daily as needed for her neuropathy pain.  We will see what sort of relief she gets from her orthotic insole correction, could be a candidate for Qutenza in the future for her neuropathy, we will keep this in mind.  I will see her back in 3 weeks for reevaluation.  Follow-up: Return in about 3 weeks (around 03/05/2024) for b/l feet.   Meds & Orders: No orders of the defined types were placed in this encounter.  No orders of the defined types were placed in this encounter.    Procedures: Procedure: ECSWT Indications:  Chronic right foot pain, plantar fascia syndrome   Procedure Details Consent: Risks of procedure as well as the alternatives and risks of each were explained to the patient.  Verbal consent for procedure obtained. Time Out: Verified patient identification, verified procedure, site was marked, verified correct patient position. The area was cleaned with alcohol swab.     The right plantar fascia and underlying longitudinal arch was targeted for Extracorporeal shockwave therapy.    Preset: Plantar fasciitis Power Level: 110 mJ   Frequency: 12-14 Hz Impulse/cycles: 2800 Head size: Regular   Patient tolerated procedure well without immediate complications.      Clinical History: No specialty comments available.  She reports that she has never smoked. She has never used smokeless tobacco. No results for input(s): HGBA1C, LABURIC in the last 8760 hours.  Objective:  Physical Exam  Gen: Well-appearing, in no acute distress; non-toxic CV: Well-perfused. Warm.  Resp: Breathing unlabored on room air; no wheezing. Psych: Fluid speech in conversation; appropriate affect; normal thought process  Ortho Exam - Bilateral feet: There is notable pes cavus feet bilaterally.  There is loss of longitudinal arch bilaterally although the right is greater than left.  There is significant callus  formation over the plantar aspect of both feet both on the first MTP and underneath the metatarsal of the fifth.  There is improved tenderness but very mild TTP at the medial band of the plantar fashion.  There is hammertoe deformity of the third right toe.  Imaging: No results found.  Past Medical/Family/Surgical/Social History: Medications & Allergies reviewed per EMR, new medications updated. 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) 01/16/2018    Class: Chronic   S/P lumbar spinal fusion 01/16/2018   Impingement syndrome of left shoulder 12/14/2017   S/P lumbar fusion 01/26/2017   Postoperative urinary retention 01/11/2017   Spinal stenosis of lumbar region 01/09/2017   Restless legs 11/14/2016   Osteoarthritis 11/14/2016   Mixed anxiety and depressive disorder 11/14/2016   Attention deficit hyperactivity disorder 11/14/2016   Anxiety 11/14/2016   Adult attention deficit hyperactivity disorder 11/14/2016   Osteoarthritis of right knee 10/03/2012   Fibromyalgia syndrome 10/03/2012   Obesity (BMI 30.0-34.9) 10/03/2012   Past Medical History:  Diagnosis Date   ADHD (attention deficit hyperactivity disorder)    Anxiety    from the fibromyalgia   Chronic lower back pain    Fibromyalgia    Headache    bad one; monthly (01/11/2017)   Osteoarthritis    all over (01/11/2017)   Pneumonia    I've had it about 3 times (01/11/2017)   PONV (postoperative nausea and vomiting)    Rotator cuff tear    left shoulder    No family history on file. Past Surgical History:  Procedure Laterality Date   ANTERIOR CERVICAL DECOMP/DISCECTOMY FUSION N/A 04/13/2022   Procedure: CERVICAL THREE-FOUR, CERVICAL FOUR- FIVE ANTERIOR CERVICAL DISCECTOMY FUSION, ALLOGRAFT, PLATE;  Surgeon: Barbarann Oneil BROCKS, MD;  Location: [facility] OR;  Service: Orthopedics;  Laterality: N/A;   ANTERIOR LAT LUMBAR FUSION N/A 08/09/2022   Procedure: Lumbar two-three XLIF (EXTREME LATERAL INTERBODY FUSION);  Surgeon: Georgina Ozell LABOR, MD;  Location: [facility] OR;  Service: Orthopedics;  Laterality: N/A;   APPENDECTOMY     BACK SURGERY     CARPAL TUNNEL RELEASE Bilateral    CYST EXCISION     behind rt ear,coxxyx   CYSTECTOMY N/A    from spine.    DILATION AND CURETTAGE OF UTERUS     EYE SURGERY Bilateral 2016   cataracts   SHOULDER OPEN ROTATOR CUFF REPAIR Bilateral    SPINAL FUSION N/A 08/09/2022   Procedure: Thoracic ten- PELVIS POSTERIOR INSTRUMENTED SPINAL FUSION, Thoracic twelve- Lumbar three LAMINECTOMIES AND FORAMINOTOMIES;  Surgeon: Georgina Ozell LABOR, MD;  Location: [facility] OR;  Service: Orthopedics;  Laterality: N/A;   TONSILLECTOMY  TOTAL KNEE ARTHROPLASTY Right 10/02/2012   Procedure: TOTAL KNEE ARTHROPLASTY;  Surgeon: Maude LELON Right, MD;  Location: [facility] OR;  Service: Orthopedics;  Laterality: Right;  Right Total Knee Arthroplasty   TOTAL KNEE ARTHROPLASTY Left 12/25/2019   TOTAL KNEE ARTHROPLASTY Left 12/25/2019   Procedure: LEFT TOTAL KNEE ARTHROPLASTY;  Surgeon: Barbarann Oneil BROCKS, MD;  Location: [facility] OR;  Service: Orthopedics;  Laterality: Left;   TRANSFORAMINAL LUMBAR INTERBODY FUSION (TLIF) WITH PEDICLE SCREW FIXATION 2 LEVEL Left 01/09/2017   L3-4, L4-5/notes 01/09/2017   TUBAL LIGATION     VAGINAL HYSTERECTOMY     Social History   Occupational History   Not on file  Tobacco Use   Smoking status: Never   Smokeless tobacco: Never  Vaping Use   Vaping status: Never Used  Substance and Sexual Activity   Alcohol use: No   Drug use: No    Sexual activity: Not Currently

## 2024-03-05 ENCOUNTER — Ambulatory Visit: Admitting: Sports Medicine

## 2024-03-24 ENCOUNTER — Other Ambulatory Visit: Payer: Self-pay | Admitting: Orthopedic Surgery

## 2024-05-07 ENCOUNTER — Ambulatory Visit: Admitting: Sports Medicine

## 2024-05-29 ENCOUNTER — Other Ambulatory Visit: Payer: Self-pay | Admitting: Orthopedic Surgery

## 2024-06-03 ENCOUNTER — Encounter: Payer: Self-pay | Admitting: Radiology
